# Patient Record
Sex: Female | Born: 1970
Health system: Southern US, Community
[De-identification: ages and names within clinical notes are randomized; demographics above are authoritative.]

## PROBLEM LIST (undated history)

## (undated) DIAGNOSIS — G43909 Migraine, unspecified, not intractable, without status migrainosus: Secondary | ICD-10-CM

## (undated) DIAGNOSIS — F32A Depression, unspecified: Secondary | ICD-10-CM

## (undated) DIAGNOSIS — F329 Major depressive disorder, single episode, unspecified: Secondary | ICD-10-CM

## (undated) DIAGNOSIS — K649 Unspecified hemorrhoids: Secondary | ICD-10-CM

## (undated) DIAGNOSIS — K219 Gastro-esophageal reflux disease without esophagitis: Secondary | ICD-10-CM

## (undated) DIAGNOSIS — F419 Anxiety disorder, unspecified: Secondary | ICD-10-CM

## (undated) DIAGNOSIS — M1990 Unspecified osteoarthritis, unspecified site: Secondary | ICD-10-CM

## (undated) HISTORY — DX: Unspecified osteoarthritis, unspecified site: M19.90

## (undated) HISTORY — DX: Migraine, unspecified, not intractable, without status migrainosus: G43.909

## (undated) HISTORY — DX: Depression, unspecified: F32.A

## (undated) HISTORY — DX: Major depressive disorder, single episode, unspecified: F32.9

## (undated) HISTORY — PX: CERVICAL FUSION: SHX112

## (undated) HISTORY — DX: Unspecified hemorrhoids: K64.9

## (undated) HISTORY — DX: Gastro-esophageal reflux disease without esophagitis: K21.9

## (undated) HISTORY — DX: Anxiety disorder, unspecified: F41.9

---

## 2000-01-29 HISTORY — PX: TUBAL LIGATION: SHX77

## 2002-01-28 HISTORY — PX: INCONTINENCE SURGERY: SHX676

## 2004-04-18 ENCOUNTER — Ambulatory Visit: Payer: Self-pay | Admitting: Unknown Physician Specialty

## 2004-09-09 ENCOUNTER — Emergency Department: Payer: Self-pay | Admitting: Emergency Medicine

## 2005-02-04 ENCOUNTER — Ambulatory Visit: Payer: Self-pay | Admitting: Pain Medicine

## 2005-03-28 ENCOUNTER — Ambulatory Visit: Payer: Self-pay | Admitting: Pain Medicine

## 2005-03-28 ENCOUNTER — Emergency Department: Payer: Self-pay | Admitting: Internal Medicine

## 2005-04-10 ENCOUNTER — Ambulatory Visit: Payer: Self-pay | Admitting: Physician Assistant

## 2005-04-18 ENCOUNTER — Ambulatory Visit: Payer: Self-pay | Admitting: Pain Medicine

## 2005-05-09 ENCOUNTER — Ambulatory Visit: Payer: Self-pay | Admitting: Physician Assistant

## 2005-05-28 ENCOUNTER — Ambulatory Visit: Payer: Self-pay | Admitting: Pain Medicine

## 2005-06-12 ENCOUNTER — Ambulatory Visit: Payer: Self-pay | Admitting: Pain Medicine

## 2005-06-25 ENCOUNTER — Ambulatory Visit: Payer: Self-pay | Admitting: Pain Medicine

## 2005-07-08 ENCOUNTER — Ambulatory Visit: Payer: Self-pay | Admitting: Pain Medicine

## 2005-07-15 ENCOUNTER — Ambulatory Visit: Payer: Self-pay | Admitting: Pain Medicine

## 2005-08-12 ENCOUNTER — Ambulatory Visit: Payer: Self-pay | Admitting: Pain Medicine

## 2007-10-06 ENCOUNTER — Ambulatory Visit: Payer: Self-pay

## 2010-01-28 LAB — HM PAP SMEAR

## 2011-10-15 ENCOUNTER — Ambulatory Visit (INDEPENDENT_AMBULATORY_CARE_PROVIDER_SITE_OTHER): Payer: BC Managed Care – PPO | Admitting: Family Medicine

## 2011-10-15 ENCOUNTER — Encounter: Payer: Self-pay | Admitting: *Deleted

## 2011-10-15 ENCOUNTER — Encounter: Payer: Self-pay | Admitting: Family Medicine

## 2011-10-15 VITALS — BP 120/74 | HR 74 | Temp 98.4°F | Ht 64.25 in | Wt 198.0 lb

## 2011-10-15 DIAGNOSIS — G43109 Migraine with aura, not intractable, without status migrainosus: Secondary | ICD-10-CM

## 2011-10-15 DIAGNOSIS — Z23 Encounter for immunization: Secondary | ICD-10-CM

## 2011-10-15 DIAGNOSIS — R413 Other amnesia: Secondary | ICD-10-CM

## 2011-10-15 MED ORDER — PROMETHAZINE HCL 25 MG PO TABS: 12.5000 mg | ORAL_TABLET | Freq: Three times a day (TID) | ORAL | Status: DC | PRN

## 2011-10-15 MED ORDER — CYCLOBENZAPRINE HCL 10 MG PO TABS: 10.0000 mg | ORAL_TABLET | Freq: Three times a day (TID) | ORAL | Status: DC | PRN

## 2011-10-15 NOTE — Patient Instructions (Signed)
Take the phenergan and flexeril at the onset of a migraine (both can make you drowsy).  Use the imitrex if needed.   We'll be in touch after I can review your old records.   Take care.

## 2011-10-17 ENCOUNTER — Encounter: Payer: Self-pay | Admitting: Family Medicine

## 2011-10-17 ENCOUNTER — Telehealth: Payer: Self-pay | Admitting: Family Medicine

## 2011-10-17 DIAGNOSIS — R413 Other amnesia: Secondary | ICD-10-CM | POA: Insufficient documentation

## 2011-10-17 DIAGNOSIS — G43109 Migraine with aura, not intractable, without status migrainosus: Secondary | ICD-10-CM | POA: Insufficient documentation

## 2011-10-17 NOTE — Assessment & Plan Note (Signed)
D/w pt about smoking and caffeine, needs to quit both.  Will add on phenergan for nausea and flexeril if she starts to have a migraine.  Use imitrex if not relieved.  She understood.  Continue current meds o/w. >30 min spent with face to face with patient, >50% counseling and/or coordinating care. Will review old records.

## 2011-10-17 NOTE — Telephone Encounter (Signed)
Call pt.  She had a neg pap in 2012.  She needs further labs for the memory changes.  I'll await those.  If wnl, then we'll need to consider if the memory changes are related to migraines or other cause.  I would set up a visit after the labs are done so we can discuss the memory changes in detail.  I may need to get extra help for her, ie refer locally to neuro.  We can talk about this at the OV.  Prev MRI brain wnl in old records.

## 2011-10-17 NOTE — Progress Notes (Signed)
New pt to est care. Migraines, started years ago.  +Aura, photo/phonophobia, NAV, throbbing.  On proph meds with prn imitrex.  ~6 migraines a month.  Smokes, drinks caffeine daily.   Mild memory changes per patient.  Still driving, working, but she didn't know the cause for this.  See plan.    Sig social upheaval.  Father is ill, 1st husband died.  Currently separated from second husband.  3 kids.    PMH and SH reviewed  ROS: See HPI, otherwise noncontributory.  Meds, vitals, and allergies reviewed.   GEN: nad, alert and oriented HEENT: mucous membranes moist NECK: supple w/o LA CV: rrr.  no murmur PULM: ctab, no inc wob ABD: soft, +bs EXT: no edema SKIN: no acute rash CN 2-12 wnl B, S/S/DTR wnl x4

## 2011-10-17 NOTE — Addendum Note (Signed)
Addended by: Joaquim Nam on: 10/17/2011 08:02 AM   Modules accepted: Orders

## 2011-10-17 NOTE — Telephone Encounter (Signed)
Patient advised and scheduled for labs on Friday at 10:00.  Please order labs if not already done.

## 2011-10-17 NOTE — Assessment & Plan Note (Addendum)
A&O, will review old records and then contact patient.  She agrees.   Addendum- With prev neg MRI brain and recent TSH/CMET/CBC wnl.  Would check B12/folate/RPR.  If wnl, consider other cause- migraine related, pseudodementia, etc.  We may need to refer to neuro.

## 2011-10-18 ENCOUNTER — Other Ambulatory Visit (INDEPENDENT_AMBULATORY_CARE_PROVIDER_SITE_OTHER): Payer: BC Managed Care – PPO

## 2011-10-18 DIAGNOSIS — R413 Other amnesia: Secondary | ICD-10-CM

## 2011-10-18 NOTE — Telephone Encounter (Signed)
Already ordered

## 2011-10-19 LAB — RPR

## 2011-10-21 ENCOUNTER — Encounter: Payer: Self-pay | Admitting: *Deleted

## 2011-10-23 ENCOUNTER — Encounter: Payer: Self-pay | Admitting: Family Medicine

## 2011-10-23 ENCOUNTER — Ambulatory Visit (INDEPENDENT_AMBULATORY_CARE_PROVIDER_SITE_OTHER): Payer: BC Managed Care – PPO | Admitting: Family Medicine

## 2011-10-23 VITALS — BP 128/66 | HR 71 | Temp 97.4°F | Wt 201.2 lb

## 2011-10-23 DIAGNOSIS — G43109 Migraine with aura, not intractable, without status migrainosus: Secondary | ICD-10-CM

## 2011-10-23 DIAGNOSIS — R413 Other amnesia: Secondary | ICD-10-CM

## 2011-10-23 NOTE — Assessment & Plan Note (Signed)
ddx includes ADD, depression, fatigue.  Primary memory disorder is less likely.  I'd like neuro input esp given the migraines that aren't improving.  She never had formal ADD testing and this may be useful (ie if neuro agrees with need for neuropsychological testing). ddx d/w pt.  She agrees with plan.  No change in meds now.  >25 min spent with face to face with patient, >50% counseling and/or coordinating care

## 2011-10-23 NOTE — Assessment & Plan Note (Signed)
Not improved, would refer to neuro.

## 2011-10-23 NOTE — Progress Notes (Signed)
Possibly some dec in pain with flexeril and promethazine with migraines, but this isn't clear to patient.  She has continued on her other prev meds.    Memory changes first noted in the last 12 months.  She'll have to write notes to herself to compensate.  She has locked her keys in her car accidentally.  She has trouble remembering where she put her bank card.  She got lost going to [facility] yesterday (history per patient- pt was on the way to the hospital to see her dying sister, who possibly had a CVA after a fracture).  Prev MRI/MRA unremarkable per old records.  She isn't making mistakes at work.  She is worried about her memory.   She's had mult speeding tickets over the years.  Has insomnia related to worry about completing tasks.  Has to use lists to compensate.  Concentration and memory are worse per patient when she is fatigued.  She can't drive with the radio on as it is distracting.    Meds, vitals, and allergies reviewed.   ROS: See HPI.  Otherwise, noncontributory.  MMSE 26/30.  -3 for concentration, -1 for recall.   nad Affect- slightly worried, fatigued appearing Speech and judgement wnl Remainder of exam deferred.

## 2011-10-23 NOTE — Patient Instructions (Addendum)
Don't change your meds for now.  Shirlee Limerick will call about your referral.

## 2011-11-15 ENCOUNTER — Ambulatory Visit: Payer: Self-pay | Admitting: Neurology

## 2011-11-15 ENCOUNTER — Encounter: Payer: Self-pay | Admitting: Family Medicine

## 2011-11-15 ENCOUNTER — Ambulatory Visit (INDEPENDENT_AMBULATORY_CARE_PROVIDER_SITE_OTHER): Payer: BC Managed Care – PPO | Admitting: Family Medicine

## 2011-11-15 VITALS — BP 112/74 | HR 82 | Temp 98.3°F | Wt 203.0 lb

## 2011-11-15 DIAGNOSIS — J069 Acute upper respiratory infection, unspecified: Secondary | ICD-10-CM

## 2011-11-15 DIAGNOSIS — G43109 Migraine with aura, not intractable, without status migrainosus: Secondary | ICD-10-CM

## 2011-11-15 DIAGNOSIS — R413 Other amnesia: Secondary | ICD-10-CM

## 2011-11-15 NOTE — Patient Instructions (Addendum)
Gargle with salt water and take plain guaifenesin for your cold if needed.  Don't take anything with -D or -DM in the name.   I'll await the neuro notes.   Take care.

## 2011-11-15 NOTE — Progress Notes (Signed)
She had an EEG today via neuro- Dr. Malvin Johns. I don't have the results yet.  Dr. Malvin Johns wanted pt on trazodone (now with some improvement of sleep using trazodone) with zonegran with OTC butterbur 75mg  twice a day for migraines.  She has f/u with neuro in another 2 weeks.   Has had 4 "bad" migraines this month already.  Using imitrex prn, along with flexeril and promethazine.    She has talked to her manager about her situation at work.  She is having trouble with concentration and it isn't clear if the above symptoms are related.   Recently with mild ST.  Daughter has a cold. No fevers. No cough.   Meds, vitals, and allergies reviewed.   ROS: See HPI.  Otherwise, noncontributory.  GEN: nad, alert and oriented HEENT: mucous membranes moist, tm w/o erythema, nasal exam w/o erythema, scant clear discharge noted,  OP with cobblestoning NECK: supple w/o LA CV: rrr.   PULM: ctab, no inc wob EXT: no edema SKIN: no acute rash

## 2011-11-16 ENCOUNTER — Telehealth: Payer: Self-pay | Admitting: Family Medicine

## 2011-11-16 DIAGNOSIS — J069 Acute upper respiratory infection, unspecified: Secondary | ICD-10-CM | POA: Insufficient documentation

## 2011-11-16 NOTE — Assessment & Plan Note (Signed)
I'll await the neuro recs.  It appears that butterbur would be reasonable to try.  I'll defer to neuro.

## 2011-11-16 NOTE — Assessment & Plan Note (Signed)
Likely viral, nontoxic, supportive tx in meantime.

## 2011-11-16 NOTE — Telephone Encounter (Signed)
Call pt.  It would be reasonable to try the butterbur per neuro recs for the headaches.  Thanks.

## 2011-11-16 NOTE — Assessment & Plan Note (Signed)
I'll await neuro results.  It is unclear if she truly has concentration/memory changes as a primary problem of if this is a secondary symptom to another condition.

## 2011-11-18 NOTE — Telephone Encounter (Signed)
Patient advised.

## 2011-12-30 ENCOUNTER — Encounter: Payer: Self-pay | Admitting: Family Medicine

## 2011-12-30 ENCOUNTER — Ambulatory Visit (INDEPENDENT_AMBULATORY_CARE_PROVIDER_SITE_OTHER): Payer: BC Managed Care – PPO | Admitting: Family Medicine

## 2011-12-30 VITALS — BP 102/64 | HR 78 | Temp 98.2°F | Wt 199.0 lb

## 2011-12-30 DIAGNOSIS — R413 Other amnesia: Secondary | ICD-10-CM

## 2011-12-30 DIAGNOSIS — G43109 Migraine with aura, not intractable, without status migrainosus: Secondary | ICD-10-CM

## 2011-12-30 NOTE — Progress Notes (Signed)
She has had some improvement in headaches since she is on butterbur per neuro recs with less HA overall.   She still has some concern about confusion/memory.  She attributes it to the zonegran.  She is asking about options for alternatives.    She had prev been topamax, started at high dose.  She reports prev being on BB, but that was years ago.  Lyrica didn't help and neither did neurontin.  Depakote didn't help and she had sig weight gain.    HA were worse on lower dose of zonegran but she felt better overall and "like the fog had lifted."    Meds, vitals, and allergies reviewed.   ROS: See HPI.  Otherwise, noncontributory.  nad ncat Mmm rrr ctab Speech wnl Motor function grossly wnl x4 Gait wnl

## 2011-12-30 NOTE — Patient Instructions (Addendum)
Let me think about the options for your meds and I'll be in touch.

## 2011-12-31 ENCOUNTER — Encounter: Payer: Self-pay | Admitting: *Deleted

## 2011-12-31 ENCOUNTER — Telehealth: Payer: Self-pay | Admitting: Family Medicine

## 2011-12-31 MED ORDER — TOPIRAMATE 25 MG PO TABS: ORAL_TABLET | ORAL | Status: DC

## 2011-12-31 NOTE — Telephone Encounter (Signed)
Patient advised.  She also asked me to send these instructions to her in letter form.  Letter mailed.  She asks that the Topamax be sent to [facility] [address] in [place].

## 2011-12-31 NOTE — Assessment & Plan Note (Signed)
See migraine discussion.  

## 2011-12-31 NOTE — Telephone Encounter (Signed)
LM at work for her to return my call.

## 2011-12-31 NOTE — Assessment & Plan Note (Signed)
At this point would start and then increase topamax by 25mg  every 2 weeks until migraines controlled, max 200mg  a day.  Would dec zonisamide by 100mg  every 2 weeks.  Will take 12 weeks to get off zonisamide.

## 2011-12-31 NOTE — Telephone Encounter (Signed)
Was sent to [facility]. Thanks.

## 2011-12-31 NOTE — Telephone Encounter (Signed)
Call pt.  At this point would start and then increase topamax by 25mg  every 2 weeks until migraines controlled, max 200mg  a day.  Don't go up quicker than that.  Would concurrently decrease zonisamide by 100mg  every 2 weeks.  Don't decrease quicker than that.  Will take 12 weeks to get off zonisamide.    Note that topamax is pregnancy category D, ie notify MD before considering pregnancy to discuss options.  Have her call back with update in about 1 month, sooner if need.  Thanks.

## 2012-02-07 ENCOUNTER — Encounter: Payer: Self-pay | Admitting: Family Medicine

## 2012-02-07 ENCOUNTER — Ambulatory Visit (INDEPENDENT_AMBULATORY_CARE_PROVIDER_SITE_OTHER): Payer: BC Managed Care – PPO | Admitting: Family Medicine

## 2012-02-07 VITALS — BP 104/70 | HR 68 | Temp 98.5°F | Wt 195.8 lb

## 2012-02-07 DIAGNOSIS — M549 Dorsalgia, unspecified: Secondary | ICD-10-CM

## 2012-02-07 LAB — POCT URINALYSIS DIPSTICK
Bilirubin, UA: NEGATIVE
Blood, UA: NEGATIVE
Glucose, UA: NEGATIVE
Ketones, UA: NEGATIVE
Spec Grav, UA: 1.02
pH, UA: 6.5

## 2012-02-07 NOTE — Patient Instructions (Addendum)
This looks like a pulled muscle.  Take tylenol and flexeril as needed.  Use a heating pad.  Take care.

## 2012-02-07 NOTE — Progress Notes (Signed)
She is in the midst of topamax up titration and zonegran dec titration.  She doesn't have frequent migraines and her memory seems to be improving.  She has more energy and is sleeping better.   Now with back pain on R side, radiating around the R side into the lateral abdomen near the RUQ but not in the RUQ itself.  U/a is unremarkable today.  No FCVD.  Started ~5 days ago.  She initially thought she slept wrong, then attributed it to constipation.  Some nausea.  No dysuria.  No blood in stool.  No L sided sx. Still has GB, never had renal stones.    Meds, vitals, and allergies reviewed.   ROS: See HPI.  Otherwise, noncontributory.  nad ncat Mmm rrr ctab Back w/o midline pain No rash abd soft, not ttp, no RUQ pain, murphy's neg Back with R sided paraspinal muscle tenderness w/o midline pain.  No cva pain Ext w/o edema

## 2012-02-09 DIAGNOSIS — M5416 Radiculopathy, lumbar region: Secondary | ICD-10-CM | POA: Insufficient documentation

## 2012-02-09 NOTE — Assessment & Plan Note (Signed)
Looks to be MSK and not abdominal.  Likely muscle strain.  Take flexeril, local heat, tylenol and f/u prn.  Anatomy d/w pt. She agrees.

## 2012-02-28 ENCOUNTER — Telehealth: Payer: Self-pay | Admitting: Family Medicine

## 2012-02-28 MED ORDER — VALACYCLOVIR HCL 1 G PO TABS: 2000.0000 mg | ORAL_TABLET | Freq: Two times a day (BID) | ORAL | Status: DC

## 2012-02-28 NOTE — Telephone Encounter (Signed)
Sent. Thanks.   

## 2012-02-28 NOTE — Telephone Encounter (Signed)
Pt requesting rx for Valtrex for herpes flare up to be sent to  [facility], [place].

## 2012-04-08 ENCOUNTER — Other Ambulatory Visit: Payer: Self-pay

## 2012-04-08 MED ORDER — TOPIRAMATE 100 MG PO TABS: 100.0000 mg | ORAL_TABLET | Freq: Two times a day (BID) | ORAL | Status: DC

## 2012-04-08 MED ORDER — ELETRIPTAN HYDROBROMIDE 40 MG PO TABS: 40.0000 mg | ORAL_TABLET | ORAL | Status: DC | PRN

## 2012-04-08 NOTE — Telephone Encounter (Signed)
Pt left v/m requesting refill topamax to [facility]. Pt said presently she is taking topamax 100 mg twice a day. Pt said has taken Imitrex when does get migraine but usually has to take more than one Imitrex and pt would like to try Relpax to [facility].Please advise.

## 2012-04-08 NOTE — Telephone Encounter (Signed)
I changed the rx to 100mg  pills of topamax.  That should be easier to take.  rx sent for that and relpax.  Call back with update if not effective.  Thanks.

## 2012-04-24 ENCOUNTER — Other Ambulatory Visit: Payer: Self-pay | Admitting: *Deleted

## 2012-04-24 ENCOUNTER — Encounter: Payer: Self-pay | Admitting: Family Medicine

## 2012-04-24 ENCOUNTER — Ambulatory Visit (INDEPENDENT_AMBULATORY_CARE_PROVIDER_SITE_OTHER): Payer: BC Managed Care – PPO | Admitting: Family Medicine

## 2012-04-24 VITALS — BP 110/70 | HR 79 | Temp 98.3°F | Wt 196.5 lb

## 2012-04-24 DIAGNOSIS — F329 Major depressive disorder, single episode, unspecified: Secondary | ICD-10-CM

## 2012-04-24 DIAGNOSIS — F3289 Other specified depressive episodes: Secondary | ICD-10-CM

## 2012-04-24 DIAGNOSIS — G43109 Migraine with aura, not intractable, without status migrainosus: Secondary | ICD-10-CM

## 2012-04-24 DIAGNOSIS — F32A Depression, unspecified: Secondary | ICD-10-CM

## 2012-04-24 MED ORDER — CITALOPRAM HYDROBROMIDE 20 MG PO TABS: 20.0000 mg | ORAL_TABLET | Freq: Every day | ORAL | Status: DC

## 2012-04-24 NOTE — Telephone Encounter (Signed)
[facility] called problems with computer earlier; Medication given verbally to pharmacy as instructed.

## 2012-04-24 NOTE — Progress Notes (Signed)
HA- tapered off zonegran and now up on topamax.  She is less foggy but still leaving herself notes as reminders.  She has the tingling as expected in her hands. Migraines are under fair control, having about 2-3 per month.  If she catches the HA early on, "then they aren't as bad."  She realized that she may be getting rebound HA from ibuprofen. This doesn't seem to happen as much with aleve.  She's having to take it episodically during the week.    Husband has been driving long distances and has been out of the house for extended periods; this has been a significant change.  She's in the middle of a house foreclosure, mult family members have been ill.  She is still functional at work.    More tearful.  Sleep isn't restorative.  Restless leg sx have increased; prev dx'd during sleep study. No SI/HI.  She doesn't want to go back through counseling.    Meds, vitals, and allergies reviewed.   ROS: See HPI.  Otherwise, noncontributory.  nad ncat Mmm rrr ctab CN 2-12 wnl B, S/S/DTR wnl x4 Speech and judgement intact

## 2012-04-24 NOTE — Telephone Encounter (Signed)
Patient given info at appt.

## 2012-04-24 NOTE — Patient Instructions (Signed)
Add on the citalopram and see if that helps your mood and the headaches.  If not tolerated or if not improving, then let us know.

## 2012-04-24 NOTE — Telephone Encounter (Signed)
Resent

## 2012-04-26 DIAGNOSIS — F32A Depression, unspecified: Secondary | ICD-10-CM | POA: Insufficient documentation

## 2012-04-26 DIAGNOSIS — F329 Major depressive disorder, single episode, unspecified: Secondary | ICD-10-CM | POA: Insufficient documentation

## 2012-04-26 NOTE — Assessment & Plan Note (Signed)
Improved with topamax.  Tolerating the med except for expected tingling in her hands.  Continue as is.

## 2012-04-26 NOTE — Assessment & Plan Note (Signed)
With depressed mood, likely exacerbated by concurrent life events. Prev intolerant of prozac.  Would be reasonable to try another SSRI for depressive sx and for migraine proph potential. She agrees. Okay for outpatient f/u. >25 min spent with face to face with patient, >50% counseling and/or coordinating care. Routine instructions given and understood by patient.

## 2012-05-22 ENCOUNTER — Other Ambulatory Visit: Payer: Self-pay

## 2012-05-22 NOTE — Telephone Encounter (Signed)
Pt left note requesting refill valtrex to [facility].Please advise.

## 2012-05-23 ENCOUNTER — Other Ambulatory Visit: Payer: Self-pay | Admitting: Family Medicine

## 2012-05-24 MED ORDER — VALACYCLOVIR HCL 1 G PO TABS: 2000.0000 mg | ORAL_TABLET | Freq: Two times a day (BID) | ORAL | Status: DC

## 2012-05-24 NOTE — Telephone Encounter (Signed)
Sent!

## 2012-05-25 ENCOUNTER — Other Ambulatory Visit: Payer: Self-pay | Admitting: *Deleted

## 2012-05-25 MED ORDER — VALACYCLOVIR HCL 1 G PO TABS: 2000.0000 mg | ORAL_TABLET | Freq: Two times a day (BID) | ORAL | Status: AC

## 2012-05-28 ENCOUNTER — Encounter: Payer: Self-pay | Admitting: Family Medicine

## 2012-05-28 ENCOUNTER — Ambulatory Visit (INDEPENDENT_AMBULATORY_CARE_PROVIDER_SITE_OTHER): Payer: BC Managed Care – PPO | Admitting: Family Medicine

## 2012-05-28 VITALS — BP 108/66 | HR 74 | Temp 98.9°F | Wt 196.5 lb

## 2012-05-28 DIAGNOSIS — R51 Headache: Secondary | ICD-10-CM

## 2012-05-28 DIAGNOSIS — G43109 Migraine with aura, not intractable, without status migrainosus: Secondary | ICD-10-CM

## 2012-05-28 MED ORDER — KETOROLAC TROMETHAMINE 30 MG/ML IJ SOLN
30.0000 mg | Freq: Once | INTRAMUSCULAR | Status: AC
  Administered 2012-05-28: 30 mg via INTRAMUSCULAR

## 2012-05-28 MED ORDER — PROMETHAZINE HCL 25 MG/ML IJ SOLN
25.0000 mg | Freq: Once | INTRAMUSCULAR | Status: AC
  Administered 2012-05-28: 25 mg via INTRAMUSCULAR

## 2012-05-28 MED ORDER — KETOROLAC TROMETHAMINE 30 MG/ML IJ SOLN: 30.0000 mg | Freq: Once | INTRAMUSCULAR | Status: DC

## 2012-05-28 NOTE — Assessment & Plan Note (Signed)
Going on for days.  Has a driver here.  No acute/emergent neuro changes.  Given IM phenergan and toradol.  Continue phenergan later today orally at home.  She'll update [place] tomorrow if needed.  We can consider pred trial if not improved.  Will await update from patient.  She agrees.

## 2012-05-28 NOTE — Patient Instructions (Addendum)
Get some rest at home in a dark room and let me know tomorrow if the injections today didn't help. I would continue to take the phenergan every 8 hours at least for today.  Take care.

## 2012-05-28 NOTE — Progress Notes (Signed)
Overall she was improved off zonisamide and on topamax.  Her HA- this episode- has been waxing and waning for a few days now. She hasn't been able to sleep.  Throbbing HA, "like drums pounding".  Vomiting. Aura noted. Used home meds, flexeril and relpax, on topamax.  Used phenergan. Last vomited about 1AM today.  Has a driver here today.  Last ate and held the food down last night. Last relpax dose was last night about 10 PM.  Photo/phonophobia noted. Overly sensitive to smells. She hasn't smoked in a few days.  The sx are typical for a migraine but worse than normal. Hasn't taken any nsaids today.    Meds, vitals, and allergies reviewed.   ROS: See HPI.  Otherwise, noncontributory.  GEN: nad, alert and oriented but look uncomfortable.  HEENT: mucous membranes moist, TM wnl, nasal and op exam wnl, PERRL, EOMI NECK: supple w/o LA CV: rrr.  PULM: ctab, no inc wob ABD: soft, +bs EXT: no edema SKIN: no acute rash CN 2-12 wnl B, S/S/DTR wnl x4

## 2012-06-05 ENCOUNTER — Encounter: Payer: Self-pay | Admitting: Family Medicine

## 2012-06-05 ENCOUNTER — Ambulatory Visit (INDEPENDENT_AMBULATORY_CARE_PROVIDER_SITE_OTHER): Payer: BC Managed Care – PPO | Admitting: Family Medicine

## 2012-06-05 VITALS — BP 102/64 | HR 63 | Temp 98.7°F | Wt 194.0 lb

## 2012-06-05 DIAGNOSIS — G43109 Migraine with aura, not intractable, without status migrainosus: Secondary | ICD-10-CM

## 2012-06-05 DIAGNOSIS — G43909 Migraine, unspecified, not intractable, without status migrainosus: Secondary | ICD-10-CM

## 2012-06-05 NOTE — Progress Notes (Signed)
Migraine f/u.  Seen last Thursday.  She improved but it took ~3 days from the OV to notice a big change "when the fog lifted."  The pain started to improve by the day after the OV.  She doesn't see much improvement from the flexeril early in HA course.  She started to have another HA last night. She took a flexeril but she didn't improve then.  It was unclear if the flexeril made the HA worse or if she was going to get worse anyway.  She is having some breakthrough HA in spite of her current meds.  She's had to take aleve at least 3 times this week. 3 per week appears to be her new baseline.  The foggy sensation is some better on the topamax compared to the zonisamide.  She notes that she'll occ get "tongue tied" and this is newer since she was on the topamax.    She has stopped working.  She was laid off 05/13/12.  She is considering her options and taking some odd jobs.   Meds, vitals, and allergies reviewed.   ROS: See HPI.  Otherwise, noncontributory.  GEN: nad, alert and oriented HEENT: mucous membranes moist NECK: supple w/o LA CV: rrr PULM: ctab, no inc wob ABD: soft, +bs EXT: no edema SKIN: no acute rash CN 2-12 wnl B, S/S/DTR wnl x4

## 2012-06-05 NOTE — Patient Instructions (Addendum)
Go to the lab on the way out.  We'll contact you with your lab report.  Stop the flexeril for now.  We'll be in touch about the topamax dose after I see your labs.   Take care.

## 2012-06-06 LAB — COMPREHENSIVE METABOLIC PANEL
AST: 12 U/L (ref 0–37)
Albumin: 4.1 g/dL (ref 3.5–5.2)
BUN: 19 mg/dL (ref 6–23)
CO2: 24 mEq/L (ref 19–32)
Calcium: 9 mg/dL (ref 8.4–10.5)
Chloride: 108 mEq/L (ref 96–112)
Glucose, Bld: 80 mg/dL (ref 70–99)
Potassium: 4.4 mEq/L (ref 3.5–5.3)

## 2012-06-07 ENCOUNTER — Other Ambulatory Visit: Payer: Self-pay | Admitting: Family Medicine

## 2012-06-07 MED ORDER — TOPIRAMATE 25 MG PO TABS: ORAL_TABLET | ORAL | Status: DC

## 2012-06-07 NOTE — Assessment & Plan Note (Signed)
Will inc topamax to 225 then 250mg  a day to see if that will help. She'll avoid use of flexeril for now and use triptan prn for HA. She agrees.  See notes on labs.  >25 min spent with face to face with patient, >50% counseling and/or coordinating care.

## 2012-08-13 ENCOUNTER — Other Ambulatory Visit: Payer: Self-pay | Admitting: *Deleted

## 2012-08-13 MED ORDER — CITALOPRAM HYDROBROMIDE 20 MG PO TABS: 20.0000 mg | ORAL_TABLET | Freq: Every day | ORAL | Status: DC

## 2012-09-12 ENCOUNTER — Other Ambulatory Visit: Payer: Self-pay | Admitting: Family Medicine

## 2012-09-14 NOTE — Telephone Encounter (Signed)
Electronic refill request.  Please advise. 

## 2012-09-14 NOTE — Telephone Encounter (Signed)
Sent!

## 2012-10-22 ENCOUNTER — Encounter: Payer: Self-pay | Admitting: Family Medicine

## 2012-10-22 ENCOUNTER — Ambulatory Visit (INDEPENDENT_AMBULATORY_CARE_PROVIDER_SITE_OTHER): Payer: BC Managed Care – PPO | Admitting: Family Medicine

## 2012-10-22 VITALS — BP 110/70 | HR 77 | Temp 98.4°F | Ht 64.25 in | Wt 204.5 lb

## 2012-10-22 DIAGNOSIS — F32A Depression, unspecified: Secondary | ICD-10-CM

## 2012-10-22 DIAGNOSIS — G43901 Migraine, unspecified, not intractable, with status migrainosus: Secondary | ICD-10-CM

## 2012-10-22 DIAGNOSIS — F329 Major depressive disorder, single episode, unspecified: Secondary | ICD-10-CM

## 2012-10-22 DIAGNOSIS — G43109 Migraine with aura, not intractable, without status migrainosus: Secondary | ICD-10-CM

## 2012-10-22 DIAGNOSIS — F3289 Other specified depressive episodes: Secondary | ICD-10-CM

## 2012-10-22 MED ORDER — PROMETHAZINE HCL 25 MG PO TABS: 25.0000 mg | ORAL_TABLET | Freq: Four times a day (QID) | ORAL | Status: DC | PRN

## 2012-10-22 MED ORDER — AMITRIPTYLINE HCL 25 MG PO TABS: 25.0000 mg | ORAL_TABLET | Freq: Every day | ORAL | Status: DC

## 2012-10-22 NOTE — Progress Notes (Signed)
Nature conservation officer at [facility] [address] [place] [address] Phone: [phone] Fax: [phone]  Date:  10/22/2012   Name:  Lesly Pontarelli   DOB:  05-26-1970   MRN:  [id number] Gender: female Age: 42 y.o.  Primary Physician:  Crawford Givens, MD  Evaluating MD: Hannah Beat, MD   Chief Complaint: Migraine   History of Present Illness:  Emberleigh Reily is a 42 y.o. pleasant patient who presents with the following:  This is the first time I have evaluated the patient who is a patient of Dr. Para March and this marks her 9th office visit for evaluation and treatment of migraine in the last 12 months.  She has an extensive migraine history since childhood and is currently taking Topamax 125 mg bid. She is having financial difficulties right now, has lost her insurance, and has been unable to get Relpax. She does have 2 more injectable doses of triptan left.  Migraine: migraines have gotten out of hand some. Has had 3-4 migraines a month with severe headache, nausea, photophobia, and phonophobia, and they will last anywhere from 4-7 days each. During this time, she is hardly able to leave a dark room. In total from about 12-21 days of the month she is terribly debilitated and stays in a dark room.  In the last month, her face has been hurting. Now feels like face is moving. Eyes were moving. Forehead moving, feels like face is moving. Then having. Light and sound are bothering her a lot. Touching her head is sensitive. Hurts to brush her hair.   Her mother has trigeminal neuralgia, and she worries that she has been developing this.   She has had extensive evaluations in the past with MRI's, MRA's, and she has been seen by [facility] neurology, Dr. Malvin Johns at the [facility], and the [facility] in [place]. She was unable to tolerate Depakote, she failed titrations of Inderal, reports no help from TCA's and flexeril.   Patient Active Problem List   Diagnosis Date Noted  . Depression 04/26/2012  . Back pain 02/09/2012  . Migraine with aura 10/17/2011  . Memory changes 10/17/2011    Past Medical History  Diagnosis Date  . Depression   . Migraines     with aura    Past Surgical History  Procedure Laterality Date  . Cervical fusion      History   Social History  . Marital Status: Married    Spouse Name: N/A    Number of Children: 3  . Years of Education: N/A   Occupational History  . CMA    Social History Main Topics  . Smoking status: Current Every Day Smoker -- 0.10 packs/day for 3 years    Types: Cigarettes  . Smokeless tobacco: Never Used  . Alcohol Use: No  . Drug Use: No  . Sexual Activity: Not on file   Other Topics Concern  . Not on file   Social History Narrative   2 years of college.   Former CMA at [employer]   1st husband died from [employer] in 2010/06/13   Remarried    3 kids    Family History  Problem Relation Age of Onset  . Peripheral vascular disease Father   . Stroke Sister     possible CVA    Allergies  Allergen Reactions  . Lyrica [Pregabalin]     Migraine  . Prozac [Fluoxetine Hcl]     Fatigue   . Remeron [Mirtazapine]     "  felt like a zombie"    Medication list has been reviewed and updated.  Outpatient Prescriptions Prior to Visit  Medication Sig Dispense Refill  . citalopram (CELEXA) 20 MG tablet Take 1 tablet (20 mg total) by mouth daily.  30 tablet  3  . Cranberry 125 MG TABS Take 1 tablet by mouth as needed.       . promethazine (PHENERGAN) 25 MG tablet Take 0.5-1 tablets (12.5-25 mg total) by mouth every 8 (eight) hours as needed for nausea.  30 tablet  2  . topiramate (TOPAMAX) 100 MG tablet Take 1 tablet (100 mg total) by mouth 2 (two) times daily.  60 tablet  12  . topiramate (TOPAMAX) 25 MG tablet Take 2 a day (along with 2x100mg  tabs) for 250mg  per day total.  60 tablet  5  . valACYclovir (VALTREX) 1000 MG tablet Take 2 tablets (2,000 mg total) by mouth 2 (two)  times daily. For 1 day.  Repeat as needed with future episodes.  8 tablet  1  . topiramate (TOPAMAX) 25 MG tablet Take 1 tab a day for 2 weeks, then increase to 2 a day.  For total of 225 and then 250mg  a day.      . cyclobenzaprine (FLEXERIL) 10 MG tablet Take 1 tablet (10 mg total) by mouth 3 (three) times daily as needed (migraine- sedation caution).  30 tablet  2  . eletriptan (RELPAX) 40 MG tablet One tablet by mouth at onset of headache. May repeat in 2 hours if headache persists or recurs.  10 tablet  2   No facility-administered medications prior to visit.    Review of Systems:  Anxious, depressed somwhat, neuro symptoms as above. No fever, chills, or chest pain. O/w as above  Physical Examination: BP 110/70  Pulse 77  Temp(Src) 98.4 F (36.9 C) (Oral)  Ht 5' 4.25" (1.632 m)  Wt 204 lb 8 oz (92.761 kg)  BMI 34.83 kg/m2  LMP 10/12/2012  Ideal Body Weight: Weight in (lb) to have BMI = 25: 146.5   GEN: WDWN, NAD, Non-toxic, A & O x 3 HEENT: Atraumatic, Normocephalic. Neck supple. No masses, No LAD. Ears and Nose: No external deformity. CV: RRR, No M/G/R. No JVD. No thrill. No extra heart sounds. PULM: CTA B, no wheezes, crackles, rhonchi. No retractions. No resp. distress. No accessory muscle use. ABD: S, NT, ND, +BS. No rebound tenderness. No HSM.  EXTR: No c/c/e  Neuro: CN 2-12 grossly intact. PERRLA. EOMI. Sensation intact throughout. Str 5/5 all extremities. DTR 2+. No clonus. A and o x 4. Romberg neg. Finger nose neg. Heel -shin neg.   PSYCH: anxious. Mildly labile affect.  Assessment and Plan:  Migraine with status migrainosus - Plan: Ambulatory referral to Neurology  Migraine with aura  Depression  >75 minutes spent in face to face time with patient, >50% spent in counselling or coordination of care:  This patient is more unstable from a migraine standpoint than almost anyone I have ever examined. She has already tried far more than I ever do in my clinical  practice and has seen several neurologists. She is spending 20/30 days in a dark room in pain. I discussed this case with one of partners, as well, and we came up with few options. I thought a trial of a TCA may help with a different mechanism for prophylaxis, but she has already called this morning and was unable to tolerate this. I have no other suggestions and think this is well  beyond the scope of my care. I am asking my colleagues in Neurology to see if they will graciously help.  I will discuss the patient with her PCP when he returns. SSRI increase and potentially addition of benzo may be helpful if patient is willing.  Orders Today:  Orders Placed This Encounter  Procedures  . Ambulatory referral to Neurology    Referral Priority:  Routine    Referral Type:  Consultation    Referral Reason:  Specialty Services Required    Requested Specialty:  Neurology    Number of Visits Requested:  1    Updated Medication List: (Includes new medications, updates to list, dose adjustments) Meds ordered this encounter  Medications  . amitriptyline (ELAVIL) 25 MG tablet    Sig: Take 1 tablet (25 mg total) by mouth at bedtime.    Dispense:  30 tablet    Refill:  4  . promethazine (PHENERGAN) 25 MG tablet    Sig: Take 1 tablet (25 mg total) by mouth every 6 (six) hours as needed for nausea.    Dispense:  40 tablet    Refill:  3    Medications Discontinued: Medications Discontinued During This Encounter  Medication Reason  . topiramate (TOPAMAX) 25 MG tablet Duplicate      Signed, Indea Dearman T. Jazari Ober, MD 10/22/2012 11:11 AM

## 2012-10-22 NOTE — Patient Instructions (Addendum)
REFERRAL: GO THE THE FRONT ROOM AT THE ENTRANCE OF OUR CLINIC, NEAR CHECK IN. ASK FOR Bridget Beck. SHE WILL HELP YOU SET UP YOUR REFERRAL. DATE: TIME:  

## 2012-10-23 ENCOUNTER — Other Ambulatory Visit: Payer: Self-pay | Admitting: Family Medicine

## 2012-10-23 ENCOUNTER — Telehealth: Payer: Self-pay | Admitting: Family Medicine

## 2012-10-23 ENCOUNTER — Telehealth: Payer: Self-pay

## 2012-10-23 NOTE — Telephone Encounter (Signed)
I will d/w Dr. Patsy Lager upon my return.  I had discussed mult options with patient prev, including referral (she declined specialty referral prev as she was transitioning out of [facility] to [facility]).  Pt had not updated me about her condition after the 06/07/12 lab result.

## 2012-10-23 NOTE — Telephone Encounter (Signed)
Patient notified as instructed by telephone. 

## 2012-10-23 NOTE — Telephone Encounter (Signed)
Stop the amitryptiline.   As we discussed yesterday, she is already above the FDA approved dose of topamax for migraine, and I will not increase it.   We are trying to get her into neurology as soon as possible.

## 2012-10-23 NOTE — Telephone Encounter (Signed)
Pt seen 10/22/12; pt stated had reaction to amitriptyline; "pt was disfunctional and pt went blind with migraine". Pt said she could not see anything for 2 hours.Pt said head is very sore, it hurts to touch her head, light sensitive and nauseated. Pt is not taking any more amitriptyline. Pt request increase of Topamax.[employer].Pt request cb. In the past pt has taken Zrongram 600 mg.Zrongram became ineffective after 6 years. Stopped Zrongram and Topamax was started.

## 2012-10-23 NOTE — Telephone Encounter (Signed)
I called and got the advice of Dr. Arbutus Leas about this patient. She confirmed that doses > 200 mg of Topamax are not helpful with migraine prophylaxis. I asked her advice about options for this patient and she suggested trying Zanaflex or potentially cymbalta.   I thought this was a very good suggestion, and I called the patient to check on her. I suggested adding zanaflex, and the patient was not open to this suggestion.  She would like to discuss this with Dr. Para March. I will discuss this case with my partner on his return.  Hannah Beat, MD 10/23/2012, 10:59 AM

## 2012-10-25 ENCOUNTER — Telehealth: Payer: Self-pay | Admitting: Family Medicine

## 2012-10-25 NOTE — Telephone Encounter (Signed)
Please call pt. I d/w Dr. Patsy Lager.  I agree with Dr. Cyndie Chime plan re: neuro eval.  Would taper back down to topamax 200mg  a day.  If I had an update from the patient in the meantime about her worsening headaches, I already would have referred out and decreased the topamax back to 200mg  a day.

## 2012-10-26 NOTE — Telephone Encounter (Signed)
I would follow the prev instructions.  See below.

## 2012-10-26 NOTE — Telephone Encounter (Signed)
Pt left v/m requesting cb; pt said she needs her topamax adjusted; pt was seen in ED over weekend for h/a, was given cocktail and that helped h/a somewhat, pt's eye and cheek is twitching also.

## 2012-10-27 NOTE — Telephone Encounter (Signed)
I didn't mean to confuse the instructions.   I assumed she had stopped the amitriptyline.  I would stay off that.  I still wouldn't increase the topamax.  I would only take 200mg  a day and f/u with neuro when possible.

## 2012-10-27 NOTE — Telephone Encounter (Signed)
Patient advised.   She says she cannot take the Amitriptyline.  Patient says that after taking just one tablet, it sent her into a horrendous migraine and she ended up in the ER.  She says she does think that her Topamax needs to be adjusted one more time because she is having more headaches.   Please advise.

## 2012-10-27 NOTE — Telephone Encounter (Signed)
Patient was informed that the neurology office had tried to phone her with no answer.  The neurology # (Dr. Arbutus Leas) was given to patient [phone]

## 2012-12-03 ENCOUNTER — Other Ambulatory Visit: Payer: Self-pay

## 2012-12-16 ENCOUNTER — Other Ambulatory Visit: Payer: Self-pay | Admitting: Family Medicine

## 2012-12-16 NOTE — Telephone Encounter (Signed)
Received refill request electronically. Last office visit 10/22/12/acute. Is it okay to refill medication?

## 2012-12-16 NOTE — Telephone Encounter (Signed)
Sent!

## 2013-03-09 ENCOUNTER — Telehealth: Payer: Self-pay

## 2013-03-09 MED ORDER — SUMATRIPTAN SUCCINATE 100 MG PO TABS: 100.0000 mg | ORAL_TABLET | Freq: Once | ORAL | Status: DC | PRN

## 2013-03-09 NOTE — Telephone Encounter (Signed)
Patient notified that script has been sent to the pharmacy. ?

## 2013-03-09 NOTE — Telephone Encounter (Signed)
Sent!

## 2013-03-09 NOTE — Telephone Encounter (Signed)
Pt left v/m requesting imitrex changed from injectable to oral med and send rx to [facility]. Please advise. Pt said she can function better when taking the oral med rather than the injection. Please advise.

## 2013-03-17 ENCOUNTER — Other Ambulatory Visit: Payer: Self-pay

## 2013-03-17 DIAGNOSIS — G43909 Migraine, unspecified, not intractable, without status migrainosus: Secondary | ICD-10-CM

## 2013-03-17 NOTE — Telephone Encounter (Signed)
Pt request refill topiramate 25 mg to [facility].Please advise.

## 2013-03-17 NOTE — Telephone Encounter (Signed)
This dose had been stopped prev.

## 2013-03-24 NOTE — Telephone Encounter (Signed)
I'm not refilling the 25mg  pills.  She shouldn't be on more than 200mg  a day per prev instructions, especially since she is still having migraines at this dose anyway.  I will refill the 100mg  pills for a total of 200mg  if needed.   Neuro referral is in. Thanks.

## 2013-03-24 NOTE — Telephone Encounter (Signed)
Patient notified as instructed by telephone. Patient stated that she was never told to taper back on the medication and does not feel that she can do that because she already has break thru migraines on this dose. Patient states that she will be completely out of medication tomorrow. Patient stated that she now has insurance and can go to see a neurologist if that is what she needs to do. Patient is concerned about running out of her medication tomorrow.

## 2013-03-24 NOTE — Telephone Encounter (Addendum)
Patient notified as instructed by telephone.  Was advised by patient that she already has Topamax 100 mg pills and just needed the 25 mg. Advised patient that Rosaria Ferries will be in touch with her regarding the referral. Patient stated that she would like an appointment as soon as possible.

## 2013-03-24 NOTE — Telephone Encounter (Signed)
Per EMR  She was given instructions on 10/25/12 to taper back down to 200mg  total of topamax.   I would take 2x100mg  at night from now on.  She can take one of the 25mg  pills at night (to equal 225mg ) for four nights to taper back down to 200mg .   Thanks.

## 2013-03-24 NOTE — Telephone Encounter (Signed)
Spoke with pt and asked her if her dosage had ever been changed or if the medication had been stopped and she said no. She only has 4 pills left and after tonight it will be 2 left. Please advise.

## 2013-04-17 ENCOUNTER — Other Ambulatory Visit: Payer: Self-pay | Admitting: Family Medicine

## 2013-04-19 NOTE — Telephone Encounter (Signed)
Pt request status of refill on topiramate to [facility].Please advise.

## 2013-04-19 NOTE — Telephone Encounter (Signed)
Electronic refill request. Last filled:  60 tablets on  12 04/08/2012.  Please advise.

## 2013-04-19 NOTE — Telephone Encounter (Signed)
Sent!

## 2013-04-19 NOTE — Telephone Encounter (Signed)
Left detailed message on voicemail of cell phone.  N/A and no VM at home number.

## 2013-04-19 NOTE — Telephone Encounter (Signed)
Pt called back checking on status of refill for topiramate 100 mg; pt is out of med and request cb when sent to pharmacy. Pt has a headache due to pt not having topiramate for 24 hours.Please advise.

## 2013-04-20 ENCOUNTER — Encounter: Payer: Self-pay | Admitting: Neurology

## 2013-04-20 ENCOUNTER — Ambulatory Visit (INDEPENDENT_AMBULATORY_CARE_PROVIDER_SITE_OTHER): Payer: BC Managed Care – PPO | Admitting: Neurology

## 2013-04-20 VITALS — BP 124/74 | HR 78 | Temp 97.0°F | Resp 18 | Ht 64.0 in | Wt 208.6 lb

## 2013-04-20 DIAGNOSIS — R51 Headache: Secondary | ICD-10-CM

## 2013-04-20 DIAGNOSIS — G43009 Migraine without aura, not intractable, without status migrainosus: Secondary | ICD-10-CM | POA: Insufficient documentation

## 2013-04-20 LAB — COMPLETE METABOLIC PANEL WITH GFR
ALT: 61 U/L — AB (ref 0–35)
AST: 33 U/L (ref 0–37)
Albumin: 4.1 g/dL (ref 3.5–5.2)
Alkaline Phosphatase: 53 U/L (ref 39–117)
BILIRUBIN TOTAL: 0.4 mg/dL (ref 0.2–1.2)
BUN: 21 mg/dL (ref 6–23)
CO2: 23 mEq/L (ref 19–32)
CREATININE: 0.9 mg/dL (ref 0.50–1.10)
Calcium: 8.9 mg/dL (ref 8.4–10.5)
Chloride: 111 mEq/L (ref 96–112)
GFR, Est African American: >89 mL/min
GFR, Est Non African American: 79 mL/min
Glucose, Bld: 85 mg/dL (ref 70–99)
Potassium: 4.3 mEq/L (ref 3.5–5.3)
Sodium: 141 mEq/L (ref 135–145)
Total Protein: 6.8 g/dL (ref 6.0–8.3)

## 2013-04-20 MED ORDER — SUMATRIPTAN SUCCINATE REFILL 4 MG/0.5ML ~~LOC~~ SOCT: 1.0000 | SUBCUTANEOUS | Status: DC | PRN

## 2013-04-20 NOTE — Patient Instructions (Signed)
Continue topamax 100mg  twice daily For acute headache, take the imitrex injection only (don't take the pill) Check blood work Call for refills.  Follow up in 3 months.

## 2013-04-20 NOTE — Progress Notes (Signed)
NEUROLOGY CONSULTATION NOTE  Bridget Beck MRN: [id number] DOB: 1970-09-02  Referring provider: Dr. Damita Dunnings Primary care provider: Dr. Damita Dunnings  Reason for consult:  Migraines  HISTORY OF PRESENT ILLNESS: Bridget Beck is a 43 year old right-handed woman with history of lumbar surgery who presents for headache.  Records and images were personally reviewed where available.    Onset:  Since childhood. Location:  Usually left sided (rarely right-sided or holocephalic) Quality:  pounding Intensity:  10/10 Aura:  no Associated symptoms:  Nausea, photophobia, phonophobia, osmophobia, difficulty seeing (cannot elaborate), talks jibberish, sometimes vomiting Duration:  Several hours but dull residual headache for a day or two afterwards Frequency:  2-3 times per month (used to be most days of the month up until around a year ago). Triggers/exacerbating factors:  Peanuts, alcohol Relieving factors:  none Activity:  Needs to lay down in dark quite room  Past abortive therapy:  [name] Past preventative therapy:  Depakote (side effects), propranolol (unsure of dose.  ineffective), amitriptyline 25mg  (triggered headache), zonisamide 600mg  (stopped being effective), Botox (tried once but then insurance wouldn't cover it)  Current abortive therapy:  Imitrex shot (takes twice a month.  Effective), Imitrex 100mg  (takes 2-3 times a month.  Effective sometimes), ibuprofen (takes edge off), Promethazine 25mg  (helps) Current preventative therapy:  Topamax 125mg  BID  Alcohol:  no Smoking:  former Caffeine:  no Sleep hygiene:  okay Stress/depression:  Family stress (father is sick, cares for ailing grandmother daily who lives in [place]).  Mild depression due to family issues but not too bad. Family history:  Trigeminal neuralgia (mom)  10/21/11 MRI BRAIN W/WO CONTRAST & MRA HEAD (report only):  unremarkable.Marland Kitchen  PAST MEDICAL HISTORY: Past Medical History    Diagnosis Date  . Depression   . Migraines     with aura    PAST SURGICAL HISTORY: Past Surgical History  Procedure Laterality Date  . Cervical fusion      MEDICATIONS: Current Outpatient Prescriptions on File Prior to Visit  Medication Sig Dispense Refill  . citalopram (CELEXA) 20 MG tablet TAKE ONE TABLET BY MOUTH EVERY DAY  30 tablet  5  . Cranberry 125 MG TABS Take 1 tablet by mouth as needed.       . cyclobenzaprine (FLEXERIL) 10 MG tablet Take 1 tablet (10 mg total) by mouth 3 (three) times daily as needed (migraine- sedation caution).  30 tablet  2  . promethazine (PHENERGAN) 25 MG tablet Take 0.5-1 tablets (12.5-25 mg total) by mouth every 8 (eight) hours as needed for nausea.  30 tablet  2  . promethazine (PHENERGAN) 25 MG tablet Take 1 tablet (25 mg total) by mouth every 6 (six) hours as needed for nausea.  40 tablet  3  . topiramate (TOPAMAX) 100 MG tablet TAKE 1 TABLET BY MOUTH TWICE A DAY  60 tablet  1  . topiramate (TOPAMAX) 25 MG tablet Take 2 a day (along with 2x100mg  tabs) for 250mg  per day total.  60 tablet  5  . valACYclovir (VALTREX) 1000 MG tablet Take 2 tablets (2,000 mg total) by mouth 2 (two) times daily. For 1 day.  Repeat as needed with future episodes.  8 tablet  1   No current facility-administered medications on file prior to visit.    ALLERGIES: Allergies  Allergen Reactions  . Amitriptyline Other (See Comments)    Had severe migraine and pt could not see for 2 hours.  [name] [Pregabalin]  Migraine  . Prozac [Fluoxetine Hcl]     Fatigue   . Remeron [Mirtazapine]     "felt like a zombie"    FAMILY HISTORY: Family History  Problem Relation Age of Onset  . Peripheral vascular disease Father   . Stroke Sister     possible CVA    SOCIAL HISTORY: History   Social History  . Marital Status: Married    Spouse Name: N/A    Number of Children: 3  . Years of Education: N/A   Occupational History  . CMA    Social History Main  Topics  . Smoking status: Current Some Day Smoker -- 0.10 packs/day for 3 years    Types: Cigarettes  . Smokeless tobacco: Former Systems developer    Quit date: 11/10/2012  . Alcohol Use: No  . Drug Use: No  . Sexual Activity: Yes    Partners: Male   Other Topics Concern  . Not on file   Social History Narrative   2 years of college.   Former CMA at [employer]   1st husband died from [employer] in 2010/05/31   Remarried    3 kids    REVIEW OF SYSTEMS: Constitutional: No fevers, chills, or sweats, no generalized fatigue, change in appetite Eyes: No visual changes, double vision, eye pain Ear, nose and throat: No hearing loss, ear pain, nasal congestion, sore throat Cardiovascular: No chest pain, palpitations Respiratory:  No shortness of breath at rest or with exertion, wheezes GastrointestinaI: No nausea, vomiting, diarrhea, abdominal pain, fecal incontinence Genitourinary:  No dysuria, urinary retention or frequency Musculoskeletal:  Neck and back pain Integumentary: No rash, pruritus, skin lesions Neurological: as above Psychiatric: No depression, insomnia, anxiety Endocrine: No palpitations, fatigue, diaphoresis, mood swings, change in appetite, change in weight, increased thirst Hematologic/Lymphatic:  No anemia, purpura, petechiae. Allergic/Immunologic: no itchy/runny eyes, nasal congestion, recent allergic reactions, rashes  PHYSICAL EXAM: Filed Vitals:   04/20/13 0804  BP: 124/74  Pulse: 78  Temp: 97 F (36.1 C)  Resp: 18   General: No acute distress Head:  Normocephalic/atraumatic Neck: supple, no paraspinal tenderness, full range of motion Back: No paraspinal tenderness Heart: regular rate and rhythm Lungs: Clear to auscultation bilaterally. Vascular: No carotid bruits. Neurological Exam: Mental status: alert and oriented to person, place, and time, recent and remote memory intact, fund of knowledge intact, attention and concentration intact, speech fluent and not  dysarthric, language intact. Cranial nerves: CN I: not tested CN II: pupils equal, round and reactive to light, visual fields intact, fundi unremarkable, without vessel changes, exudates, hemorrhages or papilledema. CN III, IV, VI:  full range of motion, no nystagmus, no ptosis CN V: facial sensation intact CN VII: upper and lower face symmetric CN VIII: hearing intact CN IX, X: gag intact, uvula midline CN XI: sternocleidomastoid and trapezius muscles intact CN XII: tongue midline Bulk & Tone: normal, no fasciculations. Motor: 5/5 throughout Sensation: temperature and vibration intact. Deep Tendon Reflexes: 2+ throughout, toes down Finger to nose testing: no dysmetria Heel to shin: no dysmetria Gait: normal station and stride.  Able to turn, walk on toes, heels and in tandem. Romberg negative.  IMPRESSION: Episodic migraine without aura.  It sounds like it used to be chronic, which is not the case now, so the topiramate is effective.  This is a dramatic reduction in frequency and this may be the best we can get.  PLAN: 1.  Continue the topiramate but at 100mg  twice daily (no improvement when dose increased to  125mg  twice daily).  Instructed to stay hydrated due to risk of metabolic acidosis or 1% risk of kidneys stones.  Discussed small and rare risk for glaucoma so to get eyes checked if notices problems with vision. 2.  For abortive therapy, we will only have her take the Imitrex injection (start with 4mg ), since this typically always aborts it quickly (pill not always effective).  Will discontinue the 100mg  po. 3.  Check CMP. 4.  Follow up in 3 months.   Thank you for allowing me to take part in the care of this patient.  Metta Clines, DO  CC:  Elsie Stain, MD

## 2013-05-06 ENCOUNTER — Telehealth: Payer: Self-pay | Admitting: Family Medicine

## 2013-05-06 DIAGNOSIS — M2550 Pain in unspecified joint: Secondary | ICD-10-CM

## 2013-05-06 NOTE — Telephone Encounter (Signed)
Ordered. Thanks

## 2013-05-06 NOTE — Telephone Encounter (Signed)
Pt request ortho referral for left hip and left knee.

## 2013-06-16 ENCOUNTER — Other Ambulatory Visit: Payer: Self-pay | Admitting: Family Medicine

## 2013-06-16 NOTE — Telephone Encounter (Signed)
Received refill request electronically. Last refill 04/19/13 #60/1 refill. Last office visit 10/22/12. Is it okay to refill medication?

## 2013-06-16 NOTE — Telephone Encounter (Signed)
Sent!

## 2013-06-23 ENCOUNTER — Telehealth: Payer: Self-pay | Admitting: Neurology

## 2013-06-23 ENCOUNTER — Other Ambulatory Visit: Payer: Self-pay | Admitting: Family Medicine

## 2013-06-23 NOTE — Telephone Encounter (Signed)
Pt states that she is out of medication and the drug store faxed over the rx please  Patient phone number is [phone]

## 2013-06-23 NOTE — Telephone Encounter (Signed)
Received refill request electronically. Last office visit 10/22/12/acute. Patient sees Dr. Cyndi Bender. Is it okay to refill medication?

## 2013-06-23 NOTE — Telephone Encounter (Signed)
Prev sent.   

## 2013-07-17 ENCOUNTER — Other Ambulatory Visit: Payer: Self-pay | Admitting: Family Medicine

## 2013-07-19 NOTE — Telephone Encounter (Signed)
Received refill request electronically from pharmacy. Last office visit 06/05/12. See allergy/contraindication. Is it okay to refill medication?

## 2013-07-20 ENCOUNTER — Ambulatory Visit: Payer: BLUE CROSS/BLUE SHIELD | Admitting: Neurology

## 2013-07-20 NOTE — Telephone Encounter (Signed)
Okay to continue.  Sent.  Thanks. 

## 2013-07-27 ENCOUNTER — Encounter: Payer: Self-pay | Admitting: Neurology

## 2013-07-27 ENCOUNTER — Ambulatory Visit (INDEPENDENT_AMBULATORY_CARE_PROVIDER_SITE_OTHER): Payer: BC Managed Care – PPO | Admitting: Neurology

## 2013-07-27 VITALS — BP 100/68 | HR 70 | Temp 98.0°F | Resp 18 | Ht 64.0 in | Wt 214.7 lb

## 2013-07-27 DIAGNOSIS — G43709 Chronic migraine without aura, not intractable, without status migrainosus: Secondary | ICD-10-CM

## 2013-07-27 DIAGNOSIS — G2581 Restless legs syndrome: Secondary | ICD-10-CM

## 2013-07-27 DIAGNOSIS — G4440 Drug-induced headache, not elsewhere classified, not intractable: Secondary | ICD-10-CM

## 2013-07-27 DIAGNOSIS — G4701 Insomnia due to medical condition: Secondary | ICD-10-CM

## 2013-07-27 MED ORDER — TOPIRAMATE 100 MG PO TABS: 250.0000 mg | ORAL_TABLET | Freq: Every day | ORAL | Status: DC

## 2013-07-27 MED ORDER — PRAMIPEXOLE DIHYDROCHLORIDE 0.125 MG PO TABS: 0.1250 mg | ORAL_TABLET | Freq: Every day | ORAL | Status: DC

## 2013-07-27 MED ORDER — TOPIRAMATE 50 MG PO TABS: 50.0000 mg | ORAL_TABLET | Freq: Every day | ORAL | Status: DC

## 2013-07-27 NOTE — Progress Notes (Signed)
NEUROLOGY FOLLOW UP OFFICE NOTE  Bridget Beck [id number]  HISTORY OF PRESENT ILLNESS: Bridget Beck is a 43 year old right-handed woman with depression who follows up for migraine without aura and also restless leg.  UPDATE: Since reducing the Topamax to 100 mg twice daily, she has noted increase frequency of headaches. She associates some of this to change in weather as well. Intensity:  10 out of 10 Duration:  2-3 days Frequency:  6 times a month. (Total of 15 headache days per month, 12 days of migraine per month)  Current abortive therapy:  Imitrex 4mg  [place] (helps but the headaches linger. She doesn't repeat the dose), promethazine 25mg . She takes Imitrex approximately once or twice a week. She also takes Tylenol and/or ibuprofen 4 days per week for the nagging headaches. Current preventative therapy:  Topamax 100mg  twice daily  Caffeine:  decaffeinated Sleep hygiene:  Poor to 2 restless leg Stress/depression:  Under control. She recently discontinued Celexa do to the restless leg. Diet:  Okay Exercise:  Walks daily  She also reports history of restless leg syndrome going back 2 years. It appears to be worse recently. She has an uncomfortable sensation in her legs and hips that causes an urge to move her lower extremities. When she moves her legs, the discomfort resolves. When she stops moving, the discomfort returns. She does note numbness and tingling in the feet, as well as the hands, but this may be related to Topamax.  She recently discontinued Celexa due to the restless leg.  HISTORY: Onset:  Since childhood. Location:  Usually left sided (rarely right-sided or holocephalic) Quality:  pounding Initial intensity:  10/10 Aura:  no Associated symptoms:  Nausea, photophobia, phonophobia, osmophobia, difficulty seeing (cannot elaborate), talks jibberish, sometimes vomiting Initial duration:  Several hours but dull residual headache for a day or two afterwards Initial  frequency:  2-3 times per month (used to be most days of the month up until around a year ago). Triggers/exacerbating factors:  Peanuts, alcohol Relieving factors:  none Activity:  Needs to lay down in dark quite room  Past abortive therapy:  [name], sumatriptan 100mg  po, ibuprofen (takes edge off) Past preventative therapy:  Depakote (side effects), propranolol (unsure of dose.  ineffective), amitriptyline 25mg  (triggered headache), zonisamide 600mg  (stopped being effective), Botox (tried once but then insurance wouldn't cover it)  Family history:  Trigeminal neuralgia (mom)  10/21/11 MRI BRAIN W/WO CONTRAST & MRA HEAD (report only):  unremarkable.  PAST MEDICAL HISTORY: Past Medical History  Diagnosis Date  . Depression   . Migraines     with aura    MEDICATIONS: Current Outpatient Prescriptions on File Prior to Visit  Medication Sig Dispense Refill  . citalopram (CELEXA) 20 MG tablet TAKE 1 TABLET BY MOUTH EVERY DAY  30 tablet  12  . Cranberry 125 MG TABS Take 1 tablet by mouth as needed.       . cyclobenzaprine (FLEXERIL) 10 MG tablet Take 1 tablet (10 mg total) by mouth 3 (three) times daily as needed (migraine- sedation caution).  30 tablet  2  . promethazine (PHENERGAN) 25 MG tablet Take 0.5-1 tablets (12.5-25 mg total) by mouth every 8 (eight) hours as needed for nausea.  30 tablet  2  . promethazine (PHENERGAN) 25 MG tablet Take 1 tablet (25 mg total) by mouth every 6 (six) hours as needed for nausea.  40 tablet  3  . SUMAtriptan Succinate Refill (IMITREX STATDOSE REFILL) 4  MG/0.5ML SOCT Inject 1 Syringe into the skin as needed.  2 Syringe  3  . valACYclovir (VALTREX) 1000 MG tablet Take 2 tablets (2,000 mg total) by mouth 2 (two) times daily. For 1 day.  Repeat as needed with future episodes.  8 tablet  1   No current facility-administered medications on file prior to visit.    ALLERGIES: Allergies  Allergen Reactions  . Amitriptyline  Other (See Comments)    Had severe migraine and pt could not see for 2 hours.  [name] [Pregabalin]     Migraine  . Prozac [Fluoxetine Hcl]     Fatigue   . Remeron [Mirtazapine]     "felt like a zombie"    FAMILY HISTORY: Family History  Problem Relation Age of Onset  . Peripheral vascular disease Father   . Stroke Sister     possible CVA    SOCIAL HISTORY: History   Social History  . Marital Status: Married    Spouse Name: N/A    Number of Children: 3  . Years of Education: N/A   Occupational History  . CMA    Social History Main Topics  . Smoking status: Current Some Day Smoker -- 0.10 packs/day for 3 years    Types: Cigarettes  . Smokeless tobacco: Former Systems developer    Quit date: 11/10/2012  . Alcohol Use: No  . Drug Use: No  . Sexual Activity: Yes    Partners: Male   Other Topics Concern  . Not on file   Social History Narrative   2 years of college.   Former CMA at [employer]   1st husband died from [employer] in 05/20/2010   Remarried    3 kids    REVIEW OF SYSTEMS: Constitutional: No fevers, chills, or sweats, no generalized fatigue, change in appetite Eyes: No visual changes, double vision, eye pain Ear, nose and throat: No hearing loss, ear pain, nasal congestion, sore throat Cardiovascular: No chest pain, palpitations Respiratory:  No shortness of breath at rest or with exertion, wheezes GastrointestinaI: No nausea, vomiting, diarrhea, abdominal pain, fecal incontinence Genitourinary:  No dysuria, urinary retention or frequency Musculoskeletal:  No neck pain, back pain Integumentary: No rash, pruritus, skin lesions Neurological: as above Psychiatric: No depression, insomnia, anxiety Endocrine: No palpitations, fatigue, diaphoresis, mood swings, change in appetite, change in weight, increased thirst Hematologic/Lymphatic:  No anemia, purpura, petechiae. Allergic/Immunologic: no itchy/runny eyes, nasal congestion, recent allergic reactions,  rashes  PHYSICAL EXAM: Filed Vitals:   07/27/13 1442  BP: 100/68  Pulse: 70  Temp: 98 F (36.7 C)  Resp: 18   General: No acute distress Head:  Normocephalic/atraumatic Neck: supple, no paraspinal tenderness, full range of motion Heart:  Regular rate and rhythm Lungs:  Clear to auscultation bilaterally Back: No paraspinal tenderness Neurological Exam: alert and oriented to person, place, and time. Attention span and concentration intact, recent and remote memory intact, fund of knowledge intact.  Speech fluent and not dysarthric, language intact.  CN II-XII intact. Fundoscopic exam unremarkable without vessel changes, exudates, hemorrhages or papilledema.  Bulk and tone normal, muscle strength 5/5 throughout.  Sensation to light touch, temperature and vibration intact.  Deep tendon reflexes 2+ throughout, toes downgoing.  Finger to nose and heel to shin testing intact.  Gait normal, Romberg negative.  IMPRESSION: Chronic migraine without aura Medication-overuse headache Restless leg syndrome  PLAN: 1.  Change topamax to 250mg  at bedtime.  Since she is off Celexa, consider nortriptyline in the future. 2.  Take  Imitrex 4mg  [place] at earliest onset of migraine but may repeat once in one hour if needed. 3.  Stop Tylenol and ibuprofen (limit use of pain relievers to 1-2 days per week) 4.  Initiate Mirapex 0.125mg  two hours prior to bedtime.  Side effects such as hallucinations and compulsive behavior discussed. 5.  Check B12, TSH and ferritin levels (secondary causes of restless leg) 6.  Call in 4 weeks with update.  Follow up in 3 months.  Metta Clines, DO  CC:  Elsie Stain, MD

## 2013-07-27 NOTE — Patient Instructions (Addendum)
1.  We will increase topamax to 250mg  (2.5 tablets) at bedtime.   2.  At earliest onset of migraine, take Imitrex injection.  May repeat once in one hour if needed. 3.  For restless leg, we will start Mirapex 0.125mg  at bedtime.  Take 2 hours prior to going to bed. 4.  We will check B12, TSH, and ferritin level. Your provider has requested that you have labwork completed today. Please go to [facility] on the first floor of this building before leaving the office today. 5.  Call in 4 weeks with update.  Follow up in 3 months.

## 2013-07-28 ENCOUNTER — Telehealth: Payer: Self-pay | Admitting: *Deleted

## 2013-07-28 LAB — FERRITIN: Ferritin: 19 ng/mL (ref 10–291)

## 2013-07-28 LAB — TSH: TSH: 1.181 u[IU]/mL (ref 0.350–4.500)

## 2013-07-28 LAB — VITAMIN B12: VITAMIN B 12: 302 pg/mL (ref 211–911)

## 2013-07-28 NOTE — Telephone Encounter (Signed)
Patient is aware of labs  And advised to take  B12 1037mcg  . She ask if she could take the Topamax in am and not at night as she is not sleeping  ? Please advise

## 2013-07-28 NOTE — Telephone Encounter (Signed)
She can take the Topamax in the morning.  We usually prescribe it at night because it typically causes sleepiness.

## 2013-07-28 NOTE — Telephone Encounter (Signed)
Message copied by Claudie Revering on Wed Jul 28, 2013  8:40 AM ------      Message from: JAFFE, ADAM R      Created: Wed Jul 28, 2013  6:20 AM       Labs are normal.  Although the B12 level is normal, it is in the lower end of normal.  She may want to consider taking B12 supplement 1059mcg daily.  Maybe this may help with the leg discomfort.       ----- Message -----         From: [facility] Interface         Sent: 07/28/2013   1:50 AM           To: Dudley Major, DO                   ------

## 2013-07-28 NOTE — Telephone Encounter (Signed)
Patient is aware she can take medication in am

## 2013-08-04 ENCOUNTER — Telehealth: Payer: Self-pay | Admitting: Neurology

## 2013-08-04 NOTE — Telephone Encounter (Signed)
I suppose it is possible that it is the Mirapex, except she is on such a low dose.  Is it a different headache than her typical migraines?  If it is a different headache, I would stop the Mirapex and see how she does over the next few days.  If no improvement, then we would have to adjust headache medications.

## 2013-08-04 NOTE — Telephone Encounter (Signed)
Patient is waking up with intense headaches no visual issue she states . She is wondering if it is the Mirapex she has started taking ? Please advise

## 2013-08-04 NOTE — Telephone Encounter (Signed)
Pt is requesting to speak to a nurse. Please call her back at [phone]

## 2013-08-04 NOTE — Telephone Encounter (Signed)
Patient states the headache is different and the Mirapex was really helping . She will stop the Mirapex for a few days and call back to let us know it there is a difference

## 2013-08-06 NOTE — Telephone Encounter (Signed)
She is prescribed a total of 250mg  of topamax daily.  If she wishes to split it, then I would recommend 125mg  twice daily for now.  I don't want to increase the dose too fast.  We can increase the dose in 4 weeks if there is no improvement.

## 2013-08-06 NOTE — Telephone Encounter (Signed)
Pt has called this morning regarding her headaches and RLS. Please call [phone] / Sherri S.

## 2013-08-06 NOTE — Telephone Encounter (Signed)
Patient was advised to not increase  topamax to 250mg  bid Per Dr Tomi Likens he advised her to take 125mg  bid and if this does not work we can start to increase in 4 weeks

## 2013-08-06 NOTE — Telephone Encounter (Signed)
Patient wants to know if she can take 150 mg topamax  Am and 150 topamax in PM  That is what she did last night as well as the Mirapex and she did not have any problems .Please advise

## 2013-08-12 ENCOUNTER — Telehealth: Payer: Self-pay | Admitting: Neurology

## 2013-08-12 NOTE — Telephone Encounter (Signed)
Patient states she cannot take the Mirapex it is just not working please advise if there is something else she can take

## 2013-08-12 NOTE — Telephone Encounter (Signed)
There is room to go up on the Mirapex.  I would favor increasing it to 0.25mg .  She should call in a week with update and we can continue increasing dose from there as tolerated.

## 2013-08-12 NOTE — Telephone Encounter (Signed)
Patient does not want to increase she states this is causing her to have headaches.Please advise

## 2013-08-12 NOTE — Telephone Encounter (Signed)
Please give patient a call [phone]

## 2013-08-13 NOTE — Telephone Encounter (Signed)
She can try ropinirole 0.25mg  1-2 hours prior to bedtime.  She can call in 7 days with update.

## 2013-08-23 ENCOUNTER — Telehealth: Payer: Self-pay | Admitting: Neurology

## 2013-08-23 ENCOUNTER — Other Ambulatory Visit: Payer: Self-pay | Admitting: *Deleted

## 2013-08-23 NOTE — Telephone Encounter (Signed)
Pt called requesting to speak to a nurse regarding her restless leg medicine Rx her works great For her. She would like a refill on Topamax 50mg . [facility] [place] [facility] [place]  C/B [phone]

## 2013-08-23 NOTE — Telephone Encounter (Signed)
RX for topamax 50 mg  #30 [facility]  1 po qd with 3 refills

## 2013-08-28 LAB — HM MAMMOGRAPHY: HM MAMMO: NORMAL

## 2013-08-28 LAB — HM PAP SMEAR: HM Pap smear: NORMAL

## 2013-09-23 ENCOUNTER — Telehealth: Payer: Self-pay | Admitting: Neurology

## 2013-09-23 NOTE — Telephone Encounter (Signed)
Pt called requesting a refill for TOPAMAX 100MG  and her restless leg medicine (does not remember the name) PHARMACY: [facility] [place] C/B [phone]

## 2013-09-24 ENCOUNTER — Other Ambulatory Visit: Payer: Self-pay | Admitting: *Deleted

## 2013-09-24 MED ORDER — TOPIRAMATE 100 MG PO TABS: 250.0000 mg | ORAL_TABLET | Freq: Every day | ORAL | Status: DC

## 2013-09-27 ENCOUNTER — Other Ambulatory Visit: Payer: Self-pay | Admitting: *Deleted

## 2013-09-27 MED ORDER — ROPINIROLE HCL 0.25 MG PO TABS
0.2500 mg | ORAL_TABLET | Freq: Three times a day (TID) | ORAL | Status: DC
Start: 2013-09-27 — End: 2013-12-06

## 2013-10-25 ENCOUNTER — Other Ambulatory Visit: Payer: Self-pay | Admitting: *Deleted

## 2013-10-25 MED ORDER — TOPIRAMATE 100 MG PO TABS: 250.0000 mg | ORAL_TABLET | Freq: Every day | ORAL | Status: DC

## 2013-10-26 ENCOUNTER — Ambulatory Visit (INDEPENDENT_AMBULATORY_CARE_PROVIDER_SITE_OTHER): Payer: BC Managed Care – PPO | Admitting: Neurology

## 2013-10-26 ENCOUNTER — Encounter: Payer: Self-pay | Admitting: Neurology

## 2013-10-26 VITALS — BP 108/66 | HR 56 | Temp 98.0°F | Resp 16

## 2013-10-26 DIAGNOSIS — G479 Sleep disorder, unspecified: Secondary | ICD-10-CM

## 2013-10-26 DIAGNOSIS — R413 Other amnesia: Secondary | ICD-10-CM

## 2013-10-26 DIAGNOSIS — G2581 Restless legs syndrome: Secondary | ICD-10-CM

## 2013-10-26 DIAGNOSIS — G43839 Menstrual migraine, intractable, without status migrainosus: Secondary | ICD-10-CM

## 2013-10-26 NOTE — Progress Notes (Signed)
NEUROLOGY FOLLOW UP OFFICE NOTE  Ilina Xu Jackson [id number]  HISTORY OF PRESENT ILLNESS: Bridget Beck is a 43 year old right-handed woman with depression who follows up for migraine without aura and also restless leg.   UPDATE: Headaches have pretty much been relegated to menstrual migraines.  They start a week prior to menses and throughout her menses (approximately two weeks).  It starts as a dull nagging headache and gradually increases.  On day 3, she develops her full-blown migraine.  She will take Imitrex shot and it eases the intensity, but will gradually resolve over the next 1.5 weeks.  So, it is still about 14 days of headache with 1 day of migraine.  She also reports difficulty focusing, as well as problems with memory.  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  However, she is concerned that she would not be able to do the job.  Current abortive therapy:  Imitrex 4mg  [place] (helps but the headaches linger. She doesn't repeat the dose), promethazine 25mg . She takes Imitrex approximately once or twice a week. She also takes Tylenol and/or ibuprofen 4 days per week for the nagging headaches. Current preventative therapy:  Topamax 250mg   Caffeine:  decaffeinated Sleep hygiene:  Restless leg is well controlled.  She falls asleep quickly and typically stays asleep (although on further questioning, she does wake up during the night and has trouble falling back asleep).  She reports that she is fatigued in the morning. Stress/depression:  Under control. She recently discontinued Celexa do to the restless leg. Diet:  Okay Exercise:  Walks daily  Regarding restless leg:  Mirapex caused headaches.  She was started on ropinirole 0.25mg .  This has been effective.  Labs 07/27/13:  B12 302, TSH 1.181, ferritin 19  PAST MEDICAL HISTORY: Past Medical History  Diagnosis Date  . Depression   . Migraines     with aura    MEDICATIONS: Current  Outpatient Prescriptions on File Prior to Visit  Medication Sig Dispense Refill  . citalopram (CELEXA) 20 MG tablet TAKE 1 TABLET BY MOUTH EVERY DAY  30 tablet  12  . Cranberry 125 MG TABS Take 1 tablet by mouth as needed.       . cyclobenzaprine (FLEXERIL) 10 MG tablet Take 1 tablet (10 mg total) by mouth 3 (three) times daily as needed (migraine- sedation caution).  30 tablet  2  . pramipexole (MIRAPEX) 0.125 MG tablet Take 1 tablet (0.125 mg total) by mouth at bedtime. Take 2 hours prior to going to bed.  30 tablet  3  . promethazine (PHENERGAN) 25 MG tablet Take 0.5-1 tablets (12.5-25 mg total) by mouth every 8 (eight) hours as needed for nausea.  30 tablet  2  . promethazine (PHENERGAN) 25 MG tablet Take 1 tablet (25 mg total) by mouth every 6 (six) hours as needed for nausea.  40 tablet  3  . rOPINIRole (REQUIP) 0.25 MG tablet Take 1 tablet (0.25 mg total) by mouth 3 (three) times daily.  30 tablet  1  . SUMAtriptan Succinate Refill (IMITREX STATDOSE REFILL) 4 MG/0.5ML SOCT Inject 1 Syringe into the skin as needed.  2 Syringe  3  . topiramate (TOPAMAX) 100 MG tablet Take 2.5 tablets (250 mg total) by mouth at bedtime.  75 tablet  2  . valACYclovir (VALTREX) 1000 MG tablet Take 2 tablets (2,000 mg total) by mouth 2 (two) times daily. For 1 day.  Repeat as needed with future episodes.  8 tablet  1   No current facility-administered medications on file prior to visit.    ALLERGIES: Allergies  Allergen Reactions  . Amitriptyline Other (See Comments)    Had severe migraine and pt could not see for 2 hours.  [name] [Pregabalin]     Migraine  . Prozac [Fluoxetine Hcl]     Fatigue   . Remeron [Mirtazapine]     "felt like a zombie"    FAMILY HISTORY: Family History  Problem Relation Age of Onset  . Peripheral vascular disease Father   . Stroke Sister     possible CVA    SOCIAL HISTORY: History   Social History  . Marital Status: Married    Spouse Name: N/A    Number of  Children: 3  . Years of Education: N/A   Occupational History  . CMA    Social History Main Topics  . Smoking status: Current Some Day Smoker -- 0.10 packs/day for 3 years    Types: Cigarettes  . Smokeless tobacco: Former Systems developer    Quit date: 11/10/2012  . Alcohol Use: No  . Drug Use: No  . Sexual Activity: Yes    Partners: Male   Other Topics Concern  . Not on file   Social History Narrative   2 years of college.   Former CMA at [employer]   1st husband died from [employer] in May 20, 2010   Remarried    3 kids    REVIEW OF SYSTEMS: Constitutional: No fevers, chills, or sweats, no generalized fatigue, change in appetite Eyes: No visual changes, double vision, eye pain Ear, nose and throat: No hearing loss, ear pain, nasal congestion, sore throat Cardiovascular: No chest pain, palpitations Respiratory:  No shortness of breath at rest or with exertion, wheezes GastrointestinaI: No nausea, vomiting, diarrhea, abdominal pain, fecal incontinence Genitourinary:  No dysuria, urinary retention or frequency Musculoskeletal:  No neck pain, back pain Integumentary: No rash, pruritus, skin lesions Neurological: as above Psychiatric: Fatigue Endocrine: No palpitations, fatigue, diaphoresis, mood swings, change in appetite, change in weight, increased thirst Hematologic/Lymphatic:  No anemia, purpura, petechiae. Allergic/Immunologic: no itchy/runny eyes, nasal congestion, recent allergic reactions, rashes  PHYSICAL EXAM: Filed Vitals:   10/26/13 1049  BP: 108/66  Pulse: 56  Temp: 98 F (36.7 C)  Resp: 16   General: No acute distress Head:  Normocephalic/atraumatic  IMPRESSION: Chronic migraine without aura (now it appears to be completely menstrual migraine) Restless leg syndrome, controlled. Sleep disorder Memory problems, likely related to Topamax.  PLAN: 1.  Will initiate perimenstrual prophylaxis:  frovatriptan 2.5mg  twice daily beginning 2 days prior to onset of headache and  daily for 6 days. 2.  Stop Imitrex. 3.  Continue Topamax 250mg  for now.  Suggested that we should taper off and switch to another medication (or at least reduce the dose while titrating another medication), however she is hesitant to do this because she has had improvement in headaches since starting the Topamax.  We will readdress this later.  Alternatives would be Zoloft (although it may affect RLS) or atenolol. 4.  Must improve sleep hygiene, as it is likely the primary contributor to her migraines.  Discussed some techniques and will provide a paper with techniques when available. 6.  Follow up in 6 weeks.  30 minutes spent with patient, 100% spent counseling and coordinating plan  Metta Clines, DO  CC:  Elsie Stain, MD

## 2013-10-28 ENCOUNTER — Other Ambulatory Visit: Payer: Self-pay | Admitting: *Deleted

## 2013-10-28 MED ORDER — TOPIRAMATE 100 MG PO TABS: 250.0000 mg | ORAL_TABLET | Freq: Every day | ORAL | Status: DC

## 2013-12-01 ENCOUNTER — Ambulatory Visit (HOSPITAL_BASED_OUTPATIENT_CLINIC_OR_DEPARTMENT_OTHER)
Admission: RE | Admit: 2013-12-01 | Discharge: 2013-12-01 | Disposition: A | Discharge status: Home / Self Care | Payer: BC Managed Care – PPO | Source: Ambulatory Visit | Attending: Family | Admitting: Family

## 2013-12-01 ENCOUNTER — Encounter: Payer: Self-pay | Admitting: Family

## 2013-12-01 ENCOUNTER — Ambulatory Visit (INDEPENDENT_AMBULATORY_CARE_PROVIDER_SITE_OTHER): Payer: BC Managed Care – PPO | Admitting: Family

## 2013-12-01 ENCOUNTER — Ambulatory Visit: Payer: BC Managed Care – PPO | Admitting: Family

## 2013-12-01 VITALS — BP 110/62 | HR 74 | Temp 98.4°F | Resp 16 | Ht 65.0 in | Wt 223.2 lb

## 2013-12-01 DIAGNOSIS — Z23 Encounter for immunization: Secondary | ICD-10-CM

## 2013-12-01 DIAGNOSIS — R635 Abnormal weight gain: Secondary | ICD-10-CM | POA: Insufficient documentation

## 2013-12-01 DIAGNOSIS — M545 Low back pain: Secondary | ICD-10-CM

## 2013-12-01 DIAGNOSIS — M25552 Pain in left hip: Secondary | ICD-10-CM | POA: Insufficient documentation

## 2013-12-01 DIAGNOSIS — Z0289 Encounter for other administrative examinations: Secondary | ICD-10-CM

## 2013-12-01 NOTE — Patient Instructions (Addendum)
You will be contacted about completing your MRI and hip x ray. Try setting up an account using the free my fitness pal app with goal weight loss 1-2 pounds/day. Please set up a complete physical at your convenience in the next 3 months.

## 2013-12-01 NOTE — Progress Notes (Signed)
Subjective:    Patient ID: Bridget Beck, female    DOB: Feb 08, 1970, 43 y.o.   MRN: [id number]  HPI  Bridget Beck is a 43 yr old female who presents today in transfer from our [place] office where she was followed by Dr. Damita Dunnings. She presents with two main concerns:  1) Back Pain- has hx of herniated disc.  Report left hip pain and left leg numbness x 6 mos.  Previous hx of cervical disc fusion.  Reports hx of herniated disc per MRI at [facility]. Pain is in the lower back, intermittent. Pain is moderate.   2) Morbid Obesity- reports that she struggles with weight. Reports healthy diet, regular exercise. Despite these measures her weight continues to fluctuate. Reports that she was 115 in 2005-05-04.  Reports that she eats well- grilled, baked, veggies, salad. Drinks coffee with a little bit of sugar. Drinks a lot of water.  She is active throughout the day. She walks 3 times a week for sometimes up to an hour.   Wt Readings from Last 3 Encounters:  12/01/13 223 lb 3.2 oz (101.243 kg)  07/27/13 214 lb 11.2 oz (97.387 kg)  04/20/13 208 lb 9.6 oz (94.62 kg)   3) hip pain- Reports hx of bursitis in the left hip but has never had any imaging of her left hip. Reports sharp pain down the leg.  She would like referral to Dr. Emmaline Kluver med.  She is unable to take ibuprofen due to rebound headaches.    Review of Systems See HPI  Past Medical History  Diagnosis Date  . Depression   . Migraines     with aura    History   Social History  . Marital Status: Married    Spouse Name: N/A    Number of Children: 3  . Years of Education: N/A   Occupational History  . CMA    Social History Main Topics  . Smoking status: Former Smoker -- 0.10 packs/day for 3 years    Types: Cigarettes    Quit date: 10/28/2012  . Smokeless tobacco: Former Systems developer    Quit date: 11/10/2012  . Alcohol Use: 0.0 oz/week    0 Not specified per week     Comment: 1 glass of wine every 2-3 months.  . Drug Use: No  .  Sexual Activity:    Partners: Male   Other Topics Concern  . Not on file   Social History Narrative   2 years of college.   Former CMA at [employer]   1st husband died from [employer] in 2010-05-05   Remarried    3 kids    Past Surgical History  Procedure Laterality Date  . Cervical fusion      x 2    Family History  Problem Relation Age of Onset  . Peripheral vascular disease Father   . Stroke Sister     possible CVA  . Diabetes Mother     type II, diet controlled  . Cancer Maternal Aunt     non hodgkins lymphoma  . Heart disease Maternal Grandmother     Allergies  Allergen Reactions  . Amitriptyline Other (See Comments)    Had severe migraine and pt could not see for 2 hours.  [name] [Pregabalin]     Migraine  . Prozac [Fluoxetine Hcl]     Fatigue   . Remeron [Mirtazapine]     "felt like a zombie"    Current Outpatient Prescriptions on File Prior  to Visit  Medication Sig Dispense Refill  . Cranberry 125 MG TABS Take 1 tablet by mouth as needed.     . frovatriptan (FROVA) 2.5 MG tablet Take 2.5 mg by mouth as needed for migraine (2 day before onset of headache  and for 6 days total  then stop   # 12). If recurs, may repeat after 2 hours. Max of 3 tabs in 24 hours.    . promethazine (PHENERGAN) 25 MG tablet Take 0.5-1 tablets (12.5-25 mg total) by mouth every 8 (eight) hours as needed for nausea. 30 tablet 2  . promethazine (PHENERGAN) 25 MG tablet Take 1 tablet (25 mg total) by mouth every 6 (six) hours as needed for nausea. 40 tablet 3  . rOPINIRole (REQUIP) 0.25 MG tablet Take 1 tablet (0.25 mg total) by mouth 3 (three) times daily. 30 tablet 1  . SUMAtriptan Succinate Refill (IMITREX STATDOSE REFILL) 4 MG/0.5ML SOCT Inject 1 Syringe into the skin as needed. 2 Syringe 3  . topiramate (TOPAMAX) 100 MG tablet Take 2.5 tablets (250 mg total) by mouth at bedtime. 75 tablet 2  . valACYclovir (VALTREX) 1000 MG tablet Take 2 tablets (2,000 mg total) by mouth 2 (two) times  daily. For 1 day.  Repeat as needed with future episodes. 8 tablet 1   No current facility-administered medications on file prior to visit.    BP 110/62 mmHg  Pulse 74  Temp(Src) 98.4 F (36.9 C) (Oral)  Resp 16  Ht 5\' 5"  (1.651 m)  Wt 223 lb 3.2 oz (101.243 kg)  BMI 37.14 kg/m2  SpO2 100%  LMP 10/28/2013        Objective:   Physical Exam  Constitutional: She is oriented to person, place, and time. She appears well-developed and well-nourished. No distress.  HENT:  Head: Normocephalic and atraumatic.  Mouth/Throat: No oropharyngeal exudate.  Eyes: Pupils are equal, round, and reactive to light. No scleral icterus.  Cardiovascular: Normal rate and regular rhythm.   No murmur heard. Pulmonary/Chest: Effort normal and breath sounds normal. No respiratory distress. She has no wheezes. She has no rales. She exhibits no tenderness.  Musculoskeletal: She exhibits no edema.       Thoracic back: She exhibits no tenderness.       Lumbar back: She exhibits no bony tenderness.  + tenderness left lateral hip. Full ROM L hip without pain  Lymphadenopathy:    She has no cervical adenopathy.  Neurological: She is alert and oriented to person, place, and time. She exhibits normal muscle tone.  Skin: Skin is warm and dry.  Psychiatric: She has a normal mood and affect. Her behavior is normal. Judgment and thought content normal.          Assessment & Plan:

## 2013-12-01 NOTE — Assessment & Plan Note (Signed)
Obtain x ray of left hip.  Refer to Dr. Barbaraann Barthel for further evaluation.

## 2013-12-01 NOTE — Assessment & Plan Note (Signed)
We discussed monitoring diet and exercise using my fitness pal with goal weight loss of 1-2 pounds/week.

## 2013-12-01 NOTE — Assessment & Plan Note (Signed)
Will obtain MRI of the lumbar spine to further evaluate.

## 2013-12-02 ENCOUNTER — Telehealth: Payer: Self-pay | Admitting: Family

## 2013-12-02 MED ORDER — METHYLPREDNISOLONE 4 MG PO KIT: PACK | ORAL | Status: DC

## 2013-12-02 NOTE — Telephone Encounter (Signed)
Caller name: Samie Relation to pt: self Call back number: [phone] Pharmacy:   Reason for call:   Patient is requesting xray results from yesterday.

## 2013-12-02 NOTE — Telephone Encounter (Signed)
Spoke with [facility] MD.  They denied MRI because she has not had 4 weeks of supervised conservative treatment.  Will rx with medrol dose pak to see if this helps. Advise pt to follow up in 1 month so we can re-evaluate her symptoms, sooner if symptoms worsen.

## 2013-12-03 NOTE — Telephone Encounter (Signed)
Notified pt. She states she has taken Prednisone in the past and last treatment caused her joints to ache. Was not able to complete prescription. Notes aching stopped after stopping medication. Per verbal from Provider, ok for pt to try medrol dosepak and if joint stiffness and pain returns to stop medication and let us know. Scheduled f/u for 01/05/14 at 9:45am.

## 2013-12-03 NOTE — Telephone Encounter (Signed)
See previous phone note of 12/02/13.

## 2013-12-03 NOTE — Telephone Encounter (Signed)
Patient calling again regarding this. Best # [phone]

## 2013-12-06 ENCOUNTER — Ambulatory Visit: Payer: BC Managed Care – PPO | Admitting: Family Medicine

## 2013-12-06 ENCOUNTER — Other Ambulatory Visit: Payer: Self-pay | Admitting: Neurology

## 2013-12-07 ENCOUNTER — Encounter: Payer: Self-pay | Admitting: Family Medicine

## 2013-12-07 ENCOUNTER — Ambulatory Visit (INDEPENDENT_AMBULATORY_CARE_PROVIDER_SITE_OTHER): Payer: BC Managed Care – PPO | Admitting: Family Medicine

## 2013-12-07 ENCOUNTER — Ambulatory Visit (HOSPITAL_BASED_OUTPATIENT_CLINIC_OR_DEPARTMENT_OTHER)
Admission: RE | Admit: 2013-12-07 | Discharge: 2013-12-07 | Disposition: A | Discharge status: Home / Self Care | Payer: BC Managed Care – PPO | Source: Ambulatory Visit | Attending: Family Medicine | Admitting: Family Medicine

## 2013-12-07 VITALS — BP 103/68 | HR 79 | Ht 64.0 in | Wt 220.0 lb

## 2013-12-07 DIAGNOSIS — M4604 Spinal enthesopathy, thoracic region: Secondary | ICD-10-CM | POA: Insufficient documentation

## 2013-12-07 DIAGNOSIS — M545 Low back pain, unspecified: Secondary | ICD-10-CM

## 2013-12-07 DIAGNOSIS — M47899 Other spondylosis, site unspecified: Secondary | ICD-10-CM | POA: Diagnosis not present

## 2013-12-07 DIAGNOSIS — M5442 Lumbago with sciatica, left side: Secondary | ICD-10-CM

## 2013-12-07 DIAGNOSIS — M79605 Pain in left leg: Secondary | ICD-10-CM

## 2013-12-07 DIAGNOSIS — M5410 Radiculopathy, site unspecified: Secondary | ICD-10-CM | POA: Diagnosis present

## 2013-12-07 MED ORDER — METHOCARBAMOL 500 MG PO TABS: 500.0000 mg | ORAL_TABLET | Freq: Three times a day (TID) | ORAL | Status: DC | PRN

## 2013-12-07 NOTE — Patient Instructions (Signed)
Get x-rays as you leave today and we will try to get the MRI approved. You have lumbar radiculopathy (a pinched nerve in your low back). A prednisone dose pack is the best option for immediate relief and may be prescribed. Consider Flexeril as needed for muscle spasms (no driving on this medicine if it makes you sleepy). Stay as active as possible. Physical therapy has been shown to be helpful as well. Strengthening of low back muscles, abdominal musculature are key for long term pain relief. If not improving, will consider further imaging (MRI).

## 2013-12-08 ENCOUNTER — Encounter: Payer: Self-pay | Admitting: Family Medicine

## 2013-12-08 NOTE — Progress Notes (Signed)
PCP: Nance Pear., NP  Subjective:   HPI: Patient is a 43 y.o. female here for left hip/back pain.  Patient reports she's had ? Back or left hip pain for about a year. Feels pain in low back radiating down left leg to foot. She was provided cortisone injection for trochanteric bursitis but this only helped one day. No numbness or tingling. Recalls having a small disc herniation in 2011. Unable to sleep on left side. Prednisone dose pack from PCP has helped some. Pain currently 3/10 level. No bowel/bladder dysfunction.  Past Medical History  Diagnosis Date  . Depression   . Migraines     with aura    Current Outpatient Prescriptions on File Prior to Visit  Medication Sig Dispense Refill  . Cranberry 125 MG TABS Take 1 tablet by mouth as needed.     . frovatriptan (FROVA) 2.5 MG tablet Take 2.5 mg by mouth as needed for migraine (2 day before onset of headache  and for 6 days total  then stop   # 12). If recurs, may repeat after 2 hours. Max of 3 tabs in 24 hours.    . methylPREDNISolone (MEDROL DOSEPAK) 4 MG tablet follow package directions 21 tablet 0  . promethazine (PHENERGAN) 25 MG tablet Take 0.5-1 tablets (12.5-25 mg total) by mouth every 8 (eight) hours as needed for nausea. 30 tablet 2  . promethazine (PHENERGAN) 25 MG tablet Take 1 tablet (25 mg total) by mouth every 6 (six) hours as needed for nausea. 40 tablet 3  . rOPINIRole (REQUIP) 0.25 MG tablet TAKE 1 TABLET BY MOUTH 1-2 HOURS PRIOR TO BEDTIME 30 tablet 1  . SUMAtriptan Succinate Refill (IMITREX STATDOSE REFILL) 4 MG/0.5ML SOCT Inject 1 Syringe into the skin as needed. 2 Syringe 3  . topiramate (TOPAMAX) 100 MG tablet Take 2.5 tablets (250 mg total) by mouth at bedtime. 75 tablet 2  . valACYclovir (VALTREX) 1000 MG tablet Take 2 tablets (2,000 mg total) by mouth 2 (two) times daily. For 1 day.  Repeat as needed with future episodes. 8 tablet 1   No current facility-administered medications on file prior to  visit.    Past Surgical History  Procedure Laterality Date  . Cervical fusion      x 2    Allergies  Allergen Reactions  . Amitriptyline Other (See Comments)    Had severe migraine and pt could not see for 2 hours.  [name] [Pregabalin]     Migraine  . Prozac [Fluoxetine Hcl]     Fatigue   . Remeron [Mirtazapine]     "felt like a zombie"    History   Social History  . Marital Status: Married    Spouse Name: N/A    Number of Children: 3  . Years of Education: N/A   Occupational History  . CMA    Social History Main Topics  . Smoking status: Former Smoker -- 0.10 packs/day for 3 years    Types: Cigarettes    Quit date: 10/28/2012  . Smokeless tobacco: Former Systems developer    Quit date: 11/10/2012  . Alcohol Use: 0.0 oz/week    0 Not specified per week     Comment: 1 glass of wine every 2-3 months.  . Drug Use: No  . Sexual Activity:    Partners: Male   Other Topics Concern  . Not on file   Social History Narrative   2 years of college.   Former CMA at [employer] husband  died from [employer]'s/ESRD in 2012   Remarried    3 kids    Family History  Problem Relation Age of Onset  . Peripheral vascular disease Father   . Stroke Sister     possible CVA  . Diabetes Mother     type II, diet controlled  . Cancer Maternal Aunt     non hodgkins lymphoma  . Heart disease Maternal Grandmother     BP 103/68 mmHg  Pulse 79  Ht 5\' 4"  (1.626 m)  Wt 220 lb (99.791 kg)  BMI 37.74 kg/m2  LMP 12/01/2013  Review of Systems: See HPI above.    Objective:  Physical Exam:  Gen: NAD  Back/L hip: No gross deformity, scoliosis. TTP mildly left paraspinal lumbar region.  No midline or bony TTP. FROM. Strength LEs 5/5 all muscle groups.   2+ MSRs in patellar and achilles tendons, equal bilaterally. Negative SLRs. Sensation intact to light touch bilaterally. Negative logroll bilateral hips Negative fabers and piriformis stretches.    Assessment & Plan:  1. Lumbar  radiculopathy - description of pain consistent with left lumbar radiculopathy likely from disc bulge.  Radiographs reassuring.  Continue with prednisone dose pack until complete.  Flexeril as needed for spasms.  Shown home exercises to do daily.  PCP initially ordered MRI but she has to complete 4-6 weeks of conservative treatment before this would be approved - she will follow-up with her when this is the case and if not improved order MRI.  Consider physical therapy, ESIs in future.

## 2013-12-08 NOTE — Assessment & Plan Note (Signed)
description of pain consistent with left lumbar radiculopathy likely from disc bulge.  Radiographs reassuring.  Continue with prednisone dose pack until complete.  Flexeril as needed for spasms.  Shown home exercises to do daily.  PCP initially ordered MRI but she has to complete 4-6 weeks of conservative treatment before this would be approved - she will follow-up with her when this is the case and if not improved order MRI.  Consider physical therapy, ESIs in future.

## 2013-12-29 ENCOUNTER — Encounter: Payer: Self-pay | Admitting: Family

## 2013-12-29 ENCOUNTER — Ambulatory Visit (INDEPENDENT_AMBULATORY_CARE_PROVIDER_SITE_OTHER): Payer: BC Managed Care – PPO | Admitting: Family

## 2013-12-29 VITALS — BP 100/70 | HR 72 | Temp 97.9°F | Resp 16 | Ht 65.0 in | Wt 222.2 lb

## 2013-12-29 DIAGNOSIS — M5442 Lumbago with sciatica, left side: Secondary | ICD-10-CM

## 2013-12-29 DIAGNOSIS — M5417 Radiculopathy, lumbosacral region: Secondary | ICD-10-CM

## 2013-12-29 DIAGNOSIS — M5416 Radiculopathy, lumbar region: Secondary | ICD-10-CM

## 2013-12-29 MED ORDER — MELOXICAM 7.5 MG PO TABS: 7.5000 mg | ORAL_TABLET | Freq: Every day | ORAL | Status: DC

## 2013-12-29 NOTE — Patient Instructions (Signed)
Start meloxicam. We will contact you re: MRI once we have it scheduled.

## 2013-12-29 NOTE — Progress Notes (Signed)
Subjective:    Patient ID: Bridget Beck, female    DOB: 10/15/1970, 43 y.o.   MRN: [id number]  HPI  [name] is a 43 yr old female who presents today for follow up of back pain.  She was seen 1 month ago and given medrol dosepak at that time.  She noted slight improvement in her hip pain after medrol but the back pain continues.  Reports that both legs "go to sleep" when she is sitting on toilet. Notes that she has to sit with her legs reclined.  Pain radiates down the left leg and is severe (like a shock) worse with. Unable to carry groceries in from the car.  Has been doing the back exercises, causes more pain- not helping. Denies bowel or bladder issues other than some mild constipation. Feels like the left leg is weaker than the right.     Review of Systems    see HPI  Past Medical History  Diagnosis Date  . Depression   . Migraines     with aura    History   Social History  . Marital Status: Married    Spouse Name: N/A    Number of Children: 3  . Years of Education: N/A   Occupational History  . CMA    Social History Main Topics  . Smoking status: Former Smoker -- 0.10 packs/day for 3 years    Types: Cigarettes    Quit date: 10/28/2012  . Smokeless tobacco: Former Systems developer    Quit date: 11/10/2012  . Alcohol Use: 0.0 oz/week    0 Not specified per week     Comment: 1 glass of wine every 2-3 months.  . Drug Use: No  . Sexual Activity:    Partners: Male   Other Topics Concern  . Not on file   Social History Narrative   2 years of college.   Former CMA at [employer]   1st husband died from [employer] in 05-20-2010   Remarried    3 kids    Past Surgical History  Procedure Laterality Date  . Cervical fusion      x 2    Family History  Problem Relation Age of Onset  . Peripheral vascular disease Father   . Stroke Sister     possible CVA  . Diabetes Mother     type II, diet controlled  . Cancer Maternal Aunt     non hodgkins lymphoma  . Heart disease Maternal  Grandmother     Allergies  Allergen Reactions  . Amitriptyline Other (See Comments)    Had severe migraine and pt could not see for 2 hours.  [name] [Pregabalin]     Migraine  . Methocarbamol Other (See Comments)    Stomach upset  . Prozac [Fluoxetine Hcl]     Fatigue   . Remeron [Mirtazapine]     "felt like a zombie"    Current Outpatient Prescriptions on File Prior to Visit  Medication Sig Dispense Refill  . Cranberry 125 MG TABS Take 1 tablet by mouth as needed.     . frovatriptan (FROVA) 2.5 MG tablet Take 2.5 mg by mouth as needed for migraine (2 day before onset of headache  and for 6 days total  then stop   # 12). If recurs, may repeat after 2 hours. Max of 3 tabs in 24 hours.    . promethazine (PHENERGAN) 25 MG tablet Take 0.5-1 tablets (12.5-25 mg total) by mouth every 8 (eight)  hours as needed for nausea. 30 tablet 2  . promethazine (PHENERGAN) 25 MG tablet Take 1 tablet (25 mg total) by mouth every 6 (six) hours as needed for nausea. 40 tablet 3  . rOPINIRole (REQUIP) 0.25 MG tablet TAKE 1 TABLET BY MOUTH 1-2 HOURS PRIOR TO BEDTIME 30 tablet 1  . SUMAtriptan Succinate Refill (IMITREX STATDOSE REFILL) 4 MG/0.5ML SOCT Inject 1 Syringe into the skin as needed. 2 Syringe 3  . topiramate (TOPAMAX) 100 MG tablet Take 2.5 tablets (250 mg total) by mouth at bedtime. 75 tablet 2  . valACYclovir (VALTREX) 1000 MG tablet Take 2 tablets (2,000 mg total) by mouth 2 (two) times daily. For 1 day.  Repeat as needed with future episodes. 8 tablet 1   No current facility-administered medications on file prior to visit.    BP 100/70 mmHg  Pulse 72  Temp(Src) 97.9 F (36.6 C) (Oral)  Resp 16  Ht 5\' 5"  (1.651 m)  Wt 222 lb 3.2 oz (100.789 kg)  BMI 36.98 kg/m2  SpO2 99%  LMP 12/01/2013    Objective:   Physical Exam  Constitutional: She is oriented to person, place, and time. She appears well-developed and well-nourished. No distress.  HENT:  Head: Normocephalic and atraumatic.   Cardiovascular: Normal rate and regular rhythm.   No murmur heard. Pulmonary/Chest: Effort normal and breath sounds normal. No respiratory distress. She has no wheezes. She has no rales. She exhibits no tenderness.  Musculoskeletal: She exhibits no edema.       Thoracic back: She exhibits no tenderness.       Lumbar back: She exhibits no tenderness.  Neurological: She is alert and oriented to person, place, and time.  Reflex Scores:      Patellar reflexes are 2+ on the right side and 3+ on the left side. Bilateral LE strength is 5/5  Psychiatric: She has a normal mood and affect. Her behavior is normal. Judgment and thought content normal.          Assessment & Plan:

## 2013-12-29 NOTE — Assessment & Plan Note (Signed)
Rx with meloxicam, needs MRI.  Has failed 4 weeks of conservative therapy.

## 2013-12-29 NOTE — Progress Notes (Signed)
Pre visit review using our clinic review tool, if applicable. No additional management support is needed unless otherwise documented below in the visit note. 

## 2014-01-03 ENCOUNTER — Ambulatory Visit: Payer: BC Managed Care – PPO | Admitting: Family

## 2014-01-05 ENCOUNTER — Telehealth: Payer: Self-pay | Admitting: Family

## 2014-01-05 ENCOUNTER — Ambulatory Visit (HOSPITAL_BASED_OUTPATIENT_CLINIC_OR_DEPARTMENT_OTHER)
Admission: RE | Admit: 2014-01-05 | Discharge: 2014-01-05 | Disposition: A | Discharge status: Home / Self Care | Payer: BC Managed Care – PPO | Source: Ambulatory Visit | Attending: Family | Admitting: Family

## 2014-01-05 DIAGNOSIS — M545 Low back pain: Secondary | ICD-10-CM | POA: Insufficient documentation

## 2014-01-05 DIAGNOSIS — M5442 Lumbago with sciatica, left side: Secondary | ICD-10-CM

## 2014-01-05 NOTE — Telephone Encounter (Signed)
See mychart  Message.

## 2014-01-06 ENCOUNTER — Telehealth: Payer: Self-pay | Admitting: Family

## 2014-01-06 NOTE — Telephone Encounter (Signed)
Please advise 

## 2014-01-06 NOTE — Telephone Encounter (Signed)
Pt inquiring about x-ray results.  

## 2014-01-06 NOTE — Telephone Encounter (Signed)
See my chart message

## 2014-01-07 ENCOUNTER — Telehealth: Payer: Self-pay

## 2014-01-07 NOTE — Telephone Encounter (Signed)
Patient called upset referencing the Neuro referral. States that no one had let her know what the results were and she was getting referred. After investigation saw that she had been sent a detailed MyChart message by PCP. Read information to her. Verbalizes understanding.

## 2014-01-14 ENCOUNTER — Other Ambulatory Visit: Payer: Self-pay | Admitting: Family Medicine

## 2014-01-17 ENCOUNTER — Telehealth: Payer: Self-pay | Admitting: *Deleted

## 2014-01-17 ENCOUNTER — Telehealth: Payer: Self-pay | Admitting: Neurology

## 2014-01-17 NOTE — Telephone Encounter (Signed)
Pt states that she has had a migraine for 4 days and wants to ask some questions about the medication or injection please call [phone]

## 2014-01-17 NOTE — Telephone Encounter (Signed)
Last time, it appeared that her migraines always started a week before her period.  I prescribed her Frova to take daily for 6 days starting 2 days prior to the onset of headache.  Is that the case with this?  We had discontinued the Imitrex last visit.  I wouldn't increase any medication if this is an isolated incident, meaning that her headaches have been well-controlled before this week.

## 2014-01-17 NOTE — Telephone Encounter (Signed)
Patient says she only took Frova 1 time it did not help and she could not afford it . She is going to wait for a few more days and see it this is due to her cycle or the weather then she will call back and let the office know . She did not stop the Imitex .

## 2014-01-17 NOTE — Telephone Encounter (Signed)
Patient called stating she has had headache x 4 days is feeling some better today she thinks it might be  from the weather or allergies but is asking should her Imitrex or Topamax be increased ? Please advise

## 2014-01-24 ENCOUNTER — Other Ambulatory Visit: Payer: Self-pay | Admitting: Neurology

## 2014-02-14 ENCOUNTER — Telehealth: Payer: Self-pay | Admitting: Neurology

## 2014-02-14 NOTE — Telephone Encounter (Signed)
Patient called stating she would like to have a refill on her  imitrex injections I advised her to call pharmacy and have them send prior auth form and I would move forward with getting that for her she ask for pills and injection both since she will be without  Insurance until March I do not think that insurance will pay for both however I will ask  When doing prior auth

## 2014-02-14 NOTE — Telephone Encounter (Signed)
Pt called wanting to speak to a nurse regarding her having headache medicine. Please call pt # [phone]

## 2014-02-25 ENCOUNTER — Telehealth: Payer: Self-pay | Admitting: Neurology

## 2014-02-25 NOTE — Telephone Encounter (Signed)
Pt called requesting a refill for promethazine 25MG  Pharmacy: [facility] /B [phone]

## 2014-03-04 ENCOUNTER — Encounter: Payer: BC Managed Care – PPO | Admitting: Family

## 2014-05-08 ENCOUNTER — Other Ambulatory Visit: Payer: Self-pay | Admitting: Family Medicine

## 2014-05-09 NOTE — Telephone Encounter (Signed)
Electronic refill request. Last Filled:    2 Syringe 3 RF on 04/20/2013  Please advise.

## 2014-06-01 DIAGNOSIS — B351 Tinea unguium: Secondary | ICD-10-CM

## 2014-06-03 ENCOUNTER — Other Ambulatory Visit: Payer: Self-pay | Admitting: Neurology

## 2014-07-19 ENCOUNTER — Ambulatory Visit: Payer: 59 | Admitting: Podiatry

## 2014-08-09 ENCOUNTER — Ambulatory Visit: Payer: 59 | Admitting: Podiatry

## 2014-08-15 ENCOUNTER — Other Ambulatory Visit: Payer: Self-pay | Admitting: *Deleted

## 2014-08-15 ENCOUNTER — Telehealth: Payer: Self-pay | Admitting: Neurology

## 2014-08-15 DIAGNOSIS — G43009 Migraine without aura, not intractable, without status migrainosus: Secondary | ICD-10-CM

## 2014-08-15 NOTE — Telephone Encounter (Signed)
Pt called and scheduled an appt with Dr Bridget Beck but could not get in until November, her prescription for  Topiramate runs out in August and would like to get refills called in/Dawn CB# [phone]

## 2014-08-30 DIAGNOSIS — R52 Pain, unspecified: Secondary | ICD-10-CM

## 2014-09-15 ENCOUNTER — Other Ambulatory Visit: Payer: Self-pay | Admitting: Neurology

## 2014-09-28 ENCOUNTER — Encounter (HOSPITAL_COMMUNITY): Payer: Self-pay | Admitting: *Deleted

## 2014-09-28 ENCOUNTER — Emergency Department (INDEPENDENT_AMBULATORY_CARE_PROVIDER_SITE_OTHER)
Admission: EM | Admit: 2014-09-28 | Discharge: 2014-09-28 | Disposition: A | Discharge status: Home / Self Care | Payer: Worker's Compensation | Source: Home / Self Care | Attending: Family Medicine | Admitting: Family Medicine

## 2014-09-28 DIAGNOSIS — S6991XA Unspecified injury of right wrist, hand and finger(s), initial encounter: Secondary | ICD-10-CM | POA: Diagnosis not present

## 2014-09-28 DIAGNOSIS — M79673 Pain in unspecified foot: Secondary | ICD-10-CM

## 2014-09-28 NOTE — Discharge Instructions (Signed)
Go to [facility] in am for further care.

## 2014-09-28 NOTE — ED Notes (Addendum)
Pt  Reports      She      Sustained    A  Scrape  On  Her  r pinky      With  A     Dirty  Blade        No  Bleeding         Pt cleaned  The   Finger    Prior  To         Arrival            [facility]        Closed   When  Pt arrived  To  ucc

## 2014-09-28 NOTE — ED Provider Notes (Addendum)
CSN: [id number]     Arrival date & time 09/28/14  1759 History   None    Chief Complaint  Patient presents with  . Finger Injury   (Consider location/radiation/quality/duration/timing/severity/associated sxs/prior Treatment) Patient is a 44 y.o. female presenting with hand pain. The history is provided by the patient.  Hand Pain This is a new problem. The current episode started 6 to 12 hours ago (minor lac to right 5th finger on blade exposure at work , here for eval.). The problem has been rapidly improving. Associated symptoms comments: Cleaned at work and bandaged , no bleeding..    Past Medical History  Diagnosis Date  . Depression   . Migraines     with aura   Past Surgical History  Procedure Laterality Date  . Cervical fusion      x 2   Family History  Problem Relation Age of Onset  . Peripheral vascular disease Father   . Stroke Sister     possible CVA  . Diabetes Mother     type II, diet controlled  . Cancer Maternal Aunt     non hodgkins lymphoma  . Heart disease Maternal Grandmother    Social History  Substance Use Topics  . Smoking status: Former Smoker -- 0.10 packs/day for 3 years    Types: Cigarettes    Quit date: 10/28/2012  . Smokeless tobacco: Former Systems developer    Quit date: 11/10/2012  . Alcohol Use: 0.0 oz/week    0 Standard drinks or equivalent per week     Comment: 1 glass of wine every 2-3 months.   OB History    No data available     Review of Systems  Skin: Positive for wound.    Allergies  Amitriptyline; Lyrica; Methocarbamol; Prozac; and Remeron  Home Medications   Prior to Admission medications   Medication Sig Start Date End Date Taking? Authorizing Provider  Cranberry 125 MG TABS Take 1 tablet by mouth as needed.     Historical Provider, MD  frovatriptan (FROVA) 2.5 MG tablet Take 2.5 mg by mouth as needed for migraine (2 day before onset of headache  and for 6 days total  then stop   # 12). If recurs, may repeat after 2 hours. Max  of 3 tabs in 24 hours.    Historical Provider, MD  meloxicam (MOBIC) 7.5 MG tablet Take 1 tablet (7.5 mg total) by mouth daily. 12/29/13   Debbrah Alar, NP  promethazine (PHENERGAN) 25 MG tablet Take 0.5-1 tablets (12.5-25 mg total) by mouth every 8 (eight) hours as needed for nausea. 10/15/11   Tonia Ghent, MD  promethazine (PHENERGAN) 25 MG tablet Take 1 tablet (25 mg total) by mouth every 6 (six) hours as needed for nausea. 10/22/12   Spencer Copland, MD  rOPINIRole (REQUIP) 0.25 MG tablet TAKE 1 TABLET BY MOUTH 1-2 HOURS PRIOR TO BEDTIME 06/03/14   Pieter Partridge, DO  SUMAtriptan (IMITREX) 100 MG tablet TAKE 1 TABLET BY MOUTH ONCE AS NEEDED FOR MIGRAINE. MAY REPEAT IN 2 HOURS IF HEADACHE PERSISTS 05/09/14   Debbrah Alar, NP  SUMAtriptan Succinate Refill (IMITREX STATDOSE REFILL) 4 MG/0.5ML SOCT Inject 1 Syringe into the skin as needed. 04/20/13   Pieter Partridge, DO  topiramate (TOPAMAX) 100 MG tablet TAKE 2 & 1/2 TABLETS BY MOUTH AT BEDTIME 09/15/14   Pieter Partridge, DO  valACYclovir (VALTREX) 1000 MG tablet Take 2 tablets (2,000 mg total) by mouth 2 (two) times daily. For 1 day.  Repeat as needed with future episodes. 05/25/12   Tonia Ghent, MD   Meds Ordered and Administered this Visit  Medications - No data to display  BP 120/74 mmHg  Pulse 69  Temp(Src) 99.5 F (37.5 C) (Oral)  Resp 16  SpO2 100%  LMP 09/28/2014 No data found.   Physical Exam  Constitutional: She is oriented to person, place, and time. She appears well-developed and well-nourished. No distress.  Musculoskeletal: She exhibits no tenderness.  Neurological: She is alert and oriented to person, place, and time.  Skin: Skin is warm and dry.  No skin injury evident, nvt intact, no wound or bleeding.  Nursing note and vitals reviewed.   ED Course  Procedures (including critical care time)  Labs Review Labs Reviewed  HIV ANTIBODY (ROUTINE TESTING)  HEPATITIS PANEL, ACUTE  RPR    Imaging Review No  results found.   Visual Acuity Review  Right Eye Distance:   Left Eye Distance:   Bilateral Distance:    Right Eye Near:   Left Eye Near:    Bilateral Near:         MDM   1. Finger injury, right, initial encounter    Wound care, tetanus utd.    Billy Fischer, MD 09/28/14 2109  Billy Fischer, MD 09/28/14 2111

## 2014-09-28 NOTE — ED Notes (Signed)
Info  Faxed  To  [facility]

## 2014-09-29 LAB — RPR: RPR: NONREACTIVE

## 2014-09-29 LAB — HEPATITIS PANEL, ACUTE
HCV Ab: <0.1 s/co ratio (ref 0.0–0.9)
HEP A IGM: NEGATIVE
Hep B C IgM: NEGATIVE
Hepatitis B Surface Ag: NEGATIVE

## 2014-09-29 LAB — HIV ANTIBODY (ROUTINE TESTING W REFLEX): HIV Screen 4th Generation wRfx: NONREACTIVE

## 2014-10-11 NOTE — ED Notes (Signed)
Baseline lab reports negative for post injury exposure levels. Patient called and left message that her reports are all negative, and to have labs repeated in 6 months

## 2014-10-28 ENCOUNTER — Ambulatory Visit (INDEPENDENT_AMBULATORY_CARE_PROVIDER_SITE_OTHER): Payer: 59

## 2014-10-28 DIAGNOSIS — Z23 Encounter for immunization: Secondary | ICD-10-CM

## 2014-11-11 ENCOUNTER — Other Ambulatory Visit: Payer: Self-pay | Admitting: Neurology

## 2014-11-11 NOTE — Telephone Encounter (Signed)
Rx sent 

## 2014-12-05 ENCOUNTER — Encounter: Payer: Self-pay | Admitting: Family Medicine

## 2014-12-05 ENCOUNTER — Ambulatory Visit (INDEPENDENT_AMBULATORY_CARE_PROVIDER_SITE_OTHER): Payer: 59 | Admitting: Family Medicine

## 2014-12-05 ENCOUNTER — Encounter: Payer: Self-pay | Admitting: Neurology

## 2014-12-05 ENCOUNTER — Ambulatory Visit (INDEPENDENT_AMBULATORY_CARE_PROVIDER_SITE_OTHER): Payer: 59 | Admitting: Neurology

## 2014-12-05 VITALS — BP 106/70 | HR 78 | Wt 215.0 lb

## 2014-12-05 VITALS — BP 106/70 | HR 78 | Temp 98.8°F | Wt 215.2 lb

## 2014-12-05 DIAGNOSIS — R198 Other specified symptoms and signs involving the digestive system and abdomen: Secondary | ICD-10-CM

## 2014-12-05 DIAGNOSIS — G43719 Chronic migraine without aura, intractable, without status migrainosus: Secondary | ICD-10-CM | POA: Diagnosis not present

## 2014-12-05 DIAGNOSIS — K639 Disease of intestine, unspecified: Secondary | ICD-10-CM | POA: Diagnosis not present

## 2014-12-05 MED ORDER — TOPIRAMATE 100 MG PO TABS: ORAL_TABLET | ORAL | Status: DC

## 2014-12-05 MED ORDER — SUMATRIPTAN SUCCINATE 11 MG/NOSEPC NA EXHP: 11.0000 mg | INHALANT_POWDER | Freq: Once | NASAL | Status: DC

## 2014-12-05 NOTE — Progress Notes (Signed)
NEUROLOGY FOLLOW UP OFFICE NOTE  Bridget Beck [id number]  HISTORY OF PRESENT ILLNESS: UPDATE: Migraines are no longer menstrual and are now chronic again. Duration:  1 to 2 days Frequency:  16 to 20 days per month of headache Current abortive medication:  Sumatriptan 100mg , promethazine Antihypertensive medications:  none Antidepressant medications:  none Anticonvulsant medications:  topiramate 250mg    HISTORY: Onset:  Since childhood. Location:  Usually left sided (rarely right-sided or holocephalic) Quality:  pounding Initial intensity:  10/10 Aura:  no Associated symptoms:  Nausea, photophobia, phonophobia, osmophobia, difficulty seeing (cannot elaborate), talks jibberish, sometimes vomiting Initial duration:  Several hours but dull residual headache for a day or two afterwards Initial frequency:  2-3 times per month (used to be most days of the month up until around a year ago). Triggers/exacerbating factors:  Peanuts, alcohol, change in weather Relieving factors:  none Activity:  Needs to lay down in dark quite room  Past abortive therapy:  [name], sumatriptan 4mg  [place], ibuprofen (takes edge off) Past preventative therapy:  Depakote (side effects), propranolol (unsure of dose.  ineffective), amitriptyline 25mg  (triggered headache), zonisamide 600mg  (stopped being effective), Botox (tried once but then insurance wouldn't cover it)  PAST MEDICAL HISTORY: Past Medical History  Diagnosis Date  . Depression   . Migraines     with aura    MEDICATIONS: Current Outpatient Prescriptions on File Prior to Visit  Medication Sig Dispense Refill  . Cranberry 125 MG TABS Take 1 tablet by mouth as needed.     . promethazine (PHENERGAN) 25 MG tablet Take 1 tablet (25 mg total) by mouth every 6 (six) hours as needed for nausea. 40 tablet 3  . rOPINIRole (REQUIP) 0.25 MG tablet TAKE 1 TABLET BY MOUTH 1-2 HOURS PRIOR TO BEDTIME 30 tablet 1  .  SUMAtriptan (IMITREX) 100 MG tablet TAKE 1 TABLET BY MOUTH ONCE AS NEEDED FOR MIGRAINE. MAY REPEAT IN 2 HOURS IF HEADACHE PERSISTS 10 tablet 5  . valACYclovir (VALTREX) 1000 MG tablet Take 2 tablets (2,000 mg total) by mouth 2 (two) times daily. For 1 day.  Repeat as needed with future episodes. 8 tablet 1   No current facility-administered medications on file prior to visit.    ALLERGIES: Allergies  Allergen Reactions  . Amitriptyline Other (See Comments)    Had severe migraine and pt could not see for 2 hours.  [name] [Pregabalin]     Migraine  . Methocarbamol Other (See Comments)    Stomach upset  . Prozac [Fluoxetine Hcl]     Fatigue   . Remeron [Mirtazapine]     "felt like a zombie"    FAMILY HISTORY: Family History  Problem Relation Age of Onset  . Peripheral vascular disease Father   . Stroke Sister     possible CVA  . Diabetes Mother     type II, diet controlled  . Cancer Maternal Aunt     non hodgkins lymphoma  . Heart disease Maternal Grandmother     SOCIAL HISTORY: Social History   Social History  . Marital Status: Married    Spouse Name: N/A  . Number of Children: 3  . Years of Education: N/A   Occupational History  . CMA    Social History Main Topics  . Smoking status: Former Smoker -- 0.10 packs/day for 3 years    Types: Cigarettes    Quit date: 10/28/2012  . Smokeless tobacco: Former Systems developer    Quit date:  11/10/2012  . Alcohol Use: No  . Drug Use: No  . Sexual Activity:    Partners: Male   Other Topics Concern  . Not on file   Social History Narrative   2 years of college.   Former CMA at [employer]   1st husband died from [employer] in 05/13/10   Remarried    3 kids    REVIEW OF SYSTEMS: Constitutional: No fevers, chills, or sweats, no generalized fatigue, change in appetite Eyes: No visual changes, double vision, eye pain Ear, nose and throat: No hearing loss, ear pain, nasal congestion, sore throat Cardiovascular: No chest pain,  palpitations Respiratory:  No shortness of breath at rest or with exertion, wheezes GastrointestinaI: No nausea, vomiting, diarrhea, abdominal pain, fecal incontinence Genitourinary:  No dysuria, urinary retention or frequency Musculoskeletal:  No neck pain, back pain Integumentary: No rash, pruritus, skin lesions Neurological: as above Psychiatric: No depression, insomnia, anxiety Endocrine: No palpitations, fatigue, diaphoresis, mood swings, change in appetite, change in weight, increased thirst Hematologic/Lymphatic:  No anemia, purpura, petechiae. Allergic/Immunologic: no itchy/runny eyes, nasal congestion, recent allergic reactions, rashes  PHYSICAL EXAM: Filed Vitals:   12/05/14 1515  BP: 106/70  Pulse: 78   General: No acute distress.   Head:  Normocephalic/atraumatic Eyes:  Fundoscopic exam unremarkable without vessel changes, exudates, hemorrhages or papilledema. Neck: supple, no paraspinal tenderness, full range of motion Heart:  Regular rate and rhythm Lungs:  Clear to auscultation bilaterally Back: No paraspinal tenderness Neurological Exam: alert and oriented to person, place, and time. Attention span and concentration intact, recent and remote memory intact, fund of knowledge intact.  Speech fluent and not dysarthric, language intact.  CN II-XII intact. Fundoscopic exam unremarkable without vessel changes, exudates, hemorrhages or papilledema.  Bulk and tone normal, muscle strength 5/5 throughout.  Sensation to light touch, temperature and vibration intact.  Deep tendon reflexes 2+ throughout.  Finger to nose and heel to shin testing intact.  Gait normal  IMPRESSION: Chronic migraine  PLAN: 1.  Will set up for Botox.  Continue topiramate for now. 2.  For abortive therapy, will try Bridget Beck.  Sample provided.  She will still have oral sumatriptan if needed. 3.  Follow up for Botox  15 minutes spent face to face with patient, over 50% spent discussing  management.  Metta Clines, DO  CC:  Elsie Stain, MD

## 2014-12-05 NOTE — Progress Notes (Signed)
Pre visit review using our clinic review tool, if applicable. No additional management support is needed unless otherwise documented below in the visit note.  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   She is seeing neuro later today re: her migraines.  She clearly does worse when the weather changes, ie when a front comes through.   Her father was recently dx'd with oral cancer.  He has radiation pending.  She is worried about him; she'll him later today.   She is worried about her mother's memory.   Her husband was able to lose some weight and get his sugar in check but she has had trouble losing weight.    Change in BMs.  She was attributing this to the topamax.  She tried a probiotic.  She has alternating constipation and diarrhea.  No fevers.  No blood in stool.  No vomiting. No abd pain.  Has been having sx chronically for most of the year, at minimum.  It is noted that the stress of her current situation is not helping in general.    PMH and SH reviewed  ROS: See HPI, otherwise noncontributory.  Meds, vitals, and allergies reviewed.   GEN: nad, alert and oriented HEENT: mucous membranes moist NECK: supple w/o LA CV: rrr.  PULM: ctab, no inc wob ABD: soft, +bs EXT: no edema

## 2014-12-05 NOTE — Patient Instructions (Signed)
Ask Bridget Beck about getting your labs done (the [facility] labs and the labs he needs). The goal is to plan for a lunch at work and try to increase your fiber.   Take care.  Glad to see you.

## 2014-12-05 NOTE — Patient Instructions (Signed)
Migraine Recommendations: 1.  Continue topiramate.  We will set you up for Botox. 2.  When you get a migraine, try Onzetral Xsail nasal spray.  Position in nose and mouth.  Blow once in each nostril as directed.  May repeat once after 2 hours if needed. 3.  Limit use of pain relievers to no more than 2 days out of the week.  These medications include acetaminophen, ibuprofen, triptans and narcotics.  This will help reduce risk of rebound headaches. 4.  Be aware of common food triggers such as processed sweets, processed foods with nitrites (such as deli meat, hot dogs, sausages), foods with MSG, alcohol (such as wine), chocolate, certain cheeses, certain fruits (dried fruits, some citrus fruit), vinegar, diet soda. 4.  Avoid caffeine 5.  Routine exercise 6.  Proper sleep hygiene 7.  Stay adequately hydrated with water 8.  Keep a headache diary. 9.  Maintain proper stress management. 10.  Do not skip meals. 11.  Consider supplements:  Magnesium oxide 400mg  to 600mg  daily, riboflavin 400mg , Coenzyme Q 10 100mg  three times daily 12.  Follow up in 3 months.

## 2014-12-05 NOTE — Addendum Note (Signed)
Addended by: Gerda Diss A on: 12/05/2014 04:06 PM   Modules accepted: Orders

## 2014-12-07 DIAGNOSIS — R198 Other specified symptoms and signs involving the digestive system and abdomen: Secondary | ICD-10-CM | POA: Insufficient documentation

## 2014-12-07 NOTE — Assessment & Plan Note (Signed)
She has no emergent sx, no weight loss, no blood in stool.  Would work on diet first.  The goal is to plan for a lunch at work and try to increase fiber intake.  Can check routine labs in the meantime.  D/w pt.  She agrees.   >25 minutes spent in face to face time with patient, >50% spent in counselling or coordination of care.

## 2014-12-09 ENCOUNTER — Ambulatory Visit (INDEPENDENT_AMBULATORY_CARE_PROVIDER_SITE_OTHER): Payer: 59 | Admitting: Sports Medicine

## 2014-12-09 ENCOUNTER — Other Ambulatory Visit: Payer: Self-pay

## 2014-12-09 DIAGNOSIS — M779 Enthesopathy, unspecified: Secondary | ICD-10-CM

## 2014-12-09 DIAGNOSIS — M25572 Pain in left ankle and joints of left foot: Secondary | ICD-10-CM

## 2014-12-09 DIAGNOSIS — M79672 Pain in left foot: Secondary | ICD-10-CM

## 2014-12-09 NOTE — Progress Notes (Signed)
Patient ID: Bridget Beck, female   DOB: [birth year], 44 y.o.   MRN: [id number] Subjective: Bridget Beck is a 44 y.o. female patient who presents to office for evaluation of Left foot pain. Patient complains of progressive pain especially over the last 2 months in the Left foot at the dorsolateral aspect that is made worse with walking.  Patient has tried icing, changing shoes with no relief in symptoms. Patient denies any other pedal complaints. Denies injury/trip/fall/sprain/any causative factors.   Patient Active Problem List   Diagnosis Date Noted  . Bowel movement symptom 12/07/2014  . Intractable chronic migraine without aura and without status migrainosus 12/05/2014  . Morbid obesity ([place]) 12/01/2013  . Left hip pain 12/01/2013  . Migraine without aura 04/20/2013  . Depression 04/26/2012  . Lumbar back pain with radiculopathy affecting left lower extremity 02/09/2012  . Memory changes 10/17/2011   Current Outpatient Prescriptions on File Prior to Visit  Medication Sig Dispense Refill  . Cranberry 125 MG TABS Take 1 tablet by mouth as needed.     . promethazine (PHENERGAN) 25 MG tablet Take 1 tablet (25 mg total) by mouth every 6 (six) hours as needed for nausea. 40 tablet 3  . rOPINIRole (REQUIP) 0.25 MG tablet TAKE 1 TABLET BY MOUTH 1-2 HOURS PRIOR TO BEDTIME 30 tablet 1  . SUMAtriptan (IMITREX) 100 MG tablet TAKE 1 TABLET BY MOUTH ONCE AS NEEDED FOR MIGRAINE. MAY REPEAT IN 2 HOURS IF HEADACHE PERSISTS 10 tablet 5  . SUMAtriptan Succinate (ONZETRA XSAIL) 11 MG/NOSEPC EXHP Place 11 mg into the nose once. 1 each 0  . topiramate (TOPAMAX) 100 MG tablet 250mg  by mouth per day    . valACYclovir (VALTREX) 1000 MG tablet Take 2 tablets (2,000 mg total) by mouth 2 (two) times daily. For 1 day.  Repeat as needed with future episodes. 8 tablet 1   No current facility-administered medications on file prior to visit.   Allergies  Allergen Reactions  . Amitriptyline Other (See Comments)   Had severe migraine and pt could not see for 2 hours.  Bridget Beck [Pregabalin]     Migraine  . Methocarbamol Other (See Comments)    Stomach upset  . Prozac [Fluoxetine Hcl]     Fatigue   . Remeron [Mirtazapine]     "felt like a zombie"    Objective:  General: Alert and oriented x3 in no acute distress  Dermatology: No open lesions bilateral lower extremities, no webspace macerations, no ecchymosis bilateral, all nails x 10 are well manicured.  Vascular: Dorsalis Pedis and Posterior Tibial pedal pulses 2/4, Capillary Fill Time 3 seconds,(+) pedal hair growth bilateral, no edema bilateral lower extremities, Temperature gradient within normal limits.  Neurology: Johney Maine sensation intact via light touch bilateral.  Musculoskeletal: Mild tenderness with palpation at 4-5 extensor tendons and lateral ankle on Left,No ankle instability. Adductus foot type. No pain with calf compression bilateral. All joints range of motion is within normal limits. Strength within normal limits in all groups bilateral.   Gait: Antalgic gait with increased lateral column loading on left  Xrays  Left Foot 3 views    Impression: Normal osseous mineralization. Joint spaces preserved. No fracture/dislocation. Mild calcaneal spurs. Adductus foot type. Soft tissues within normal limits. No foreign body.  Assessment and Plan: Problem List Items Addressed This Visit    None    Visit Diagnoses    Foot pain, left    -  Primary    Relevant Orders  DG Foot Complete Left    Ankle pain, left        Tendonitis          -Complete examination performed -Xrays reviewed -Discussed treatement options -After verbal consent injected left dorsolateral foot over the 4-5 extensor tendons with 3cc mixture containing 2% lidocaine plain, 0.5% marcaine plain, kenalog 10, and dexmethasone phosphate. Patient tolerated injection well. Advised on post-injection care -Dispensed tri-lock ankle brace for ankle pain and to prevent  overloading the lateral column -Recommended rest, ice, elevation, and anti-inflammatories as needed. -Patient to return to office as needed or sooner if condition worsens.  Bridget Beck, [name]

## 2014-12-16 ENCOUNTER — Other Ambulatory Visit: Payer: Self-pay | Admitting: Neurology

## 2014-12-16 NOTE — Telephone Encounter (Signed)
Last OV: 12/05/14 Next OV: 03/26/14

## 2014-12-30 ENCOUNTER — Other Ambulatory Visit: Payer: Self-pay | Admitting: Sports Medicine

## 2014-12-30 MED ORDER — METHYLPREDNISOLONE 4 MG PO TBPK: ORAL_TABLET | ORAL | Status: DC

## 2015-01-16 ENCOUNTER — Other Ambulatory Visit: Payer: Self-pay | Admitting: Neurology

## 2015-01-17 NOTE — Telephone Encounter (Signed)
Last OV: 12/05/14 Next OV: 03/27/15

## 2015-02-18 DIAGNOSIS — J019 Acute sinusitis, unspecified: Secondary | ICD-10-CM | POA: Diagnosis not present

## 2015-02-18 DIAGNOSIS — R05 Cough: Secondary | ICD-10-CM | POA: Diagnosis not present

## 2015-02-18 DIAGNOSIS — J3089 Other allergic rhinitis: Secondary | ICD-10-CM | POA: Diagnosis not present

## 2015-03-13 ENCOUNTER — Ambulatory Visit (INDEPENDENT_AMBULATORY_CARE_PROVIDER_SITE_OTHER): Payer: 59 | Admitting: Family Medicine

## 2015-03-13 ENCOUNTER — Encounter: Payer: Self-pay | Admitting: Family Medicine

## 2015-03-13 ENCOUNTER — Telehealth: Payer: Self-pay

## 2015-03-13 VITALS — BP 102/68 | HR 74 | Temp 98.5°F | Wt 219.2 lb

## 2015-03-13 DIAGNOSIS — K639 Disease of intestine, unspecified: Secondary | ICD-10-CM | POA: Diagnosis not present

## 2015-03-13 DIAGNOSIS — R635 Abnormal weight gain: Secondary | ICD-10-CM

## 2015-03-13 DIAGNOSIS — R198 Other specified symptoms and signs involving the digestive system and abdomen: Secondary | ICD-10-CM

## 2015-03-13 LAB — COMPREHENSIVE METABOLIC PANEL
ALT: 9 U/L (ref 0–35)
AST: 11 U/L (ref 0–37)
Albumin: 4.3 g/dL (ref 3.5–5.2)
Alkaline Phosphatase: 53 U/L (ref 39–117)
BILIRUBIN TOTAL: 0.3 mg/dL (ref 0.2–1.2)
BUN: 20 mg/dL (ref 6–23)
CO2: 22 meq/L (ref 19–32)
Calcium: 9.2 mg/dL (ref 8.4–10.5)
Chloride: 111 mEq/L (ref 96–112)
Creatinine, Ser: 0.88 mg/dL (ref 0.40–1.20)
GFR: 73.62 mL/min (ref >60.00)
GLUCOSE: 77 mg/dL (ref 70–99)
Potassium: 4 mEq/L (ref 3.5–5.1)
Sodium: 138 mEq/L (ref 135–145)
Total Protein: 7.2 g/dL (ref 6.0–8.3)

## 2015-03-13 LAB — CBC WITH DIFFERENTIAL/PLATELET
Basophils Absolute: 0 10*3/uL (ref 0.0–0.1)
Basophils Relative: 0.5 % (ref 0.0–3.0)
Eosinophils Absolute: 0.2 10*3/uL (ref 0.0–0.7)
Eosinophils Relative: 1.9 % (ref 0.0–5.0)
HEMATOCRIT: 42 % (ref 36.0–46.0)
HEMOGLOBIN: 14.1 g/dL (ref 12.0–15.0)
LYMPHS ABS: 1.9 10*3/uL (ref 0.7–4.0)
Lymphocytes Relative: 20.5 % (ref 12.0–46.0)
MCHC: 33.5 g/dL (ref 30.0–36.0)
MCV: 88.8 fl (ref 78.0–100.0)
Monocytes Absolute: 0.5 10*3/uL (ref 0.1–1.0)
Monocytes Relative: 5.2 % (ref 3.0–12.0)
NEUTROS ABS: 6.7 10*3/uL (ref 1.4–7.7)
Neutrophils Relative %: 71.9 % (ref 43.0–77.0)
Platelets: 266 10*3/uL (ref 150.0–400.0)
RBC: 4.73 Mil/uL (ref 3.87–5.11)
RDW: 13.6 % (ref 11.5–15.5)
WBC: 9.3 10*3/uL (ref 4.0–10.5)

## 2015-03-13 LAB — TSH: TSH: 1.15 u[IU]/mL (ref 0.35–4.50)

## 2015-03-13 NOTE — Progress Notes (Signed)
Pre visit review using our clinic review tool, if applicable. No additional management support is needed unless otherwise documented below in the visit note.  She wanted to discuss her weight.  "I can't lose weight."  She is working on her diet (healthy diet, grilled meats, salads).  Multiple family members are working on their weight.  Her schedule is busy.  Her father has chronic medical issues and she is concerned about him.   "My weight doesn't budge."  She is trying to walk for exercise, weather permitting.  She is drinking water, but no sodas.  Heaviest weight was 330 lbs, years ago.   We talked about options.  She was asking about contrave.    Her migraines are "so/so".  She notes weather changes affect her migraines.  She has seen neuro and has f/u pending.    Meds, vitals, and allergies reviewed.   ROS: See HPI.  Otherwise, noncontributory.  GEN: nad, alert and oriented HEENT: mucous membranes moist NECK: supple w/o LA, no tmg CV: rrr PULM: ctab, no inc wob ABD: soft, +bs EXT: no edema

## 2015-03-13 NOTE — Telephone Encounter (Signed)
-----   Message from Alda Berthold, DO sent at 03/13/2015  3:36 PM EST ----- Please notify patient lab are within normal limits.  Thank you.

## 2015-03-13 NOTE — Patient Instructions (Addendum)
Ask neuro about the contrave.  Keep working on [employer].  Try to get more exercise walking.  Update me as needed.

## 2015-03-13 NOTE — Telephone Encounter (Signed)
Message relayed to patient. Verbalized understanding and denied questions.   

## 2015-03-14 NOTE — Assessment & Plan Note (Signed)
Historically a problem.  Check TSH.  Likely still from caloric imbalance, with life stressors not helping her situation.  dw pt.  She asks about contrave.   I want neuro input before considering.   She'll check with neuro.  Continue work on diet and exercise in the meantime.

## 2015-03-24 MED ORDER — TOPIRAMATE 50 MG PO TABS: 75.0000 mg | ORAL_TABLET | Freq: Every day | ORAL | Status: DC

## 2015-03-24 MED ORDER — SUMATRIPTAN SUCCINATE 11 MG/NOSEPC NA EXHP: 11.0000 mg | INHALANT_POWDER | Freq: Once | NASAL | Status: DC

## 2015-03-24 MED ORDER — ROPINIROLE HCL 0.25 MG PO TABS: 0.2500 mg | ORAL_TABLET | Freq: Every day | ORAL | Status: DC

## 2015-03-27 ENCOUNTER — Ambulatory Visit (INDEPENDENT_AMBULATORY_CARE_PROVIDER_SITE_OTHER): Payer: 59 | Admitting: Neurology

## 2015-03-27 ENCOUNTER — Telehealth: Payer: Self-pay | Admitting: Neurology

## 2015-03-27 ENCOUNTER — Other Ambulatory Visit: Payer: Self-pay | Admitting: Family Medicine

## 2015-03-27 DIAGNOSIS — G43719 Chronic migraine without aura, intractable, without status migrainosus: Secondary | ICD-10-CM

## 2015-03-27 MED ORDER — ONABOTULINUMTOXINA 100 UNITS IJ SOLR
155.0000 [IU] | Freq: Once | INTRAMUSCULAR | Status: AC
  Administered 2015-03-27: 155 [IU] via INTRAMUSCULAR

## 2015-03-27 MED ORDER — NALTREXONE-BUPROPION HCL ER 8-90 MG PO TB12: ORAL_TABLET | ORAL | Status: DC

## 2015-03-27 NOTE — Telephone Encounter (Signed)
Error

## 2015-03-27 NOTE — Progress Notes (Addendum)
See procedure note.  While she was here, she mentioned that she may start taking Contrave for weight loss and was wondering if there was any contraindication with Topamax.  I told her there is no absolute contraindication.  She should just use caution and look out for symptoms of psychomotor slowing, such as drowsiness or slowed thinking.

## 2015-03-27 NOTE — Procedures (Signed)
BOTOX PROCEDURE NOTE  Risks and benefits were discussed with the patient, who appears to understand the discussion and verbal consent was obtained.  200 units were reconstituted with 4 mL 0.9% sodium chloride.  Skin was cleaned with alcohol prior to injection.  A 30-gauge, 0.5-inch needle was used.  Total:  4 mL (200 units)  # of injection sites Muscle   Dose Total 1 each side  Corrugator   0.2 mL (10 units) 1    Procerus   0.1 mL (5 units) 2 each side  Frontalis  0.4 mL (20 units) 4 each side  Temporalis  0.8 mL (40 units) 3 each side  Occipitalis  0.6 mL (30 units) 2 each side  Cervical paraspinals 0.4 mL (20 units) 3 each side  Trapezius  0.6 mL (30 units)    Total 31  Total used  155 units    Total wasted  45 units  The patient tolerated the procedure.    Adam R. Jaffe, DO 

## 2015-03-27 NOTE — Telephone Encounter (Signed)
Patient advised.   Med sent through [facility] to [place].

## 2015-03-27 NOTE — Telephone Encounter (Signed)
Patient checked with neuro about possible contrave use.  Per neuro, would be okay to try, ie no absolute contraindication.  I added order, if patient wants to start then please send in.  Have her update me after about 2 weeks.   Caution for drowsiness or slowed thinking.  Thanks.

## 2015-03-28 ENCOUNTER — Telehealth: Payer: Self-pay | Admitting: *Deleted

## 2015-03-28 NOTE — Telephone Encounter (Signed)
Received faxed form from [place] for PA on Contrave.  PA submitted thru [facility], awaiting response.

## 2015-06-27 ENCOUNTER — Other Ambulatory Visit: Payer: Self-pay | Admitting: Sports Medicine

## 2015-06-27 ENCOUNTER — Telehealth: Payer: Self-pay

## 2015-06-27 DIAGNOSIS — L559 Sunburn, unspecified: Secondary | ICD-10-CM

## 2015-06-27 MED ORDER — SILVER SULFADIAZINE 1 % EX CREA: 1.0000 "application " | TOPICAL_CREAM | Freq: Every day | CUTANEOUS | Status: DC

## 2015-06-27 NOTE — Telephone Encounter (Signed)
Pt left v/m;pt has sunburn at feet and ankles but OTC med not helping and request stronger med sent to pharmacy. I called pt back and podiatrist has already called in med for pt; nothing further needed. Pt will cb if needed.

## 2015-06-28 ENCOUNTER — Telehealth: Payer: Self-pay

## 2015-06-28 NOTE — Telephone Encounter (Signed)
Called [facility] about Botox P.A. Not authorization is needed w/ J code [id number] and DX: [id number]. Call reference number is [id number]

## 2015-06-30 ENCOUNTER — Ambulatory Visit (INDEPENDENT_AMBULATORY_CARE_PROVIDER_SITE_OTHER): Payer: 59 | Admitting: Neurology

## 2015-06-30 DIAGNOSIS — G43009 Migraine without aura, not intractable, without status migrainosus: Secondary | ICD-10-CM | POA: Diagnosis not present

## 2015-06-30 MED ORDER — ONABOTULINUMTOXINA 100 UNITS IJ SOLR
155.0000 [IU] | Freq: Once | INTRAMUSCULAR | Status: AC
  Administered 2015-06-30: 155 [IU] via INTRAMUSCULAR

## 2015-06-30 MED ORDER — ONABOTULINUMTOXINA 100 UNITS IJ SOLR: 200.0000 [IU] | Freq: Once | INTRAMUSCULAR | Status: DC

## 2015-06-30 NOTE — Addendum Note (Signed)
Addended by: Gerda Diss A on: 06/30/2015 01:35 PM   Modules accepted: Orders

## 2015-06-30 NOTE — Progress Notes (Signed)
Botulinum Clinic   Procedure Note Botox  Attending: Dr. Tomi Likens  Preoperative Diagnosis(es): Chronic migraine  Consent obtained from: The patient Benefits discussed included, but were not limited to decreased muscle tightness, increased joint range of motion, and decreased pain.  Risk discussed included, but were not limited pain and discomfort, bleeding, bruising, excessive weakness, venous thrombosis, muscle atrophy and dysphagia.  Anticipated outcomes of the procedure as well as he risks and benefits of the alternatives to the procedure, and the roles and tasks of the personnel to be involved, were discussed with the patient, and the patient consents to the procedure and agrees to proceed. A copy of the patient medication guide was given to the patient which explains the blackbox warning.  Patients identity and treatment sites confirmed Yes.  .  Details of Procedure: Skin was cleaned with alcohol. Prior to injection, the needle plunger was aspirated to make sure the needle was not within a blood vessel.  There was no blood retrieved on aspiration.    Following is a summary of the muscles injected  And the amount of Botulinum toxin used:  Dilution 200 units of Botox was reconstituted with 4 ml of preservative free normal saline. Time of reconstitution: At the time of the office visit (<30 minutes prior to injection)   Injections  155 total units of Botox was injected with a 30 gauge needle.  Injection Sites: L occipitalis: 15 units- 3 sites  R occiptalis: 15 units- 3 sites  L upper trapezius: 15 units- 3 sites R upper trapezius: 15 units- 3 sits          L paraspinal (___mid__level): 10 units- 2 sites R paraspinal (__mid___level): 10 units- 2 sites  Face L frontalis(2 injection sites):10 units   R frontalis(2 injection sites):10 units         L corrugator: 5 units   R corrugator: 5 units           Procerus: 5 units   L temporalis: 20 units R temporalis: 20 units   Agent:   200 units of botulinum Type A (Onobotulinum Toxin type A) was reconstituted with 4 ml of preservative free normal saline.  Time of reconstitution: At the time of the office visit (<30 minutes prior to injection)     Total injected (Units): 155  Total wasted (Units): 45  Patient tolerated procedure well without complications.   Reinjection is anticipated in 3 months.  Adam R. Tomi Likens, DO

## 2015-07-24 ENCOUNTER — Encounter: Payer: Self-pay | Admitting: Neurology

## 2015-07-24 ENCOUNTER — Ambulatory Visit (INDEPENDENT_AMBULATORY_CARE_PROVIDER_SITE_OTHER): Payer: 59 | Admitting: Neurology

## 2015-07-24 VITALS — BP 126/70 | HR 79 | Ht 64.0 in | Wt 213.0 lb

## 2015-07-24 DIAGNOSIS — G43709 Chronic migraine without aura, not intractable, without status migrainosus: Secondary | ICD-10-CM

## 2015-07-24 MED ORDER — SUMATRIPTAN SUCCINATE 6 MG/0.5ML ~~LOC~~ SOCT: SUBCUTANEOUS | Status: DC

## 2015-07-24 NOTE — Progress Notes (Signed)
NEUROLOGY FOLLOW UP OFFICE NOTE  Bridget Beck [id number]  HISTORY OF PRESENT ILLNESS: Bridget Beck is a 45 year old right-handed woman with depression who follows up for migraine and restless leg.  UPDATE: Intensity:  6/10 Duration:  1 day Frequency:  2 days per week Current NSAIDS:  no Current analgesics:  no Current triptans:  sumatriptan 100mg  Current anti-emetic:  promethazine 25mg  Current muscle relaxants:  no Current anti-anxiolytic:  no Current sleep aide:  no Current Antihypertensive medications:  no Current Antidepressant medications:  no Current Anticonvulsant medications:  topiramate 75mg  Current Vitamins/Herbal/Supplements:  no Current Antihistamines/Decongestants:  no Other therapy:  Botox (2 rounds) Other medication:  Requip   HISTORY: Onset:  Since childhood. Location:  Usually left sided (rarely right-sided or holocephalic) Quality:  pounding Initial intensity:  10/10 Aura:  no Associated symptoms:  Nausea, photophobia, phonophobia, osmophobia, difficulty seeing (cannot elaborate), talks jibberish, sometimes vomiting Initial duration:  Several hours but dull residual headache for a day or two afterwards Initial frequency:  2-3 times per month (used to be most days of the month up until around a year ago). Triggers/exacerbating factors:  Peanuts, alcohol, change in weather Relieving factors:  none Activity:  Needs to lay down in dark quite room  Past abortive therapy:  [name], sumatriptan 4mg  [place], ibuprofen (takes edge off), [name] Past preventative therapy:  Depakote (side effects), propranolol (unsure of dose.  ineffective), amitriptyline 25mg  (triggered headache), zonisamide 600mg  (stopped being effective)  Regarding restless leg:  Mirapex caused headaches.  She was started on ropinirole 0.25mg .  This has been effective.  PAST MEDICAL HISTORY: Past Medical History  Diagnosis Date  . Depression   .  Migraines     with aura    MEDICATIONS: Current Outpatient Prescriptions on File Prior to Visit  Medication Sig Dispense Refill  . Cranberry 125 MG TABS Take 1 tablet by mouth as needed.     . promethazine (PHENERGAN) 25 MG tablet Take 1 tablet (25 mg total) by mouth every 6 (six) hours as needed for nausea. 40 tablet 3  . rOPINIRole (REQUIP) 0.25 MG tablet Take 1 tablet (0.25 mg total) by mouth at bedtime. 30 tablet 2  . silver sulfADIAZINE (SILVADENE) 1 % cream Apply 1 application topically daily. 50 g 1  . SUMAtriptan (IMITREX) 100 MG tablet TAKE 1 TABLET BY MOUTH ONCE AS NEEDED FOR MIGRAINE. MAY REPEAT IN 2 HOURS IF HEADACHE PERSISTS 10 tablet 5  . topiramate (TOPAMAX) 50 MG tablet Take 1.5 tablets (75 mg total) by mouth daily. 45 tablet 3  . valACYclovir (VALTREX) 1000 MG tablet Take 2 tablets (2,000 mg total) by mouth 2 (two) times daily. For 1 day.  Repeat as needed with future episodes. 8 tablet 1   No current facility-administered medications on file prior to visit.    ALLERGIES: Allergies  Allergen Reactions  . Amitriptyline Other (See Comments)    Had severe migraine and pt could not see for 2 hours.  [name] [Pregabalin]     Migraine  . Methocarbamol Other (See Comments)    Stomach upset  . Prozac [Fluoxetine Hcl]     Fatigue   . Remeron [Mirtazapine]     "felt like a zombie"    FAMILY HISTORY: Family History  Problem Relation Age of Onset  . Peripheral vascular disease Father   . Stroke Sister     possible CVA  . Diabetes Mother     type II, diet controlled  .  Cancer Maternal Aunt     non hodgkins lymphoma  . Heart disease Maternal Grandmother     SOCIAL HISTORY: Social History   Social History  . Marital Status: Married    Spouse Name: N/A  . Number of Children: 3  . Years of Education: N/A   Occupational History  . CMA    Social History Main Topics  . Smoking status: Former Smoker -- 0.10 packs/day for 3 years    Types: Cigarettes    Quit  date: 10/28/2012  . Smokeless tobacco: Former Systems developer    Quit date: 11/10/2012  . Alcohol Use: No  . Drug Use: No  . Sexual Activity:    Partners: Male   Other Topics Concern  . Not on file   Social History Narrative   2 years of college.   Former CMA at [employer]   1st husband died from [employer] in May 11, 2010   Remarried    3 kids    REVIEW OF SYSTEMS: Constitutional: No fevers, chills, or sweats, no generalized fatigue, change in appetite Eyes: No visual changes, double vision, eye pain Ear, nose and throat: No hearing loss, ear pain, nasal congestion, sore throat Cardiovascular: No chest pain, palpitations Respiratory:  No shortness of breath at rest or with exertion, wheezes GastrointestinaI: No nausea, vomiting, diarrhea, abdominal pain, fecal incontinence Genitourinary:  No dysuria, urinary retention or frequency Musculoskeletal:  No neck pain, back pain Integumentary: No rash, pruritus, skin lesions Neurological: as above Psychiatric: No depression, insomnia, anxiety Endocrine: No palpitations, fatigue, diaphoresis, mood swings, change in appetite, change in weight, increased thirst Hematologic/Lymphatic:  No purpura, petechiae. Allergic/Immunologic: no itchy/runny eyes, nasal congestion, recent allergic reactions, rashes  PHYSICAL EXAM: Filed Vitals:   07/24/15 0904  BP: 126/70  Pulse: 79   General: No acute distress.  Patient appears well-groomed.   Head:  Normocephalic/atraumatic Eyes:  Fundi examined but not visualized Neck: supple, no paraspinal tenderness, full range of motion Heart:  Regular rate and rhythm Lungs:  Clear to auscultation bilaterally Back: No paraspinal tenderness Neurological Exam: alert and oriented to person, place, and time. Attention span and concentration intact, recent and remote memory intact, fund of knowledge intact.  Speech fluent and not dysarthric, language intact.  CN II-XII intact. Bulk and tone normal, muscle strength 5/5 throughout.   Sensation to light touch intact.  Deep tendon reflexes 2+ throughout.  Finger to nose and heel to shin testing intact.  Gait normal  IMPRESSION: Migraine  PLAN: 1.  Will verify dose of sumatriptan injection.  If taking 6mg , then will try Zomig 5mg  NS 2.  Continue topiramate 75mg  at bedtime and Botox 3.  Follow up for next round of botox.  15 minutes spent face to face with patient, over 50% spent discussing management.  Metta Clines, DO  CC:  Elsie Stain, MD

## 2015-07-24 NOTE — Addendum Note (Signed)
Addended by: Gerda Diss A on: 07/24/2015 01:40 PM   Modules accepted: Orders

## 2015-07-24 NOTE — Patient Instructions (Signed)
1.  Will verify dose of sumatriptan shot.  Otherwise, would try Zomig nasal spray 2.  Continue topamax 75mg  at bedtime 3.  Keep headache journal recording days with headache, duration of headache, what you took, monitor triggers (food) 4.  Follow up for next round of Botox

## 2015-07-26 ENCOUNTER — Encounter: Payer: Self-pay | Admitting: Neurology

## 2015-08-11 ENCOUNTER — Other Ambulatory Visit: Payer: Self-pay | Admitting: Sports Medicine

## 2015-08-11 DIAGNOSIS — M79672 Pain in left foot: Secondary | ICD-10-CM

## 2015-08-11 MED ORDER — DICLOFENAC SODIUM 75 MG PO TBEC: 75.0000 mg | DELAYED_RELEASE_TABLET | Freq: Two times a day (BID) | ORAL | Status: DC

## 2015-08-15 ENCOUNTER — Telehealth: Payer: Self-pay

## 2015-08-15 ENCOUNTER — Telehealth: Payer: Self-pay | Admitting: Neurology

## 2015-08-15 NOTE — Telephone Encounter (Signed)
Spoke with patient.

## 2015-08-15 NOTE — Telephone Encounter (Signed)
Returning a call [phone]

## 2015-08-15 NOTE — Telephone Encounter (Signed)
She is referring to the sphenopalatine ganglion block.  Our office is trying to schedule a meeting with the representative.  For some reason, it hasn't been easy to get him in.    We are trying schedule Botox patients now on specific days designated as Botox Clinic, which will fall on Fridays.  We can make an exception

## 2015-08-15 NOTE — Telephone Encounter (Signed)
Info relayed to patient. Will call back to reschedule Botox after taking to manager.

## 2015-08-15 NOTE — Telephone Encounter (Signed)
Pt called to see about a trigeminal nerve block for headaches? Stated it was discussed at last OV, and someone was supposed to call her. Reviewed note, no mention of nerve block noted. Unsure what patient is referring to. Pt also wants to reschedule Botox for 10/06/15. Pt states that she wants her Botox done on a Monday morning. Unsure how to handle this issue? Please advise.

## 2015-08-28 ENCOUNTER — Telehealth: Payer: Self-pay

## 2015-09-04 ENCOUNTER — Other Ambulatory Visit: Payer: Self-pay | Admitting: Neurology

## 2015-09-06 NOTE — Telephone Encounter (Signed)
Opened in error

## 2015-10-03 ENCOUNTER — Ambulatory Visit: Payer: Self-pay | Admitting: Neurology

## 2015-10-06 ENCOUNTER — Ambulatory Visit (INDEPENDENT_AMBULATORY_CARE_PROVIDER_SITE_OTHER): Payer: 59 | Admitting: Neurology

## 2015-10-06 DIAGNOSIS — G43719 Chronic migraine without aura, intractable, without status migrainosus: Secondary | ICD-10-CM

## 2015-10-06 MED ORDER — SUMATRIPTAN SUCCINATE 6 MG/0.5ML ~~LOC~~ SOCT: SUBCUTANEOUS | 5 refills | Status: DC

## 2015-10-06 MED ORDER — ONABOTULINUMTOXINA 100 UNITS IJ SOLR
155.0000 [IU] | Freq: Once | INTRAMUSCULAR | Status: AC
  Administered 2015-10-06: 155 [IU] via INTRAMUSCULAR

## 2015-10-06 MED ORDER — SUMATRIPTAN SUCCINATE 6 MG/0.5ML ~~LOC~~ SOLN: 6.0000 mg | Freq: Once | SUBCUTANEOUS | Status: DC

## 2015-10-06 MED ORDER — TOPIRAMATE 25 MG PO TABS: 75.0000 mg | ORAL_TABLET | Freq: Every day | ORAL | 1 refills | Status: DC

## 2015-10-06 NOTE — Progress Notes (Addendum)
Botulinum Clinic   Procedure Note Botox  Attending: Dr. Tomi Beck  Preoperative Diagnosis(es): Chronic migraine  Consent obtained from: The patient Benefits discussed included, but were not limited to decreased muscle tightness, increased joint range of motion, and decreased pain.  Risk discussed included, but were not limited pain and discomfort, bleeding, bruising, excessive weakness, venous thrombosis, muscle atrophy and dysphagia.  Anticipated outcomes of the procedure as well as he risks and benefits of the alternatives to the procedure, and the roles and tasks of the personnel to be involved, were discussed with the patient, and the patient consents to the procedure and agrees to proceed. A copy of the patient medication guide was given to the patient which explains the blackbox warning.  Patients identity and treatment sites confirmed Yes.  .  Details of Procedure: Skin was cleaned with alcohol. Prior to injection, the needle plunger was aspirated to make sure the needle was not within a blood vessel.  There was no blood retrieved on aspiration.    Following is a summary of the muscles injected  And the amount of Botulinum toxin used:  Dilution 200 units of Botox was reconstituted with 4 ml of preservative free normal saline. Time of reconstitution: At the time of the office visit (<30 minutes prior to injection)   Injections  155 total units of Botox was injected with a 30 gauge needle.  Injection Sites: L occipitalis: 15 units- 3 sites  R occiptalis: 15 units- 3 sites  L upper trapezius: 15 units- 3 sites R upper trapezius: 15 units- 3 sits          L paraspinal: 10 units- 2 sites R paraspinal: 10 units- 2 sites  Face L frontalis(2 injection sites):10 units   R frontalis(2 injection sites):10 units         L corrugator: 5 units   R corrugator: 5 units           Procerus: 5 units   L temporalis: 20 units R temporalis: 20 units   Agent:  200 units of botulinum Type A  (Onobotulinum Toxin type A) was reconstituted with 4 ml of preservative free normal saline.  Time of reconstitution: At the time of the office visit (<30 minutes prior to injection)     Total injected (Units): 155  Total wasted (Units): 5  Patient tolerated procedure well without complications.   Reinjection is anticipated in 3 months.   NOTE: June:  12 headache days July: 5 headache days August: 7 headache days Sept (so far): 2 headache days.  She does not like the [facility] and would rather use the traditional vials.  We will make that change.  Bridget Beck R. Bridget Likens, DO

## 2015-10-30 ENCOUNTER — Other Ambulatory Visit: Payer: Self-pay

## 2015-10-30 ENCOUNTER — Ambulatory Visit (INDEPENDENT_AMBULATORY_CARE_PROVIDER_SITE_OTHER): Payer: 59

## 2015-10-30 DIAGNOSIS — Z23 Encounter for immunization: Secondary | ICD-10-CM | POA: Diagnosis not present

## 2015-10-30 MED ORDER — AZITHROMYCIN 1 G PO PACK: 1.0000 g | PACK | Freq: Once | ORAL | 0 refills | Status: AC

## 2015-12-23 ENCOUNTER — Other Ambulatory Visit: Payer: Self-pay | Admitting: Sports Medicine

## 2015-12-23 MED ORDER — AZITHROMYCIN 250 MG PO TABS: ORAL_TABLET | ORAL | 0 refills | Status: DC

## 2015-12-23 NOTE — Progress Notes (Signed)
Z pak rx for patient

## 2016-01-05 ENCOUNTER — Ambulatory Visit (INDEPENDENT_AMBULATORY_CARE_PROVIDER_SITE_OTHER): Payer: 59 | Admitting: Neurology

## 2016-01-05 DIAGNOSIS — G43719 Chronic migraine without aura, intractable, without status migrainosus: Secondary | ICD-10-CM | POA: Diagnosis not present

## 2016-01-05 DIAGNOSIS — Z13220 Encounter for screening for lipoid disorders: Secondary | ICD-10-CM | POA: Diagnosis not present

## 2016-01-05 DIAGNOSIS — Z01419 Encounter for gynecological examination (general) (routine) without abnormal findings: Secondary | ICD-10-CM | POA: Diagnosis not present

## 2016-01-05 DIAGNOSIS — N920 Excessive and frequent menstruation with regular cycle: Secondary | ICD-10-CM | POA: Diagnosis not present

## 2016-01-05 DIAGNOSIS — Z124 Encounter for screening for malignant neoplasm of cervix: Secondary | ICD-10-CM | POA: Diagnosis not present

## 2016-01-05 MED ORDER — ONABOTULINUMTOXINA 100 UNITS IJ SOLR
155.0000 [IU] | Freq: Once | INTRAMUSCULAR | Status: AC
  Administered 2016-01-05: 155 [IU] via INTRAMUSCULAR

## 2016-01-05 MED ORDER — SUMATRIPTAN SUCCINATE 100 MG PO TABS: 100.0000 mg | ORAL_TABLET | ORAL | 5 refills | Status: DC

## 2016-01-05 NOTE — Progress Notes (Signed)
Botulinum Clinic   Procedure Note Botox  Attending: Dr. Metta Clines  Preoperative Diagnosis(es): Chronic migraine  Consent obtained from: The patient Benefits discussed included, but were not limited to decreased muscle tightness, increased joint range of motion, and decreased pain.  Risk discussed included, but were not limited pain and discomfort, bleeding, bruising, excessive weakness, venous thrombosis, muscle atrophy and dysphagia.  Anticipated outcomes of the procedure as well as he risks and benefits of the alternatives to the procedure, and the roles and tasks of the personnel to be involved, were discussed with the patient, and the patient consents to the procedure and agrees to proceed. A copy of the patient medication guide was given to the patient which explains the blackbox warning.  Patients identity and treatment sites confirmed Yes.  .  Details of Procedure: Skin was cleaned with alcohol. Prior to injection, the needle plunger was aspirated to make sure the needle was not within a blood vessel.  There was no blood retrieved on aspiration.    Following is a summary of the muscles injected  And the amount of Botulinum toxin used:  Dilution 200 units of Botox was reconstituted with 4 ml of preservative free normal saline. Time of reconstitution: At the time of the office visit (<30 minutes prior to injection)   Injections  155 total units of Botox was injected with a 30 gauge needle.  Injection Sites: L occipitalis: 15 units- 3 sites  R occiptalis: 15 units- 3 sites  L upper trapezius: 15 units- 3 sites R upper trapezius: 15 units- 3 sits          L paraspinal: 10 units- 2 sites R paraspinal: 10 units- 2 sites  Face L frontalis(2 injection sites):10 units   R frontalis(2 injection sites):10 units         L corrugator: 5 units   R corrugator: 5 units           Procerus: 5 units   L temporalis: 20 units R temporalis: 20 units   Agent:  200 units of botulinum Type  A (Onobotulinum Toxin type A) was reconstituted with 4 ml of preservative free normal saline.  Time of reconstitution: At the time of the office visit (<30 minutes prior to injection)     Total injected (Units): 155  Total wasted (Units): 30  Patient tolerated procedure well without complications.   Reinjection is anticipated in 3 months. Return to clinic in January 2018.  Shihab States R. Tomi Likens, DO

## 2016-01-08 DIAGNOSIS — Z131 Encounter for screening for diabetes mellitus: Secondary | ICD-10-CM | POA: Diagnosis not present

## 2016-01-08 DIAGNOSIS — Z01419 Encounter for gynecological examination (general) (routine) without abnormal findings: Secondary | ICD-10-CM | POA: Diagnosis not present

## 2016-01-08 DIAGNOSIS — Z13220 Encounter for screening for lipoid disorders: Secondary | ICD-10-CM | POA: Diagnosis not present

## 2016-01-08 DIAGNOSIS — Z124 Encounter for screening for malignant neoplasm of cervix: Secondary | ICD-10-CM | POA: Diagnosis not present

## 2016-01-08 DIAGNOSIS — N920 Excessive and frequent menstruation with regular cycle: Secondary | ICD-10-CM | POA: Diagnosis not present

## 2016-01-08 DIAGNOSIS — R32 Unspecified urinary incontinence: Secondary | ICD-10-CM | POA: Diagnosis not present

## 2016-01-12 ENCOUNTER — Ambulatory Visit: Payer: Self-pay | Admitting: Neurology

## 2016-02-05 ENCOUNTER — Telehealth: Payer: Self-pay | Admitting: Neurology

## 2016-02-05 ENCOUNTER — Ambulatory Visit (INDEPENDENT_AMBULATORY_CARE_PROVIDER_SITE_OTHER): Payer: 59 | Admitting: Neurology

## 2016-02-05 ENCOUNTER — Encounter: Payer: Self-pay | Admitting: Neurology

## 2016-02-05 VITALS — BP 104/72 | HR 87 | Ht 64.0 in | Wt 215.9 lb

## 2016-02-05 DIAGNOSIS — G43719 Chronic migraine without aura, intractable, without status migrainosus: Secondary | ICD-10-CM

## 2016-02-05 DIAGNOSIS — G43009 Migraine without aura, not intractable, without status migrainosus: Secondary | ICD-10-CM

## 2016-02-05 MED ORDER — PROMETHAZINE HCL 25 MG PO TABS: 25.0000 mg | ORAL_TABLET | Freq: Four times a day (QID) | ORAL | 3 refills | Status: DC | PRN

## 2016-02-05 MED ORDER — PREDNISONE 10 MG PO TABS: ORAL_TABLET | ORAL | 0 refills | Status: DC

## 2016-02-05 MED ORDER — TOPIRAMATE 25 MG PO TABS: 75.0000 mg | ORAL_TABLET | Freq: Every day | ORAL | 3 refills | Status: DC

## 2016-02-05 NOTE — Telephone Encounter (Signed)
PT called and asked if you could call her back regarding issue that was discussed/Dawn  CB# [phone]

## 2016-02-05 NOTE — Patient Instructions (Signed)
1.  To help break this persistent headache, I will prescribe you prednisone 10mg  tablet taper.  Take 6tabs x1day, then 5tabs x1day, then 4tabs x1day, then 3tabs x1day, then 2tabs x1day, then 1tab x1day, then STOP 2.  Continue topiramate 75mg  at bedtime 3.  Will refill promethazine 4.  Follow up in 6 months.

## 2016-02-05 NOTE — Progress Notes (Signed)
NEUROLOGY FOLLOW UP OFFICE NOTE  Meris Borland Degen [id number]  HISTORY OF PRESENT ILLNESS: Bridget Beck is a 46 year old right-handed woman with depression who follows up for migraine and restless leg.   UPDATE: Intensity:  6/10 Duration:  30-45 minutes Frequency:  Once over past 6 months. However, she has had right sided headache since Saturday.  She notes some congestion.  She tried sumatriptan on Saturday which made it worse.  It was severe yesterday but now a moderate pain. Current NSAIDS:  no Current analgesics:  no Current triptans:  sumatriptan 100mg  Current anti-emetic:  promethazine 25mg  Current muscle relaxants:  no Current anti-anxiolytic:  no Current sleep aide:  no Current Antihypertensive medications:  no Current Antidepressant medications:  no Current Anticonvulsant medications:  topiramate 75mg  Current Vitamins/Herbal/Supplements:  no Current Antihistamines/Decongestants:  no Other therapy:  Botox (4 rounds) Other medication:  Requip    HISTORY: Onset:  Since childhood. Location:  Usually left sided (rarely right-sided or holocephalic) Quality:  pounding Initial intensity:  10/10; June: 6/10 Aura:  no Associated symptoms:  Nausea, photophobia, phonophobia, osmophobia, difficulty seeing (cannot elaborate), talks jibberish, sometimes vomiting Initial duration:  Several hours but dull residual headache for a day or two afterwards; June: 1 day Initial frequency:  2-3 times per month (used to be most days of the month up until around a year ago); June: 2 days per week. Triggers/exacerbating factors:  Peanuts, alcohol, change in weather Relieving factors:  none Activity:  Needs to lay down in dark quite room   Past abortive therapy:  [name], sumatriptan 4mg  [place], ibuprofen (takes edge off), [name] Past preventative therapy:  Depakote (side effects), propranolol (unsure of dose.  ineffective), amitriptyline 25mg  (triggered  headache), zonisamide 600mg  (stopped being effective)   Regarding restless leg:  Mirapex caused headaches.  She was started on ropinirole 0.25mg .  This has been effective.  PAST MEDICAL HISTORY: Past Medical History:  Diagnosis Date  . Depression   . Migraines    with aura    MEDICATIONS: Current Outpatient Prescriptions on File Prior to Visit  Medication Sig Dispense Refill  . SUMAtriptan (IMITREX) 100 MG tablet Take 1 tablet (100 mg total) by mouth as directed. May repeat in 2 hours if headache persists or recurs. 10 tablet 5  . SUMAtriptan Succinate 6 MG/0.5ML SOCT Inject 6 Mg into skin at earliest onset of migraine, may repeat in 1 hour if headache persists, or reoccurs 6 mL 5  . valACYclovir (VALTREX) 1000 MG tablet Take 2 tablets (2,000 mg total) by mouth 2 (two) times daily. For 1 day.  Repeat as needed with future episodes. 8 tablet 1  . Cranberry 125 MG TABS Take 1 tablet by mouth as needed.     . diclofenac (VOLTAREN) 75 MG EC tablet Take 1 tablet (75 mg total) by mouth 2 (two) times daily. (Patient not taking: Reported on 02/05/2016) 30 tablet 0  . silver sulfADIAZINE (SILVADENE) 1 % cream Apply 1 application topically daily. (Patient not taking: Reported on 02/05/2016) 50 g 1   No current facility-administered medications on file prior to visit.     ALLERGIES: Allergies  Allergen Reactions  . Amitriptyline Other (See Comments)    Had severe migraine and pt could not see for 2 hours.  [name] [Pregabalin]     Migraine  . Methocarbamol Other (See Comments)    Stomach upset  . Prozac [Fluoxetine Hcl]     Fatigue   . Remeron [  Mirtazapine]     "felt like a zombie"    FAMILY HISTORY: Family History  Problem Relation Age of Onset  . Peripheral vascular disease Father   . Stroke Sister     possible CVA  . Diabetes Mother     type II, diet controlled  . Cancer Maternal Aunt     non hodgkins lymphoma  . Heart disease Maternal Grandmother     SOCIAL  HISTORY: Social History   Social History  . Marital status: Married    Spouse name: N/A  . Number of children: 3  . Years of education: N/A   Occupational History  . [employer]   Social History Main Topics  . Smoking status: Former Smoker    Packs/day: 0.10    Years: 3.00    Types: Cigarettes    Quit date: 10/28/2012  . Smokeless tobacco: Former Systems developer    Quit date: 11/10/2012  . Alcohol use No  . Drug use: No  . Sexual activity: Yes    Partners: Male   Other Topics Concern  . Not on file   Social History Narrative   2 years of college.   Former CMA at [employer]   1st husband died from [employer] in 2010/05/07   Remarried    3 kids    REVIEW OF SYSTEMS: Constitutional: No fevers, chills, or sweats, no generalized fatigue, change in appetite Eyes: No visual changes, double vision, eye pain Ear, nose and throat: nasal congestion Cardiovascular: No chest pain, palpitations Respiratory:  No shortness of breath at rest or with exertion, wheezes GastrointestinaI: No nausea, vomiting, diarrhea, abdominal pain, fecal incontinence Genitourinary:  No dysuria, urinary retention or frequency Musculoskeletal:  No neck pain, back pain Integumentary: No rash, pruritus, skin lesions Neurological: as above Psychiatric: No depression, insomnia, anxiety Endocrine: No palpitations, fatigue, diaphoresis, mood swings, change in appetite, change in weight, increased thirst Hematologic/Lymphatic:  No purpura, petechiae. Allergic/Immunologic: no itchy/runny eyes, nasal congestion, recent allergic reactions, rashes  PHYSICAL EXAM: Vitals:   02/05/16 1104  BP: 104/72  Pulse: 87   General: No acute distress.  Patient appears well-groomed.   Head:  Normocephalic/atraumatic Eyes:  Fundi examined but not visualized Neck: supple, no paraspinal tenderness, full range of motion Heart:  Regular rate and rhythm Lungs:  Clear to auscultation bilaterally Back: No paraspinal  tenderness Neurological Exam: alert and oriented to person, place, and time. Attention span and concentration intact, recent and remote memory intact, fund of knowledge intact.  Speech fluent and not dysarthric, language intact.  CN II-XII intact. Bulk and tone normal, muscle strength 5/5 throughout.  Sensation to light touch, temperature and vibration intact.  Deep tendon reflexes 2+ throughout, toes downgoing.  Finger to nose and heel to shin testing intact.  Gait normal, Romberg negative.  IMPRESSION: Migraine  PLAN: 1.  Will prescribe prednisone taper to break persistent headache 2.  Topiramate 75mg  at bedtime 3.  Botox (next round on 3/9) 4.  Sumatriptan as needed  Metta Clines, DO  CC:  Elsie Stain, MD

## 2016-02-06 NOTE — Telephone Encounter (Signed)
Returned patients call.

## 2016-03-25 ENCOUNTER — Other Ambulatory Visit: Payer: Self-pay | Admitting: *Deleted

## 2016-03-25 MED ORDER — PREDNISONE 10 MG PO TABS: ORAL_TABLET | ORAL | 0 refills | Status: DC

## 2016-04-05 ENCOUNTER — Ambulatory Visit (INDEPENDENT_AMBULATORY_CARE_PROVIDER_SITE_OTHER): Payer: 59 | Admitting: Neurology

## 2016-04-05 DIAGNOSIS — G43709 Chronic migraine without aura, not intractable, without status migrainosus: Secondary | ICD-10-CM

## 2016-04-05 DIAGNOSIS — IMO0002 Reserved for concepts with insufficient information to code with codable children: Secondary | ICD-10-CM

## 2016-04-05 MED ORDER — GABAPENTIN 100 MG PO CAPS: ORAL_CAPSULE | ORAL | 0 refills | Status: DC

## 2016-04-05 MED ORDER — ONABOTULINUMTOXINA 100 UNITS IJ SOLR
155.0000 [IU] | Freq: Once | INTRAMUSCULAR | Status: AC
  Administered 2016-04-05: 155 [IU] via INTRAMUSCULAR

## 2016-04-05 NOTE — Progress Notes (Signed)
[facility]   Procedure Note Botox  Attending: Dr. Tomi Likens  Preoperative Diagnosis(es): Chronic migraine  Consent obtained from: The patient Benefits discussed included, but were not limited to decreased muscle tightness, increased joint range of motion, and decreased pain.  Risk discussed included, but were not limited pain and discomfort, bleeding, bruising, excessive weakness, venous thrombosis, muscle atrophy and dysphagia.  Anticipated outcomes of the procedure as well as he risks and benefits of the alternatives to the procedure, and the roles and tasks of the personnel to be involved, were discussed with the patient, and the patient consents to the procedure and agrees to proceed. A copy of the patient medication guide was given to the patient which explains the blackbox warning.  Patients identity and treatment sites confirmed Yes.  .  Details of Procedure: Skin was cleaned with alcohol. Prior to injection, the needle plunger was aspirated to make sure the needle was not within a blood vessel.  There was no blood retrieved on aspiration.    Following is a summary of the muscles injected  And the amount of Botulinum toxin used:  Dilution 200 units of Botox was reconstituted with 4 ml of preservative free normal saline. Time of reconstitution: At the time of the office visit (<30 minutes prior to injection)   Injections  155 total units of Botox was injected with a 30 gauge needle.  Injection Sites: L occipitalis: 15 units- 3 sites  R occiptalis: 15 units- 3 sites  L upper trapezius: 15 units- 3 sites R upper trapezius: 15 units- 3 sits          L paraspinal: 10 units- 2 sites R paraspinal: 10 units- 2 sites  Face L frontalis(2 injection sites):10 units   R frontalis(2 injection sites):10 units         L corrugator: 5 units   R corrugator: 5 units           Procerus: 5 units   L temporalis: 20 units R temporalis: 20 units   Agent:  200 units of botulinum Type A  (Onobotulinum Toxin type A) was reconstituted with 4 ml of preservative free normal saline.  Time of reconstitution: At the time of the office visit (<30 minutes prior to injection)     Total injected (Units): 155  Total wasted (Units): none wasted  Patient tolerated procedure well without complications.   Reinjection is anticipated in 3 months.  NOTE:  For restless leg syndrome, start gabapentin 100mg  at bedtime, titrating to 300mg  at bedtime as needed.  Bridget Beck R. Tomi Likens, DO

## 2016-04-08 ENCOUNTER — Other Ambulatory Visit: Payer: Self-pay | Admitting: Podiatry

## 2016-04-08 MED ORDER — DOXYCYCLINE HYCLATE 100 MG PO TABS: 100.0000 mg | ORAL_TABLET | Freq: Two times a day (BID) | ORAL | 0 refills | Status: DC

## 2016-04-21 ENCOUNTER — Emergency Department (HOSPITAL_COMMUNITY): Payer: 59

## 2016-04-21 ENCOUNTER — Emergency Department (HOSPITAL_COMMUNITY)
Admission: EM | Admit: 2016-04-21 | Discharge: 2016-04-21 | Disposition: A | Discharge status: Home / Self Care | Payer: 59 | Attending: Emergency Medicine | Admitting: Emergency Medicine

## 2016-04-21 ENCOUNTER — Encounter (HOSPITAL_COMMUNITY): Payer: Self-pay | Admitting: Emergency Medicine

## 2016-04-21 DIAGNOSIS — Z79899 Other long term (current) drug therapy: Secondary | ICD-10-CM | POA: Diagnosis not present

## 2016-04-21 DIAGNOSIS — M5414 Radiculopathy, thoracic region: Secondary | ICD-10-CM | POA: Diagnosis not present

## 2016-04-21 DIAGNOSIS — M545 Low back pain: Secondary | ICD-10-CM | POA: Diagnosis not present

## 2016-04-21 DIAGNOSIS — Z87891 Personal history of nicotine dependence: Secondary | ICD-10-CM | POA: Diagnosis not present

## 2016-04-21 DIAGNOSIS — M549 Dorsalgia, unspecified: Secondary | ICD-10-CM | POA: Diagnosis not present

## 2016-04-21 DIAGNOSIS — M546 Pain in thoracic spine: Secondary | ICD-10-CM | POA: Diagnosis not present

## 2016-04-21 MED ORDER — PREDNISONE 10 MG (21) PO TBPK: ORAL_TABLET | Freq: Every day | ORAL | 0 refills | Status: DC

## 2016-04-21 MED ORDER — OXYCODONE-ACETAMINOPHEN 5-325 MG PO TABS
1.0000 | ORAL_TABLET | Freq: Once | ORAL | Status: AC
  Administered 2016-04-21: 1 via ORAL
  Filled 2016-04-21: qty 1

## 2016-04-21 MED ORDER — HYDROCODONE-ACETAMINOPHEN 5-325 MG PO TABS
1.0000 | ORAL_TABLET | Freq: Four times a day (QID) | ORAL | 0 refills | Status: DC | PRN
Start: 2016-04-21 — End: 2016-04-30

## 2016-04-21 MED ORDER — HYDROMORPHONE HCL 1 MG/ML IJ SOLN
1.0000 mg | Freq: Once | INTRAMUSCULAR | Status: AC
  Administered 2016-04-21: 1 mg via INTRAMUSCULAR
  Filled 2016-04-21: qty 1

## 2016-04-21 NOTE — ED Provider Notes (Signed)
[facility] DEPT Provider Note   CSN: [id number] Arrival date & time: 04/21/16  1036     History   Chief Complaint Chief Complaint  Patient presents with  . Back Pain    HPI Bridget Beck is a 46 y.o. female.  HPI Bridget Beck is a 46 y.o. female presents to emergency department complaining of back pain. Patient states that she has history of neck and back pain in the past, with 2 prior cervical spine surgeries done at [facility]. States always has "nagging pain in the back" that she is managing with Tylenol and Motrin. She states yesterday she was in the shower and moved a certain way and felt a pop in her mid back. States there was associated severe pain with the pop that "brought me to my knees." Since then she reports increased pain with radiation down left leg. She went to [facility] today and was sent here to rule out cauda "equina. She was noted to have decreased strength in the left leg at the office and mentioned possible urinary incontinence. To me patient states that she did not have incontinence but for the last several years unable to control her urine when coughing and sneezing. She has been urinating normally since yesterday and has not had a bowel incontinence. She reports burning sensation in left leg. Denies any weakness in the leg. She states pain is worsened with any movement, walking, and when taking a deep breath. Nothing making it better.   Past Medical History:  Diagnosis Date  . Depression   . Migraines    with aura    Patient Active Problem List   Diagnosis Date Noted  . Bowel movement symptom 12/07/2014  . Intractable chronic migraine without aura and without status migrainosus 12/05/2014  . Weight gain 12/01/2013  . Left hip pain 12/01/2013  . Migraine without aura 04/20/2013  . Depression 04/26/2012  . Lumbar back pain with radiculopathy affecting left lower extremity 02/09/2012  . Memory changes 10/17/2011    Past Surgical  History:  Procedure Laterality Date  . CERVICAL FUSION     x 2    OB History    No data available       Home Medications    Prior to Admission medications   Medication Sig Start Date End Date Taking? Authorizing Provider  doxycycline (VIBRA-TABS) 100 MG tablet Take 1 tablet (100 mg total) by mouth 2 (two) times daily. 04/08/16   Gardiner Barefoot, [name]  gabapentin (NEURONTIN) 100 MG capsule Take 1 capsule at bedtime for 7 days, then increase to 2 capsules at bedtime for 7 days, then increase to 3 capsules at bedtime 04/05/16   Pieter Partridge, DO  promethazine (PHENERGAN) 25 MG tablet Take 1 tablet (25 mg total) by mouth every 6 (six) hours as needed for nausea. 02/05/16   Pieter Partridge, DO  SUMAtriptan (IMITREX) 100 MG tablet Take 1 tablet (100 mg total) by mouth as directed. May repeat in 2 hours if headache persists or recurs. 01/05/16   Pieter Partridge, DO  SUMAtriptan Succinate 6 MG/0.5ML SOCT Inject 6 Mg into skin at earliest onset of migraine, may repeat in 1 hour if headache persists, or reoccurs 10/06/15   Pieter Partridge, DO  topiramate (TOPAMAX) 25 MG tablet Take 3 tablets (75 mg total) by mouth at bedtime. 02/05/16   Pieter Partridge, DO  valACYclovir (VALTREX) 1000 MG tablet Take 2 tablets (2,000 mg total) by mouth 2 (two)  times daily. For 1 day.  Repeat as needed with future episodes. 05/25/12   Tonia Ghent, MD    Family History Family History  Problem Relation Age of Onset  . Peripheral vascular disease Father   . Stroke Sister     possible CVA  . Diabetes Mother     type II, diet controlled  . Cancer Maternal Aunt     non hodgkins lymphoma  . Heart disease Maternal Grandmother     Social History Social History  Substance Use Topics  . Smoking status: Former Smoker    Packs/day: 0.10    Years: 3.00    Types: Cigarettes    Quit date: 10/28/2012  . Smokeless tobacco: Former Systems developer    Quit date: 11/10/2012  . Alcohol use No     Allergies   Amitriptyline; Lyrica [pregabalin];  Methocarbamol; Prozac [fluoxetine hcl]; and Remeron [mirtazapine]   Review of Systems Review of Systems  Constitutional: Negative for chills and fever.  Respiratory: Negative for cough, chest tightness and shortness of breath.   Cardiovascular: Negative for chest pain, palpitations and leg swelling.  Gastrointestinal: Negative for abdominal pain, diarrhea, nausea and vomiting.  Genitourinary: Negative for dysuria, flank pain and pelvic pain.  Musculoskeletal: Positive for arthralgias and back pain. Negative for myalgias, neck pain and neck stiffness.  Skin: Negative for rash.  Neurological: Positive for numbness. Negative for dizziness, weakness and headaches.  All other systems reviewed and are negative.    Physical Exam Updated Vital Signs BP 121/77 (BP Location: Left Arm)   Pulse 78   Temp 98.1 F (36.7 C) (Oral)   Resp (!) 22   SpO2 100%   Physical Exam  Constitutional: She appears well-developed and well-nourished. No distress.  HENT:  Head: Normocephalic.  Eyes: Conjunctivae are normal.  Neck: Neck supple.  Cardiovascular: Normal rate, regular rhythm and normal heart sounds.   Pulmonary/Chest: Effort normal and breath sounds normal. No respiratory distress. She has no wheezes. She has no rales.  Abdominal: Soft. Bowel sounds are normal. She exhibits no distension. There is no tenderness. There is no rebound.  Musculoskeletal: She exhibits no edema.  TTM over midline lower thoracic and upper lumbar spine. Pain with left straight leg raise  Neurological: She is alert.  5/5 and equal lower extremity strength. 2+ and equal patellar reflexes bilaterally. Pt able to dorsiflex bilateral toes and feet with good strength against resistance. Equal sensation bilaterally over thighs and lower legs.   Skin: Skin is warm and dry.  Psychiatric: She has a normal mood and affect. Her behavior is normal.  Nursing note and vitals reviewed.    ED Treatments / Results  Labs (all labs  ordered are listed, but only abnormal results are displayed) Labs Reviewed - No data to display  EKG  EKG Interpretation None       Radiology Dg Chest 2 View  Result Date: 04/21/2016 CLINICAL DATA:  Has had back pain for a year and yesterday she was in shower and heard a crack Now having numbness in leg and groin ? Incontinence, and she saw dr today and Sent here for further tests EXAM: CHEST  2 VIEW COMPARISON:  None. FINDINGS: The heart size and mediastinal contours are within normal limits. Both lungs are clear. The visualized skeletal structures are unremarkable. Previous cervical fusion. IMPRESSION: No active cardiopulmonary disease. Electronically Signed   By: Nolon Nations M.D.   On: 04/21/2016 12:05   Mr Thoracic Spine Wo Contrast  Result Date: 04/21/2016  CLINICAL DATA:  Back pain for one year.  Acute worsening yesterday. EXAM: MRI THORACIC AND LUMBAR SPINE WITHOUT CONTRAST TECHNIQUE: Multiplanar and multiecho pulse sequences of the thoracic and lumbar spine were obtained without intravenous contrast. COMPARISON:  MRI lumbar spine 05/28/2005 FINDINGS: MRI THORACIC SPINE FINDINGS Alignment:  Physiologic. Vertebrae: No fracture, evidence of discitis, or bone lesion. Cord:  Normal signal and morphology. Paraspinal and other soft tissues: Negative. Disc levels: The individual disc spaces are examined as follows: T1-2:  Normal. T2-3:  Annular bulge.  No impingement. T3-4:  Annular bulge.  No impingement. T4-5:  Annular bulge.  No impingement. T5-6:  Tiny protrusion in the midline.  No impingement. T6-7:  Tiny protrusion in the midline.  No impingement. T7-8:  LEFT paracentral protrusion.  No impingement. T8-9: LEFT paracentral extrusion. Effacement anterior subarachnoid space. Slight cord displacement. No definite foraminal narrowing. T9-10:  Normal. T10-11:  Mild bulge.  No impingement. T11-12:  Normal. T12-L1:  Normal. MRI LUMBAR SPINE FINDINGS Segmentation:  Normal. Alignment:  Physiologic.  Vertebrae:  No fracture, evidence of discitis, or bone lesion. Conus medullaris: Extends to the L1-L2 level and appears normal. Paraspinal and other soft tissues: Negative. Disc levels: L1-L2: RIGHT paracentral protrusion. RIGHT L2 nerve root impingement is possible. L2-L3:  Small annular rent in the midline.  No impingement. L3-L4:  Normal disc space.  Mild facet arthropathy.  No impingement. L4-L5:  Normal disc space.  Mild facet arthropathy.  No impingement. L5-S1:  Normal disc space.  Mild facet arthropathy.  No impingement. IMPRESSION: MR THORACIC SPINE IMPRESSION LEFT paracentral disc extrusion in the thoracic spine at T8-9. Mild mass effect on the ventral subarachnoid space and slight cord displacement, without significant spinal stenosis or foraminal narrowing. No thoracic compression fracture or worrisome osseous lesion. MR LUMBAR SPINE IMPRESSION RIGHT paracentral protrusion at L1-2. This appears similar to prior MR from 2007. No lumbar compression fracture or worrisome osseous lesion. Electronically Signed   By: Staci Righter M.D.   On: 04/21/2016 13:49   Mr Lumbar Spine Wo Contrast  Result Date: 04/21/2016 CLINICAL DATA:  Back pain for one year.  Acute worsening yesterday. EXAM: MRI THORACIC AND LUMBAR SPINE WITHOUT CONTRAST TECHNIQUE: Multiplanar and multiecho pulse sequences of the thoracic and lumbar spine were obtained without intravenous contrast. COMPARISON:  MRI lumbar spine 05/28/2005 FINDINGS: MRI THORACIC SPINE FINDINGS Alignment:  Physiologic. Vertebrae: No fracture, evidence of discitis, or bone lesion. Cord:  Normal signal and morphology. Paraspinal and other soft tissues: Negative. Disc levels: The individual disc spaces are examined as follows: T1-2:  Normal. T2-3:  Annular bulge.  No impingement. T3-4:  Annular bulge.  No impingement. T4-5:  Annular bulge.  No impingement. T5-6:  Tiny protrusion in the midline.  No impingement. T6-7:  Tiny protrusion in the midline.  No impingement.  T7-8:  LEFT paracentral protrusion.  No impingement. T8-9: LEFT paracentral extrusion. Effacement anterior subarachnoid space. Slight cord displacement. No definite foraminal narrowing. T9-10:  Normal. T10-11:  Mild bulge.  No impingement. T11-12:  Normal. T12-L1:  Normal. MRI LUMBAR SPINE FINDINGS Segmentation:  Normal. Alignment:  Physiologic. Vertebrae:  No fracture, evidence of discitis, or bone lesion. Conus medullaris: Extends to the L1-L2 level and appears normal. Paraspinal and other soft tissues: Negative. Disc levels: L1-L2: RIGHT paracentral protrusion. RIGHT L2 nerve root impingement is possible. L2-L3:  Small annular rent in the midline.  No impingement. L3-L4:  Normal disc space.  Mild facet arthropathy.  No impingement. L4-L5:  Normal disc space.  Mild facet  arthropathy.  No impingement. L5-S1:  Normal disc space.  Mild facet arthropathy.  No impingement. IMPRESSION: MR THORACIC SPINE IMPRESSION LEFT paracentral disc extrusion in the thoracic spine at T8-9. Mild mass effect on the ventral subarachnoid space and slight cord displacement, without significant spinal stenosis or foraminal narrowing. No thoracic compression fracture or worrisome osseous lesion. MR LUMBAR SPINE IMPRESSION RIGHT paracentral protrusion at L1-2. This appears similar to prior MR from 2007. No lumbar compression fracture or worrisome osseous lesion. Electronically Signed   By: Staci Righter M.D.   On: 04/21/2016 13:49    Procedures Procedures (including critical care time)  Medications Ordered in ED Medications  HYDROmorphone (DILAUDID) injection 1 mg (not administered)     Initial Impression / Assessment and Plan / ED Course  I have reviewed the triage vital signs and the nursing notes.  Pertinent labs & imaging results that were available during my care of the patient were reviewed by me and considered in my medical decision making (see chart for details).    Pt in ED with worsening back pain radiating into  left leg onset yesterday. Sent here from orthopedics office to ro cauda equina. WIll get MR lumbar and thoracic spine. Will get CXR for pleuritic symptoms.    cxr negative. MRI as described above. No caudea equina but will need follow up with a neurosurgeon. Pt asked for referral in [place], will give physician on call. Will start on prednisone course. norco for pain. Follow up outpatient.  Return precautions discussed.   Vitals:   04/21/16 1038 04/21/16 1430  BP: 121/77 (!) 110/56  Pulse: 78 62  Resp: (!) 22 18  Temp: 98.1 F (36.7 C)   TempSrc: Oral   SpO2: 100% 99%    Final Clinical Impressions(s) / ED Diagnoses   Final diagnoses:  Back pain  Thoracic radiculopathy    New Prescriptions Discharge Medication List as of 04/21/2016  2:53 PM    START taking these medications   Details  HYDROcodone-acetaminophen (NORCO) 5-325 MG tablet Take 1 tablet by mouth every 6 (six) hours as needed for moderate pain., Starting Sun 04/21/2016, Print    predniSONE (STERAPRED UNI-PAK 21 TAB) 10 MG (21) TBPK tablet Take by mouth daily. Take 6 tabs by mouth daily  for 2 days, then 5 tabs for 2 days, then 4 tabs for 2 days, then 3 tabs for 2 days, 2 tabs for 2 days, then 1 tab by mouth daily for 2 days, Starting Sun 04/21/2016, Print         Jeannett Senior, PA-C 04/21/16 [place], MD 04/21/16 1747

## 2016-04-21 NOTE — ED Triage Notes (Signed)
Has had back pain for a year and yesterday she was in shower and heard a crack  Now having numbness in leg and groin  ? Incontinence, and she saw dr today and  Sent here for further tests

## 2016-04-21 NOTE — Discharge Instructions (Signed)
Continue ibuprofen or Tylenol for pain. Take Norco for severe pain only. Take prednisone as prescribed until all gone. Please follow-up with neurosurgery for further evaluation.

## 2016-04-21 NOTE — ED Notes (Signed)
Pt in Radiology 

## 2016-04-25 ENCOUNTER — Ambulatory Visit (INDEPENDENT_AMBULATORY_CARE_PROVIDER_SITE_OTHER): Payer: 59 | Admitting: Family Medicine

## 2016-04-25 ENCOUNTER — Encounter: Payer: Self-pay | Admitting: Family Medicine

## 2016-04-25 ENCOUNTER — Encounter: Payer: Self-pay | Admitting: Gastroenterology

## 2016-04-25 VITALS — BP 104/64 | HR 85 | Temp 98.5°F | Wt 221.2 lb

## 2016-04-25 DIAGNOSIS — M7660 Achilles tendinitis, unspecified leg: Secondary | ICD-10-CM | POA: Diagnosis not present

## 2016-04-25 DIAGNOSIS — M5417 Radiculopathy, lumbosacral region: Secondary | ICD-10-CM

## 2016-04-25 DIAGNOSIS — G43719 Chronic migraine without aura, intractable, without status migrainosus: Secondary | ICD-10-CM | POA: Diagnosis not present

## 2016-04-25 DIAGNOSIS — K921 Melena: Secondary | ICD-10-CM

## 2016-04-25 DIAGNOSIS — M5416 Radiculopathy, lumbar region: Secondary | ICD-10-CM

## 2016-04-25 NOTE — Progress Notes (Signed)
Headaches are better, botox seems to be helping a lot.  D/w pt.    She had been having some back pain at baseline but then had dramatic inc in pain over the weekend.  She went to [facility] walk in clinic and was sent to ER re: concern for dec in strength in the L leg and possible urinary incontinence but she has h/o bladder sling in the past and the urinary sx may not be related to her back.    H/o back surgery noted.    MR at ER with: LEFT paracentral disc extrusion in the thoracic spine at T8-9. Mild mass effect on the ventral subarachnoid space and slight cord displacement, without significant spinal stenosis or foraminal narrowing. No thoracic compression fracture or worrisome osseous lesion.  MR LUMBAR SPINE IMPRESSION  RIGHT paracentral protrusion at L1-2. This appears similar to prior MR from 2007. No lumbar compression fracture or worrisome osseous lesion.  Sent home from ER with hydrocodone and pred taper.    She has intermittent h/o blood in her stool.  She has no known h/o hemorrhoids.  Sx going on for months.  This was first I had heard of this today.  No rectal pain.    She is still limping from L heel pain and "I walk like my grandma."  She still has pain radiating down the L leg.    She has an appointment with Dr. Patrice Paradise with the spine clinic.    She isn't able to work due to pain, occ requiring doses of hydrocodone to get pain under control but she is trying to limit its use.    More ankle pain walking barefoot, at the L achilles.    PMH and SH reviewed  ROS: Per HPI unless specifically indicated in ROS section   Meds, vitals, and allergies reviewed.   nad ncat Neck supple, no LA rrr ctab Mid back ttp at the bra line, no rash, no bruising abd soft Ext w/o edema but L achilles ttp.  Rectal exam deferred as it wouldn't change mgmt- would refer to GI in any event.  D/w pt.

## 2016-04-25 NOTE — Patient Instructions (Addendum)
I'll await the FMLA forms and I'll work on them.  I'll await the spine clinic notes.  Rosaria Ferries will call about your referral.  Update me as needed.   Get a pair of heel wedges and put them in your shoes. Take care.  Glad to see you.

## 2016-04-25 NOTE — Progress Notes (Signed)
Pre visit review using our clinic review tool, if applicable. No additional management support is needed unless otherwise documented below in the visit note. 

## 2016-04-28 ENCOUNTER — Encounter: Payer: Self-pay | Admitting: Family Medicine

## 2016-04-28 DIAGNOSIS — M7660 Achilles tendinitis, unspecified leg: Secondary | ICD-10-CM | POA: Insufficient documentation

## 2016-04-28 DIAGNOSIS — K921 Melena: Secondary | ICD-10-CM | POA: Insufficient documentation

## 2016-04-28 NOTE — Assessment & Plan Note (Signed)
She still has pain radiating down the L leg.  I can do FMLA papers for patient if needed.  D/w pt.  Would continue current meds as is for now.  I'll await the spine clinic notes.

## 2016-04-28 NOTE — Assessment & Plan Note (Signed)
Improved per patient report.

## 2016-04-28 NOTE — Assessment & Plan Note (Signed)
Advised pt to get a pair of heel wedges and put them in her shoes. Anatomy d/w pt.

## 2016-04-28 NOTE — Assessment & Plan Note (Signed)
Refer, d/w pt.  >25 minutes spent in face to face time with patient, >50% spent in counselling or coordination of care.

## 2016-04-30 ENCOUNTER — Telehealth: Payer: Self-pay

## 2016-04-30 ENCOUNTER — Telehealth: Payer: Self-pay | Admitting: Family Medicine

## 2016-04-30 ENCOUNTER — Other Ambulatory Visit: Payer: Self-pay | Admitting: Family Medicine

## 2016-04-30 MED ORDER — HYDROCODONE-ACETAMINOPHEN 5-325 MG PO TABS: 1.0000 | ORAL_TABLET | Freq: Four times a day (QID) | ORAL | 0 refills | Status: DC | PRN

## 2016-04-30 NOTE — Telephone Encounter (Signed)
Printed.  I'll await the spine clinic notes.  Thanks.

## 2016-04-30 NOTE — Telephone Encounter (Signed)
Pt last seen 04/25/16 with back pain. Pain continues and hydrocodone apap helps very little; pain level now is 6. Pt has 2 hydrocodone apap left. Pt request cb when rx ready for pick up. [facility] pharmacy.Please advise. Pt does not have f/u appt scheduled.

## 2016-04-30 NOTE — Telephone Encounter (Signed)
fmla papework in Dr Josefine Class IN BOX for Review and Signature

## 2016-04-30 NOTE — Telephone Encounter (Signed)
Patient notified by telephone that script is up front ready for pickup. 

## 2016-05-01 NOTE — Telephone Encounter (Addendum)
Form done. Thanks. Please scan and send.  

## 2016-05-01 NOTE — Telephone Encounter (Signed)
Paperwork faxed °Pt aware °Copy for file  °Copy for scan °Copy for pt °

## 2016-05-06 ENCOUNTER — Other Ambulatory Visit: Payer: Self-pay | Admitting: Neurology

## 2016-05-06 ENCOUNTER — Telehealth: Payer: Self-pay | Admitting: Neurology

## 2016-05-06 NOTE — Telephone Encounter (Signed)
When I last saw her for Botox, I did prescribe her gabapentin 300mg  at bedtime for restless leg.  We can prescribe her 300mg  at bedtime (unless there was another reason it was discontinued by her PCP, such as side effects).

## 2016-05-06 NOTE — Telephone Encounter (Signed)
Received a call from PT regarding medication: Gabapentin.  Patient needs a refill of medication. Yes  Patient having side effects from medication. No  Patient calling to update [place] on medication. No

## 2016-05-06 NOTE — Telephone Encounter (Signed)
[place] patient made aware sent Gabapentin 300mg  capsule to pharm on file; take 1 cap @ hs. Patient verbalized understanding.

## 2016-05-06 NOTE — Telephone Encounter (Signed)
Spoke to patient earlier. Refilled med as requested.

## 2016-05-08 ENCOUNTER — Ambulatory Visit (INDEPENDENT_AMBULATORY_CARE_PROVIDER_SITE_OTHER): Payer: 59 | Admitting: Gastroenterology

## 2016-05-08 ENCOUNTER — Encounter: Payer: Self-pay | Admitting: Gastroenterology

## 2016-05-08 VITALS — BP 116/70 | HR 82 | Ht 64.0 in | Wt 222.0 lb

## 2016-05-08 DIAGNOSIS — K625 Hemorrhage of anus and rectum: Secondary | ICD-10-CM | POA: Diagnosis not present

## 2016-05-08 DIAGNOSIS — Z1211 Encounter for screening for malignant neoplasm of colon: Secondary | ICD-10-CM | POA: Diagnosis not present

## 2016-05-08 MED ORDER — NA SULFATE-K SULFATE-MG SULF 17.5-3.13-1.6 GM/177ML PO SOLN: ORAL | 0 refills | Status: DC

## 2016-05-08 NOTE — Patient Instructions (Addendum)
You have been scheduled for a colonoscopy. Please follow written instructions given to you at your visit today.  Please pick up your prep supplies at the pharmacy within the next 1-3 days. If you use inhalers (even only as needed), please bring them with you on the day of your procedure.   Normal BMI (Body Mass Index- based on height and weight) is between 19 and 25. Your Body mass index is 38.11 kg/m.Please consider follow up  regarding your BMI with your Primary Care Provider.  Thank You

## 2016-05-08 NOTE — Progress Notes (Addendum)
05/08/2016 Bridget Beck [id number] 09-Mar-1970   HISTORY OF PRESENT ILLNESS:  This is a pleasant 46 year old female who is new to our practice and was referred here by her PCP, Dr. Damita Dunnings, for complaints of rectal bleeding. She tells me that she has seen some blood on and off for about the past year or so. She says that she's never really thought much of it. She says that sometimes it is bright red blood and sometimes a little bit darker in color. Sometimes she sees blood independent of a bowel movement. She says that she is always had "stomach problems" and describes some constipation on and off. She says that the constipation has been an issue for several years and would not describe it as severe. She currently is only taking a probiotic to help her move her bowels, but says that it does not help much. She says that sometimes if she is a little constipated then her stool will be a darker color. She complains of intermittent, random abdominal pain/cramping in her lower abdomen about once a week that resolves on its own after about 30 minutes to an hour.  She is not a great historian/has difficult describing her symptoms.  Never saw GI or had colonoscopy in the past.   Past Medical History:  Diagnosis Date  . Depression   . Migraines    with aura   Past Surgical History:  Procedure Laterality Date  . CERVICAL FUSION     x 2  . INCONTINENCE SURGERY  2004    reports that she quit smoking about 3 years ago. Her smoking use included Cigarettes. She has a 0.30 pack-year smoking history. She quit smokeless tobacco use about 3 years ago. She reports that she does not drink alcohol or use drugs. family history includes Cancer in her maternal aunt; Diabetes in her mother; Heart disease in her maternal grandmother; Peripheral vascular disease in her father; Stroke in her sister. Allergies  Allergen Reactions  . Amitriptyline Other (See Comments)    Had severe migraine and pt could not see for  2 hours.  [name] [Pregabalin]     Migraine  . Methocarbamol Other (See Comments)    Stomach upset  . Prozac [Fluoxetine Hcl]     Fatigue   . Remeron [Mirtazapine]     "felt like a zombie"      Outpatient Encounter Prescriptions as of 05/08/2016  Medication Sig  . gabapentin (NEURONTIN) 300 MG capsule Take 1 capsule (300 mg total) by mouth at bedtime.  Marland Kitchen HYDROcodone-acetaminophen (NORCO) 5-325 MG tablet Take 1 tablet by mouth every 6 (six) hours as needed for moderate pain.  . promethazine (PHENERGAN) 25 MG tablet Take 1 tablet (25 mg total) by mouth every 6 (six) hours as needed for nausea.  . SUMAtriptan (IMITREX) 100 MG tablet Take 1 tablet (100 mg total) by mouth as directed. May repeat in 2 hours if headache persists or recurs.  . SUMAtriptan Succinate 6 MG/0.5ML SOCT Inject 6 Mg into skin at earliest onset of migraine, may repeat in 1 hour if headache persists, or reoccurs  . topiramate (TOPAMAX) 25 MG tablet Take 3 tablets (75 mg total) by mouth at bedtime.  . valACYclovir (VALTREX) 1000 MG tablet Take 2 tablets (2,000 mg total) by mouth 2 (two) times daily. For 1 day.  Repeat as needed with future episodes.  . [DISCONTINUED] predniSONE (STERAPRED UNI-PAK 21 TAB) 10 MG (21) TBPK tablet Take by mouth daily. Take 6  tabs by mouth daily  for 2 days, then 5 tabs for 2 days, then 4 tabs for 2 days, then 3 tabs for 2 days, 2 tabs for 2 days, then 1 tab by mouth daily for 2 days   No facility-administered encounter medications on file as of 05/08/2016.      REVIEW OF SYSTEMS  : All other systems reviewed and negative except where noted in the History of Present Illness.   PHYSICAL EXAM: BP 116/70   Pulse 82   Ht 5\' 4"  (1.626 m)   Wt 222 lb (100.7 kg)   BMI 38.11 kg/m  General: Well developed white female in no acute distress Head: Normocephalic and atraumatic Eyes:  Sclerae anicteric, conjunctiva pink. Ears: Normal auditory acuity Lungs: Clear throughout to  auscultation Heart: Regular rate and rhythm Abdomen: Soft, non-distended.  Normal bowel sounds.  Mild lower abdominal TTP. Rectal:  Will be done at the time of colonoscopy. Musculoskeletal: Symmetrical with no gross deformities  Skin: No lesions on visible extremities Extremities: No edema  Neurological: Alert oriented x 4, grossly non-focal Psychological:  Alert and cooperative. Normal mood and affect  ASSESSMENT AND PLAN: -Rectal bleeding:  Intermittent for at least the past year.  Usually bright red blood but sometimes a little darker in color.  Sometimes blood independent of BM/stool.  Will schedule colonoscopy for evaluation.  May very well be hemorrhoidal bleeding.  The risks, benefits, and alternatives to colonoscopy were discussed with the patient and she consents to proceed.  I have advised her to try a daily powder fiber supplement such as Benefiber or Citrucel to help her move her bowels.   CC:  Tonia Ghent, MD   Addendum: Reviewed and agree with initial management. Jerene Bears, MD

## 2016-05-13 ENCOUNTER — Encounter: Payer: Self-pay | Admitting: Internal Medicine

## 2016-05-13 ENCOUNTER — Ambulatory Visit (AMBULATORY_SURGERY_CENTER): Payer: 59 | Admitting: Internal Medicine

## 2016-05-13 VITALS — BP 117/60 | HR 62 | Temp 98.4°F | Resp 13 | Ht 64.0 in | Wt 222.0 lb

## 2016-05-13 DIAGNOSIS — K625 Hemorrhage of anus and rectum: Secondary | ICD-10-CM | POA: Diagnosis present

## 2016-05-13 DIAGNOSIS — Z1211 Encounter for screening for malignant neoplasm of colon: Secondary | ICD-10-CM | POA: Diagnosis not present

## 2016-05-13 DIAGNOSIS — D123 Benign neoplasm of transverse colon: Secondary | ICD-10-CM

## 2016-05-13 MED ORDER — SODIUM CHLORIDE 0.9 % IV SOLN: 500.0000 mL | INTRAVENOUS | Status: DC

## 2016-05-13 NOTE — Progress Notes (Signed)
Report given to PACU, vss 

## 2016-05-13 NOTE — Progress Notes (Signed)
Called to room to assist during endoscopic procedure.  Patient ID and intended procedure confirmed with present staff. Received instructions for my participation in the procedure from the performing physician.  

## 2016-05-13 NOTE — Patient Instructions (Addendum)
YOU HAD AN ENDOSCOPIC PROCEDURE TODAY AT [facility]:   Refer to the procedure report that was given to you for any specific questions about what was found during the examination.  If the procedure report does not answer your questions, please call your gastroenterologist to clarify.  If you requested that your care partner not be given the details of your procedure findings, then the procedure report has been included in a sealed envelope for you to review at your convenience later.  YOU SHOULD EXPECT: Some feelings of bloating in the abdomen. Passage of more gas than usual.  Walking can help get rid of the air that was put into your GI tract during the procedure and reduce the bloating. If you had a lower endoscopy (such as a colonoscopy or flexible sigmoidoscopy) you may notice spotting of blood in your stool or on the toilet paper. If you underwent a bowel prep for your procedure, you may not have a normal bowel movement for a few days.  Please Note:  You might notice some irritation and congestion in your nose or some drainage.  This is from the oxygen used during your procedure.  There is no need for concern and it should clear up in a day or so.  SYMPTOMS TO REPORT IMMEDIATELY:   Following lower endoscopy (colonoscopy or flexible sigmoidoscopy):  Excessive amounts of blood in the stool  Significant tenderness or worsening of abdominal pains  Swelling of the abdomen that is new, acute  Fever of 100F or higher    For urgent or emergent issues, a gastroenterologist can be reached at any hour by calling [phone].   DIET:  We do recommend a small meal at first, but then you may proceed to your regular diet.  Drink plenty of fluids but you should avoid alcoholic beverages for 24 hours.  ACTIVITY:  You should plan to take it easy for the rest of today and you should NOT DRIVE or use heavy machinery until tomorrow (because of the sedation medicines used during the test).     FOLLOW UP: Our staff will call the number listed on your records the next business day following your procedure to check on you and address any questions or concerns that you may have regarding the information given to you following your procedure. If we do not reach you, we will leave a message.  However, if you are feeling well and you are not experiencing any problems, there is no need to return our call.  We will assume that you have returned to your regular daily activities without incident.  If any biopsies were taken you will be contacted by phone or by letter within the next 1-3 weeks.  Please call us at [phone] if you have not heard about the biopsies in 3 weeks.    SIGNATURES/CONFIDENTIALITY: You and/or your care partner have signed paperwork which will be entered into your electronic medical record.  These signatures attest to the fact that that the information above on your After Visit Summary has been reviewed and is understood.  Full responsibility of the confidentiality of this discharge information lies with you and/or your care-partner.    INFORMATION ON POLYPS,DIVERTICULOSIS,AND HEMORRHOIDS GIVEN TO YOU TODAY  AWAIT PATHOLOGY RESULTS ON POLYPS  TRY MIRALAX ONE CAPFUL EVERYDAY / IF THIS DOESN'T HELP MAKE APPOINTMENT TO TALK WITH DR [name] ABOUT IRREGULARITY.

## 2016-05-13 NOTE — Op Note (Signed)
[facility] Patient Name: Bridget Beck Procedure Date: 05/13/2016 3:15 PM MRN: [id number] Endoscopist: Jerene Bears , MD Age: 46 Referring MD:  Date of Birth: January 23, 1971 Gender: Female Account #: [id number] Procedure:                Colonoscopy Indications:              Rectal bleeding; 1st colonoscopy Medicines:                Monitored Anesthesia Care Procedure:                Pre-Anesthesia Assessment:                           - Prior to the procedure, a History and Physical                            was performed, and patient medications and                            allergies were reviewed. The patient's tolerance of                            previous anesthesia was also reviewed. The risks                            and benefits of the procedure and the sedation                            options and risks were discussed with the patient.                            All questions were answered, and informed consent                            was obtained. Prior Anticoagulants: The patient has                            taken no previous anticoagulant or antiplatelet                            agents. ASA Grade Assessment: II - A patient with                            mild systemic disease. After reviewing the risks                            and benefits, the patient was deemed in                            satisfactory condition to undergo the procedure.                           After obtaining informed consent, the colonoscope  was passed under direct vision. Throughout the                            procedure, the patient's blood pressure, pulse, and                            oxygen saturations were monitored continuously. The                            Colonoscope was introduced through the anus and                            advanced to the the terminal ileum. The colonoscopy                            was performed without  difficulty. The patient                            tolerated the procedure well. The quality of the                            bowel preparation was good. The terminal ileum,                            ileocecal valve, appendiceal orifice, and rectum                            were photographed. Scope In: 3:26:28 PM Scope Out: 3:43:00 PM Scope Withdrawal Time: 0 hours 13 minutes 4 seconds  Total Procedure Duration: 0 hours 16 minutes 32 seconds  Findings:                 The digital rectal exam was normal.                           The terminal ileum appeared normal.                           Two sessile polyps were found in the transverse                            colon. The polyps were 4 to 5 mm in size. These                            polyps were removed with a cold snare. Resection                            and retrieval were complete.                           Multiple small-mouthed diverticula were found in                            the sigmoid colon.  Internal hemorrhoids were found during                            retroflexion. The hemorrhoids were small. Complications:            No immediate complications. Estimated Blood Loss:     Estimated blood loss was minimal. Impression:               - The examined portion of the ileum was normal.                           - Two 4 to 5 mm polyps in the transverse colon,                            removed with a cold snare. Resected and retrieved.                           - Mild diverticulosis in the sigmoid colon.                           - Internal hemorrhoids. Recommendation:           - Patient has a contact number available for                            emergencies. The signs and symptoms of potential                            delayed complications were discussed with the                            patient. Return to normal activities tomorrow.                            Written discharge  instructions were provided to the                            patient.                           - Resume previous diet.                           - Continue present medications.                           - Await pathology results.                           - Repeat colonoscopy is recommended for                            surveillance. The colonoscopy date will be                            determined after pathology results from today's  exam become available for review. Jerene Bears, MD 05/13/2016 3:47:11 PM This report has been signed electronically.

## 2016-05-14 ENCOUNTER — Telehealth: Payer: Self-pay | Admitting: *Deleted

## 2016-05-14 NOTE — Telephone Encounter (Signed)
No answer, left message to call if questions or concerns. 

## 2016-05-15 ENCOUNTER — Telehealth: Payer: Self-pay | Admitting: *Deleted

## 2016-05-15 NOTE — Telephone Encounter (Signed)
  Follow up Call-  Call back number 05/13/2016  Post procedure Call Back phone  # [phone]  Permission to leave phone message Yes  Some recent data might be hidden     Patient questions:  Do you have a fever, pain , or abdominal swelling? No. Pain Score  0 *  Have you tolerated food without any problems? Yes.    Have you been able to return to your normal activities? Yes.    Do you have any questions about your discharge instructions: Diet   No. Medications  No. Follow up visit  No.  Do you have questions or concerns about your Care? yes  Actions: * If pain score is 4 or above: No action needed, pain <4.  Pt is still having some bloating.  Gave her some suggestions on what foods to avoid and suggested that she try some over the counter gas medication.  She will call back if symptoms do not improve.

## 2016-05-20 ENCOUNTER — Encounter: Payer: Self-pay | Admitting: Internal Medicine

## 2016-05-22 DIAGNOSIS — M549 Dorsalgia, unspecified: Secondary | ICD-10-CM | POA: Diagnosis not present

## 2016-05-22 DIAGNOSIS — M545 Low back pain: Secondary | ICD-10-CM | POA: Diagnosis not present

## 2016-06-05 DIAGNOSIS — M5184 Other intervertebral disc disorders, thoracic region: Secondary | ICD-10-CM | POA: Diagnosis not present

## 2016-06-05 DIAGNOSIS — M79672 Pain in left foot: Secondary | ICD-10-CM | POA: Diagnosis not present

## 2016-06-05 DIAGNOSIS — M545 Low back pain: Secondary | ICD-10-CM | POA: Diagnosis not present

## 2016-06-05 DIAGNOSIS — M546 Pain in thoracic spine: Secondary | ICD-10-CM | POA: Diagnosis not present

## 2016-06-07 ENCOUNTER — Other Ambulatory Visit (HOSPITAL_COMMUNITY): Payer: Self-pay | Admitting: Orthopaedic Surgery

## 2016-06-07 DIAGNOSIS — M79672 Pain in left foot: Secondary | ICD-10-CM

## 2016-06-14 ENCOUNTER — Ambulatory Visit (HOSPITAL_COMMUNITY)
Admission: RE | Admit: 2016-06-14 | Discharge: 2016-06-14 | Disposition: A | Discharge status: Home / Self Care | Payer: 59 | Source: Ambulatory Visit | Attending: Orthopaedic Surgery | Admitting: Orthopaedic Surgery

## 2016-06-14 DIAGNOSIS — R600 Localized edema: Secondary | ICD-10-CM | POA: Diagnosis not present

## 2016-06-14 DIAGNOSIS — M79672 Pain in left foot: Secondary | ICD-10-CM | POA: Diagnosis not present

## 2016-06-14 DIAGNOSIS — R937 Abnormal findings on diagnostic imaging of other parts of musculoskeletal system: Secondary | ICD-10-CM | POA: Diagnosis not present

## 2016-06-20 DIAGNOSIS — M545 Low back pain: Secondary | ICD-10-CM | POA: Diagnosis not present

## 2016-06-20 DIAGNOSIS — M5184 Other intervertebral disc disorders, thoracic region: Secondary | ICD-10-CM | POA: Diagnosis not present

## 2016-06-20 DIAGNOSIS — M546 Pain in thoracic spine: Secondary | ICD-10-CM | POA: Diagnosis not present

## 2016-06-20 DIAGNOSIS — M79672 Pain in left foot: Secondary | ICD-10-CM | POA: Diagnosis not present

## 2016-06-26 ENCOUNTER — Telehealth: Payer: Self-pay | Admitting: Family Medicine

## 2016-06-26 DIAGNOSIS — M5184 Other intervertebral disc disorders, thoracic region: Secondary | ICD-10-CM | POA: Diagnosis not present

## 2016-06-26 NOTE — Telephone Encounter (Signed)
[employer] faxed attending provider statement In Dr Josefine Class in box for review and signature

## 2016-06-26 NOTE — Telephone Encounter (Signed)
I'll work on the hard copy next week.  Thanks.

## 2016-07-01 NOTE — Telephone Encounter (Signed)
I was out of the office all of last week.  I just got back today.  I don't know when this will be done.  We'll let them know when done.

## 2016-07-01 NOTE — Telephone Encounter (Signed)
Bridget Beck with [employer] left a voicemail stating that they had faxed over a form and needs it faxed back along with requested information.

## 2016-07-02 NOTE — Telephone Encounter (Signed)
Paperwork faxed °Pt aware °Copy for file  °Copy for scan °Copy for pt °

## 2016-07-02 NOTE — Telephone Encounter (Signed)
Form done. Thanks.  Please scan and send requested info.

## 2016-07-12 ENCOUNTER — Ambulatory Visit (INDEPENDENT_AMBULATORY_CARE_PROVIDER_SITE_OTHER): Payer: 59 | Admitting: Neurology

## 2016-07-12 DIAGNOSIS — G43709 Chronic migraine without aura, not intractable, without status migrainosus: Secondary | ICD-10-CM | POA: Diagnosis not present

## 2016-07-12 DIAGNOSIS — IMO0002 Reserved for concepts with insufficient information to code with codable children: Secondary | ICD-10-CM

## 2016-07-12 MED ORDER — ONABOTULINUMTOXINA 100 UNITS IJ SOLR
155.0000 [IU] | Freq: Once | INTRAMUSCULAR | Status: AC
  Administered 2016-07-12: 155 [IU] via INTRAMUSCULAR

## 2016-07-12 NOTE — Progress Notes (Signed)
[facility]   Procedure Note Botox  Attending: Dr. Tomi Likens  Preoperative Diagnosis(es): Chronic migraine  Consent obtained from: The patient Benefits discussed included, but were not limited to decreased muscle tightness, increased joint range of motion, and decreased pain.  Risk discussed included, but were not limited pain and discomfort, bleeding, bruising, excessive weakness, venous thrombosis, muscle atrophy and dysphagia.  Anticipated outcomes of the procedure as well as he risks and benefits of the alternatives to the procedure, and the roles and tasks of the personnel to be involved, were discussed with the patient, and the patient consents to the procedure and agrees to proceed. A copy of the patient medication guide was given to the patient which explains the blackbox warning.  Patients identity and treatment sites confirmed Yes.  .  Details of Procedure: Skin was cleaned with alcohol. Prior to injection, the needle plunger was aspirated to make sure the needle was not within a blood vessel.  There was no blood retrieved on aspiration.    Following is a summary of the muscles injected  And the amount of Botulinum toxin used:  Dilution 200 units of Botox was reconstituted with 4 ml of preservative free normal saline. Time of reconstitution: At the time of the office visit (<30 minutes prior to injection)   Injections  155 total units of Botox was injected with a 30 gauge needle.  Injection Sites: L occipitalis: 15 units- 3 sites  R occiptalis: 15 units- 3 sites  L upper trapezius: 15 units- 3 sites R upper trapezius: 15 units- 3 sits          L paraspinal: 10 units- 2 sites R paraspinal: 10 units- 2 sites  Face L frontalis(2 injection sites):10 units   R frontalis(2 injection sites):10 units         L corrugator: 5 units   R corrugator: 5 units           Procerus: 5 units   L temporalis: 20 units R temporalis: 20 units   Agent:  200 units of botulinum Type A  (Onobotulinum Toxin type A) was reconstituted with 4 ml of preservative free normal saline.  Time of reconstitution: At the time of the office visit (<30 minutes prior to injection)     Total injected (Units): 155  Total wasted (Units): 0  Patient tolerated procedure well without complications.   Reinjection is anticipated in 3 months. Return to clinic next month.  Metta Clines, DO

## 2016-07-19 DIAGNOSIS — M545 Low back pain: Secondary | ICD-10-CM | POA: Diagnosis not present

## 2016-07-19 DIAGNOSIS — M5184 Other intervertebral disc disorders, thoracic region: Secondary | ICD-10-CM | POA: Diagnosis not present

## 2016-07-19 DIAGNOSIS — M79672 Pain in left foot: Secondary | ICD-10-CM | POA: Diagnosis not present

## 2016-07-19 DIAGNOSIS — M84375A Stress fracture, left foot, initial encounter for fracture: Secondary | ICD-10-CM | POA: Diagnosis not present

## 2016-07-19 DIAGNOSIS — M791 Myalgia: Secondary | ICD-10-CM | POA: Diagnosis not present

## 2016-08-06 ENCOUNTER — Ambulatory Visit (INDEPENDENT_AMBULATORY_CARE_PROVIDER_SITE_OTHER): Payer: 59 | Admitting: Neurology

## 2016-08-06 ENCOUNTER — Encounter: Payer: Self-pay | Admitting: Neurology

## 2016-08-06 VITALS — BP 106/70 | HR 63 | Ht 64.0 in

## 2016-08-06 DIAGNOSIS — G43009 Migraine without aura, not intractable, without status migrainosus: Secondary | ICD-10-CM

## 2016-08-06 MED ORDER — NAPROXEN 500 MG PO TABS: ORAL_TABLET | ORAL | 2 refills | Status: DC

## 2016-08-06 NOTE — Progress Notes (Signed)
NEUROLOGY FOLLOW UP OFFICE NOTE  Londa Mackowski [place] [id number]  HISTORY OF PRESENT ILLNESS: Bridget Beck is a 46 year old right-handed woman with depression who follows up for migraine and restless leg.   UPDATE: Intensity:  6/10 Duration:  30-45 minutes but lingers for a couple of days Frequency:  Once a month (triggered by change in weather usually) Current NSAIDS:  no Current analgesics:  no Current triptans:  sumatriptan 100mg  Current anti-emetic:  promethazine 25mg  Current muscle relaxants:  no Current anti-anxiolytic:  no Current sleep aide:  no Current Antihypertensive medications:  no Current Antidepressant medications:  no Current Anticonvulsant medications:  topiramate 75mg , gabapentin (for CRPS/back pain) Current Vitamins/Herbal/Supplements:  no Current Antihistamines/Decongestants:  no Other therapy:  Botox (6 rounds) Other medication:  Requip    HISTORY: Onset:  Since childhood. Location:  Usually left sided (rarely right-sided or holocephalic) Quality:  pounding Initial intensity:  10/10; January:  6/10 Aura:  no Associated symptoms:  Nausea, photophobia, phonophobia, osmophobia, difficulty seeing (cannot elaborate), talks jibberish, sometimes vomiting Initial duration:  Several hours but dull residual headache for a day or two afterwards; January: 30-45 minutes Initial frequency:  25 days per month Triggers/exacerbating factors:  Peanuts, chocolate, smoked foods, alcohol, change in weather, menstrual cycle Relieving factors:  none Activity:  Needs to lay down in dark quite room   Past abortive therapy:  [name], sumatriptan 4mg  [place], ibuprofen (takes edge off), [name] Past preventative therapy:  Depakote (side effects), propranolol (unsure of dose.  ineffective), amitriptyline 25mg  (triggered headache), zonisamide 600mg  (stopped being effective)   Regarding restless leg:  Mirapex caused headaches.  She was started on  ropinirole 0.25mg .  This has been effective.  PAST MEDICAL HISTORY: Past Medical History:  Diagnosis Date  . Depression   . GERD (gastroesophageal reflux disease)   . Migraines    with aura    MEDICATIONS: Current Outpatient Prescriptions on File Prior to Visit  Medication Sig Dispense Refill  . gabapentin (NEURONTIN) 300 MG capsule Take 1 capsule (300 mg total) by mouth at bedtime. (Patient taking differently: Take by mouth. 1 in the morning, 1 in the afternoon, 2 in the evening) 90 capsule 1  . promethazine (PHENERGAN) 25 MG tablet Take 1 tablet (25 mg total) by mouth every 6 (six) hours as needed for nausea. 40 tablet 3  . SUMAtriptan (IMITREX) 100 MG tablet Take 1 tablet (100 mg total) by mouth as directed. May repeat in 2 hours if headache persists or recurs. 10 tablet 5  . SUMAtriptan Succinate 6 MG/0.5ML SOCT Inject 6 Mg into skin at earliest onset of migraine, may repeat in 1 hour if headache persists, or reoccurs 6 mL 5  . topiramate (TOPAMAX) 25 MG tablet Take 3 tablets (75 mg total) by mouth at bedtime. 270 tablet 3  . valACYclovir (VALTREX) 1000 MG tablet Take 2 tablets (2,000 mg total) by mouth 2 (two) times daily. For 1 day.  Repeat as needed with future episodes. 8 tablet 1   Current Facility-Administered Medications on File Prior to Visit  Medication Dose Route Frequency Provider Last Rate Last Dose  . 0.9 %  sodium chloride infusion  500 mL Intravenous Continuous [name], [name], MD        ALLERGIES: Allergies  Allergen Reactions  . Amitriptyline Other (See Comments)    Had severe migraine and pt could not see for 2 hours.  [name] [Pregabalin]     Migraine  . Methocarbamol Other (  See Comments)    Stomach upset  . Prozac [Fluoxetine Hcl]     Fatigue   . Remeron [Mirtazapine]     "felt like a zombie"    FAMILY HISTORY: Family History  Problem Relation Age of Onset  . Peripheral vascular disease Father   . Bone cancer Father   . Stroke Sister         possible CVA  . Diabetes Mother        type II, diet controlled  . Cancer Maternal Aunt        non hodgkins lymphoma  . Heart disease Maternal Grandmother   . Breast cancer Maternal Aunt     SOCIAL HISTORY: Social History   Social History  . Marital status: Married    Spouse name: N/A  . Number of children: 3  . Years of education: N/A   Occupational History  . [employer]   Social History Main Topics  . Smoking status: Former Smoker    Packs/day: 0.10    Years: 3.00    Types: Cigarettes    Quit date: 10/28/2012  . Smokeless tobacco: Former Systems developer    Quit date: 11/10/2012  . Alcohol use No  . Drug use: No  . Sexual activity: Yes    Partners: Male   Other Topics Concern  . Not on file   Social History Narrative   2 years of college.   Former CMA at [employer]   1st husband died from [employer] in 05/18/10   Remarried    3 kids    REVIEW OF SYSTEMS: Constitutional: No fevers, chills, or sweats, no generalized fatigue, change in appetite Eyes: No visual changes, double vision, eye pain Ear, nose and throat: No hearing loss, ear pain, nasal congestion, sore throat Cardiovascular: No chest pain, palpitations Respiratory:  No shortness of breath at rest or with exertion, wheezes GastrointestinaI: No nausea, vomiting, diarrhea, abdominal pain, fecal incontinence Genitourinary:  No dysuria, urinary retention or frequency Musculoskeletal:  No neck pain, back pain Integumentary: No rash, pruritus, skin lesions Neurological: as above Psychiatric: No depression, insomnia, anxiety Endocrine: No palpitations, fatigue, diaphoresis, mood swings, change in appetite, change in weight, increased thirst Hematologic/Lymphatic:  No purpura, petechiae. Allergic/Immunologic: no itchy/runny eyes, nasal congestion, recent allergic reactions, rashes  PHYSICAL EXAM: Vitals:   08/06/16 0916  BP: 106/70  Pulse: 63   General: No acute distress.  Patient appears well-groomed.    Head:  Normocephalic/atraumatic Eyes:  Fundi examined but not visualized Neck: supple, no paraspinal tenderness, full range of motion Heart:  Regular rate and rhythm Lungs:  Clear to auscultation bilaterally Back: No paraspinal tenderness Neurological Exam: alert and oriented to person, place, and time. Attention span and concentration intact, recent and remote memory intact, fund of knowledge intact.  Speech fluent and not dysarthric, language intact.  CN II-XII intact. Bulk and tone normal, muscle strength 5/5 throughout.  Sensation to light touch, temperature and vibration intact.  Deep tendon reflexes 2+ throughout, toes downgoing.  Finger to nose and heel to shin testing intact.  Wearing left boot so with limp, Romberg negative.  IMPRESSION: Migraine, much improved on Botox (greater than 50% reduction in frequency)  PLAN: 1.  Continue Botox (next round in September) 2.  Topiramate 75mg  at bedtime 3.  For abortive therapy, continue sumatriptan 100mg  but may take with naproxen 500mg    Metta Clines, DO  CC:  Elveria Rising. Damita Dunnings, MD

## 2016-08-22 DIAGNOSIS — M5184 Other intervertebral disc disorders, thoracic region: Secondary | ICD-10-CM | POA: Diagnosis not present

## 2016-08-22 DIAGNOSIS — Z6839 Body mass index (BMI) 39.0-39.9, adult: Secondary | ICD-10-CM | POA: Diagnosis not present

## 2016-08-22 DIAGNOSIS — M5137 Other intervertebral disc degeneration, lumbosacral region: Secondary | ICD-10-CM | POA: Diagnosis not present

## 2016-08-22 DIAGNOSIS — M84375A Stress fracture, left foot, initial encounter for fracture: Secondary | ICD-10-CM | POA: Diagnosis not present

## 2016-08-29 ENCOUNTER — Ambulatory Visit (INDEPENDENT_AMBULATORY_CARE_PROVIDER_SITE_OTHER): Payer: 59 | Admitting: Sports Medicine

## 2016-08-29 DIAGNOSIS — M79672 Pain in left foot: Secondary | ICD-10-CM | POA: Diagnosis not present

## 2016-08-29 DIAGNOSIS — M722 Plantar fascial fibromatosis: Secondary | ICD-10-CM | POA: Diagnosis not present

## 2016-08-29 DIAGNOSIS — G8929 Other chronic pain: Secondary | ICD-10-CM | POA: Diagnosis not present

## 2016-08-29 DIAGNOSIS — M84375A Stress fracture, left foot, initial encounter for fracture: Secondary | ICD-10-CM

## 2016-08-29 DIAGNOSIS — I739 Peripheral vascular disease, unspecified: Secondary | ICD-10-CM | POA: Diagnosis not present

## 2016-08-29 DIAGNOSIS — M779 Enthesopathy, unspecified: Secondary | ICD-10-CM

## 2016-08-29 DIAGNOSIS — M25572 Pain in left ankle and joints of left foot: Secondary | ICD-10-CM | POA: Diagnosis not present

## 2016-08-29 MED ORDER — MELOXICAM 15 MG PO TABS: 15.0000 mg | ORAL_TABLET | Freq: Every day | ORAL | 0 refills | Status: DC

## 2016-08-29 NOTE — Progress Notes (Signed)
Subjective: Bridget Beck is a 46 y.o. female patient presents to office with complaint of heel pain on the left, states that pain is at back and the side of her heel, sometimes the pain radiates up her calf or down the side of her foot. Reports that she has pain even with use of CAM boot of which she has been in for 3 months. Patient also sees Dr. Patrice Paradise for her back and has had injections with continued back pain. Reports that her gait is off because of the pain in the heel and having to wear the boot and that Dr. Patrice Paradise recommended her to see a foot doctor to help her foot since she is not healing the heel fracture. Denies any other pedal complaints. Denies known injury, trauma or sprain that could've contributed to her started her left heel pain. Patient admits to a history of previous left foot problems, however, never of this intensity to her left heel.  Patient Active Problem List   Diagnosis Date Noted  . Colon cancer screening 05/08/2016  . Rectal bleeding 05/08/2016  . Achilles tendonitis 04/28/2016  . Bowel movement symptom 12/07/2014  . Intractable chronic migraine without aura and without status migrainosus 12/05/2014  . Weight gain 12/01/2013  . Left hip pain 12/01/2013  . Migraine without aura 04/20/2013  . Depression 04/26/2012  . Lumbar back pain with radiculopathy affecting left lower extremity 02/09/2012  . Memory changes 10/17/2011    Current Outpatient Prescriptions on File Prior to Visit  Medication Sig Dispense Refill  . gabapentin (NEURONTIN) 300 MG capsule Take 1 capsule (300 mg total) by mouth at bedtime. (Patient taking differently: Take by mouth. 1 in the morning, 1 in the afternoon, 2 in the evening) 90 capsule 1  . naproxen (NAPROSYN) 500 MG tablet Take 1 tablet with sumatriptan tablet 20 tablet 2  . promethazine (PHENERGAN) 25 MG tablet Take 1 tablet (25 mg total) by mouth every 6 (six) hours as needed for nausea. 40 tablet 3  . SUMAtriptan (IMITREX) 100 MG  tablet Take 1 tablet (100 mg total) by mouth as directed. May repeat in 2 hours if headache persists or recurs. 10 tablet 5  . SUMAtriptan Succinate 6 MG/0.5ML SOCT Inject 6 Mg into skin at earliest onset of migraine, may repeat in 1 hour if headache persists, or reoccurs 6 mL 5  . topiramate (TOPAMAX) 25 MG tablet Take 3 tablets (75 mg total) by mouth at bedtime. 270 tablet 3  . valACYclovir (VALTREX) 1000 MG tablet Take 2 tablets (2,000 mg total) by mouth 2 (two) times daily. For 1 day.  Repeat as needed with future episodes. 8 tablet 1   Current Facility-Administered Medications on File Prior to Visit  Medication Dose Route Frequency Provider Last Rate Last Dose  . 0.9 %  sodium chloride infusion  500 mL Intravenous Continuous [name], [name], MD        Allergies  Allergen Reactions  . Amitriptyline Other (See Comments)    Had severe migraine and pt could not see for 2 hours.  [name] [Pregabalin]     Migraine  . Methocarbamol Other (See Comments)    Stomach upset  . Prozac [Fluoxetine Hcl]     Fatigue   . Remeron [Mirtazapine]     "felt like a zombie"    Objective: Physical Exam General: The patient is alert and oriented x3 in no acute distress.  Dermatology: Skin is warm, dry and supple bilateral lower extremities. Nails 1-10 are normal.  There is no erythema, no eccymosis, no open lesions present. Integument is otherwise unremarkable.  Vascular: Dorsalis Pedis pulse 2/4 and Posterior Tibial pulse are 1/4 bilateral. Capillary fill time is immediate to all digits. There is significant varicosities with mild decreased temperature gradient is on left. Improved in pain with dependency of left leg.  Neurological: Grossly intact to light touch bilateral.  Musculoskeletal: Tenderness to palpation at the lateral calcaneal tubercale and through the insertion of the plantar fascia on the left foot. Minimal tenderness at the insertion of the Achilles on left. Minimal tenderness along the  anterior ankle tendons on left. Minimal pain with compression of calcaneus on left. No pain with calf compression on left. There is decreased Ankle joint range of motion bilateral. All other joints range of motion within normal limits bilateral, however, mild guarding on left. Strength 5/5 in all groups bilateral.   Gait: CAM boot assisted, Antalgic avoid weight on left heel  Assessment and Plan: Problem List Items Addressed This Visit    None    Visit Diagnoses    Inflammatory heel pain, left    -  Primary   Relevant Medications   meloxicam (MOBIC) 15 MG tablet   Other Relevant Orders   CBC with Differential   Basic Metabolic Panel   Uric Acid   ANA   Sedimentation Rate   C-reactive protein   HLA-B27 Antigen   Calcium   Alkaline phosphatase   D-dimer, quantitative (not at [facility])   Vitamin D, 25-hydroxy   Stress fracture of calcaneus, left, initial encounter       Relevant Orders   CBC with Differential   Basic Metabolic Panel   Uric Acid   ANA   Sedimentation Rate   C-reactive protein   HLA-B27 Antigen   Calcium   Alkaline phosphatase   D-dimer, quantitative (not at [facility])   Vitamin D, 25-hydroxy   Foot pain, left  (Chronic)      Relevant Orders   CBC with Differential   Basic Metabolic Panel   Uric Acid   ANA   Sedimentation Rate   C-reactive protein   HLA-B27 Antigen   Calcium   Alkaline phosphatase   D-dimer, quantitative (not at [facility])   Vitamin D, 25-hydroxy   Chronic pain of left ankle       Relevant Medications   meloxicam (MOBIC) 15 MG tablet   Other Relevant Orders   CBC with Differential   Basic Metabolic Panel   Uric Acid   ANA   Sedimentation Rate   C-reactive protein   HLA-B27 Antigen   Calcium   Alkaline phosphatase   D-dimer, quantitative (not at [facility])   Vitamin D, 25-hydroxy   Tendonitis       Relevant Orders   CBC with Differential   Basic Metabolic Panel   Uric Acid   ANA   Sedimentation Rate   C-reactive protein   HLA-B27  Antigen   Calcium   Alkaline phosphatase   D-dimer, quantitative (not at [facility])   Vitamin D, 25-hydroxy   Plantar fasciitis       PVD (peripheral vascular disease) (HCC)         -Complete examination performed.  -MRI results reviewed -Discussed with patient in detail the condition of suspected stress fracture versus inflammatory heel pain/ plantar fasciitis with compensation tendinitis, how this occurs and general treatment options. Explained both conservative and surgical treatments.  -Ordered CT scan to further evaluate for suspected/possible stress fracture of left heel -Ordered arthritic panel  and inflammatory panel as well to evaluate further for any causes that may be underlying as to why patient experiencing extreme pain in her foot that is not relieved with CAM boot immobilization 3 months -Recommend patient to continue with CAM boot until after further testing -Ordered ABIs, PVRs for evaluation of her circulation. Patient has decreased temperature changes and color changes and pain that is improved with dependency and a history of smoking. -Recommended rest, ice, elevation and compression. Compression sleeve was dispensed to patient. -Prescribed meloxicam for patient to have to assist with inflammation and swelling and pain at heel -Recommend continue with medical leave until the extent of her left foot pain can be further evaluated -Patient to return to office after circulation test, after CT scan, and after blood work or sooner if problems or questions arise.  [name], [name]

## 2016-08-30 DIAGNOSIS — M84375A Stress fracture, left foot, initial encounter for fracture: Secondary | ICD-10-CM | POA: Diagnosis not present

## 2016-08-30 DIAGNOSIS — M79672 Pain in left foot: Secondary | ICD-10-CM | POA: Diagnosis not present

## 2016-08-30 DIAGNOSIS — M779 Enthesopathy, unspecified: Secondary | ICD-10-CM | POA: Diagnosis not present

## 2016-08-30 DIAGNOSIS — G8929 Other chronic pain: Secondary | ICD-10-CM | POA: Diagnosis not present

## 2016-08-30 DIAGNOSIS — M25572 Pain in left ankle and joints of left foot: Secondary | ICD-10-CM | POA: Diagnosis not present

## 2016-09-02 ENCOUNTER — Telehealth: Payer: Self-pay | Admitting: *Deleted

## 2016-09-02 DIAGNOSIS — S92902A Unspecified fracture of left foot, initial encounter for closed fracture: Secondary | ICD-10-CM

## 2016-09-02 DIAGNOSIS — I739 Peripheral vascular disease, unspecified: Secondary | ICD-10-CM

## 2016-09-02 NOTE — Telephone Encounter (Addendum)
-----   Message from [name], [place] sent at 08/29/2016  5:35 PM EDT ----- Regarding: CT scan and ABIs/PVRs Left heel suspected stress fracture on MRI with pain; please order CT for further eval of heel since pain has been present for 3 months  ABI/PVRs swelling with cold feeling and color changes to the left leg with pain that is improved with dependency vs elevation  Please arrange test at [facility] or close to  for patient Thanks Dr. Cannon Kettle.09/02/2016-CT Scan faxed to [facility]. ABI with TBI, Arterial Dopplers faxed to [facility].

## 2016-09-03 NOTE — Telephone Encounter (Deleted)
-----  Message from [name], [place] sent at 09/02/2016 12:16 PM EDT ----- Can you let patient know that her D-dimer is elevated and Vit D is low. I am still waiting on her HLA B 27 test to result but meanwhile have patient to call her PCP for recommendations on elevated D-dimer. Also ask if she is having increased calf swelling, chest pain or shortness of breath. If she is then she may need to go to the ER to have them check for a blood clot. Thanks Dr. Cannon Kettle.09/03/2016-Left message requesting a call back to discuss urgent labs.

## 2016-09-04 ENCOUNTER — Emergency Department: Payer: 59

## 2016-09-04 ENCOUNTER — Encounter: Payer: Self-pay | Admitting: *Deleted

## 2016-09-04 ENCOUNTER — Emergency Department
Admission: EM | Admit: 2016-09-04 | Discharge: 2016-09-04 | Disposition: A | Discharge status: Home / Self Care | Payer: 59 | Attending: Emergency Medicine | Admitting: Emergency Medicine

## 2016-09-04 ENCOUNTER — Encounter (INDEPENDENT_AMBULATORY_CARE_PROVIDER_SITE_OTHER): Payer: 59

## 2016-09-04 DIAGNOSIS — M79605 Pain in left leg: Secondary | ICD-10-CM

## 2016-09-04 DIAGNOSIS — R600 Localized edema: Secondary | ICD-10-CM | POA: Diagnosis not present

## 2016-09-04 DIAGNOSIS — R079 Chest pain, unspecified: Secondary | ICD-10-CM | POA: Insufficient documentation

## 2016-09-04 DIAGNOSIS — R7989 Other specified abnormal findings of blood chemistry: Secondary | ICD-10-CM | POA: Diagnosis not present

## 2016-09-04 DIAGNOSIS — Z87891 Personal history of nicotine dependence: Secondary | ICD-10-CM | POA: Insufficient documentation

## 2016-09-04 DIAGNOSIS — M79662 Pain in left lower leg: Secondary | ICD-10-CM | POA: Diagnosis not present

## 2016-09-04 LAB — TROPONIN I: Troponin I: <0.03 ng/mL (ref <0.03)

## 2016-09-04 LAB — CBC
HEMATOCRIT: 40.9 % (ref 35.0–47.0)
Hemoglobin: 14.2 g/dL (ref 12.0–16.0)
MCH: 30 pg (ref 26.0–34.0)
MCHC: 34.7 g/dL (ref 32.0–36.0)
MCV: 86.2 fL (ref 80.0–100.0)
Platelets: 288 10*3/uL (ref 150–440)
RBC: 4.75 MIL/uL (ref 3.80–5.20)
RDW: 13.3 % (ref 11.5–14.5)
WBC: 8 10*3/uL (ref 3.6–11.0)

## 2016-09-04 LAB — BASIC METABOLIC PANEL
Anion gap: 5 (ref 5–15)
BUN: 19 mg/dL (ref 6–20)
CHLORIDE: 111 mmol/L (ref 101–111)
CO2: 24 mmol/L (ref 22–32)
Calcium: 9.3 mg/dL (ref 8.9–10.3)
Creatinine, Ser: 0.85 mg/dL (ref 0.44–1.00)
GFR calc Af Amer: >60 mL/min (ref >60)
GFR calc non Af Amer: >60 mL/min (ref >60)
Glucose, Bld: 103 mg/dL — ABNORMAL HIGH (ref 65–99)
POTASSIUM: 4.1 mmol/L (ref 3.5–5.1)
Sodium: 140 mmol/L (ref 135–145)

## 2016-09-04 MED ORDER — TRAMADOL HCL 50 MG PO TABS: 50.0000 mg | ORAL_TABLET | Freq: Four times a day (QID) | ORAL | 0 refills | Status: AC | PRN

## 2016-09-04 MED ORDER — OXYCODONE-ACETAMINOPHEN 5-325 MG PO TABS
2.0000 | ORAL_TABLET | Freq: Once | ORAL | Status: AC
  Administered 2016-09-04: 2 via ORAL
  Filled 2016-09-04: qty 2

## 2016-09-04 NOTE — Telephone Encounter (Signed)
Ok thank you 

## 2016-09-04 NOTE — ED Notes (Signed)
Discussed discharge instructions, prescriptions, and follow-up care with patient. No questions or concerns at this time. Pt stable at discharge.  

## 2016-09-04 NOTE — ED Notes (Signed)
Pt to xray

## 2016-09-04 NOTE — ED Triage Notes (Signed)
States she has had intermittent swelling of her left leg, states she had several tests scheduled ([place] and ddimer), states she was told called this AM and told she had an elevated d-dimer, states some mild left sided chest pain, awake and alert in no acute distress

## 2016-09-04 NOTE — ED Provider Notes (Signed)
[facility] Emergency Department Provider Note       Time seen: ----------------------------------------- 10:19 AM on 09/04/2016 -----------------------------------------     I have reviewed the triage vital signs and the nursing notes.   HISTORY   Chief Complaint Abnormal Lab    HPI Bridget Beck is a 46 y.o. female who presents to the ED for intermittent swelling of the left leg. Patient states she had several test scheduled because of chronic left leg pain and left heel pain. One of these tests was a d-dimer which was ordered by her podiatrist was found to be elevated. She has had some intermittent left-sided chest pain over the past 2 weeks. Nothing makes her symptoms better. She has tried numerous treatments for her heel without any improvement.   Past Medical History:  Diagnosis Date  . Depression   . GERD (gastroesophageal reflux disease)   . Migraines    with aura    Patient Active Problem List   Diagnosis Date Noted  . Colon cancer screening 05/08/2016  . Rectal bleeding 05/08/2016  . Achilles tendonitis 04/28/2016  . Bowel movement symptom 12/07/2014  . Intractable chronic migraine without aura and without status migrainosus 12/05/2014  . Weight gain 12/01/2013  . Left hip pain 12/01/2013  . Migraine without aura 04/20/2013  . Depression 04/26/2012  . Lumbar back pain with radiculopathy affecting left lower extremity 02/09/2012  . Memory changes 10/17/2011    Past Surgical History:  Procedure Laterality Date  . CERVICAL FUSION     x 2  . INCONTINENCE SURGERY  2004    Allergies Amitriptyline; Lyrica [pregabalin]; Methocarbamol; Prozac [fluoxetine hcl]; and Remeron [mirtazapine]  Social History Social History  Substance Use Topics  . Smoking status: Former Smoker    Packs/day: 0.10    Years: 3.00    Types: Cigarettes    Quit date: 10/28/2012  . Smokeless tobacco: Former Systems developer    Quit date: 11/10/2012  . Alcohol use No     Review of Systems Constitutional: Negative for fever. Eyes: Negative for vision changes ENT:  Negative for congestion, sore throat Cardiovascular: Positive for chest pain Respiratory: Negative for shortness of breath. Gastrointestinal: Negative for abdominal pain, vomiting and diarrhea. Genitourinary: Negative for dysuria. Musculoskeletal: Positive for left heel pain and left leg pain Skin: Negative for rash. Neurological: Negative for headaches, focal weakness or numbness.  All systems negative/normal/unremarkable except as stated in the HPI  ____________________________________________   PHYSICAL EXAM:  VITAL SIGNS: ED Triage Vitals  Enc Vitals Group     BP 09/04/16 1013 (!) 120/56     Pulse Rate 09/04/16 1013 81     Resp 09/04/16 1013 16     Temp 09/04/16 1013 98 F (36.7 C)     Temp Source 09/04/16 1013 Oral     SpO2 09/04/16 1013 98 %     Weight 09/04/16 1012 222 lb (100.7 kg)     Height 09/04/16 1012 5\' 3"  (1.6 m)     Head Circumference --      Peak Flow --      Pain Score 09/04/16 1012 3     Pain Loc --      Pain Edu? --      Excl. in [place]? --     Constitutional: Alert and oriented. Well appearing and in no distress. Eyes: Conjunctivae are normal. Normal extraocular movements. ENT   Head: Normocephalic and atraumatic.   Nose: No congestion/rhinnorhea.   Mouth/Throat: Mucous membranes are moist.  Neck: No stridor. Cardiovascular: Normal rate, regular rhythm. No murmurs, rubs, or gallops. Respiratory: Normal respiratory effort without tachypnea nor retractions. Breath sounds are clear and equal bilaterally. No wheezes/rales/rhonchi. Gastrointestinal: Soft and nontender. Normal bowel sounds Musculoskeletal: Nontender with normal range of motion in extremities. No lower extremity tenderness nor edema. Neurologic:  Normal speech and language. No gross focal neurologic deficits are appreciated.  Skin:  Skin is warm, dry and intact. No rash  noted. Psychiatric: Mood and affect are normal. Speech and behavior are normal.  ____________________________________________  EKG: Interpreted by me.Sinus rhythm rate 86 bpm, normal PR interval, normal QRS, normal QT.  ____________________________________________  ED COURSE:  Pertinent labs & imaging results that were available during my care of the patient were reviewed by me and considered in my medical decision making (see chart for details). Patient presents for leg pain and concern for DVT with some intermittent chest pain., we will assess with labs and imaging as indicated.   Procedures ____________________________________________   LABS (pertinent positives/negatives)  Labs Reviewed  BASIC METABOLIC PANEL - Abnormal; Notable for the following:       Result Value   Glucose, Bld 103 (*)    All other components within normal limits  CBC  TROPONIN I    RADIOLOGY Images were viewed by me  Chest x-ray, venous ultrasound  IMPRESSION: No evidence of deep venous thrombosis in the left lower extremity. Right common femoral vein also patent. IMPRESSION: No active cardiopulmonary disease. ____________________________________________  FINAL ASSESSMENT AND PLAN  Leg pain, chest wall pain  Plan: Patient's labs and imaging were dictated above. Patient had presented for Leg pain and chest wall pain of uncertain etiology. I will prescribe a short supply pain medicine but will encourage increase activity and outpatient follow-up as scheduled.   Earleen Newport, MD   Note: This note was generated in part or whole with voice recognition software. Voice recognition is usually quite accurate but there are transcription errors that can and very often do occur. I apologize for any typographical errors that were not detected and corrected.     Earleen Newport, MD 09/04/16 1230

## 2016-09-04 NOTE — Telephone Encounter (Addendum)
-----  Message from [name], [place] sent at 09/02/2016 12:16 PM EDT ----- Can you let patient know that her D-dimer is elevated and Vit D is low. I am still waiting on her HLA B 27 test to result but meanwhile have patient to call her PCP for recommendations on elevated D-dimer. Also ask if she is having increased calf swelling, chest pain or shortness of breath. If she is then she may need to go to the ER to have them check for a blood clot. Thanks Dr. Cannon Kettle. 09/03/2016-Left message informing pt that I needed to discuss urgent labs with her to please call my office.09/04/2016-I spoke with pt and explained the D-Dimer, pt states having left side chest pain on and off, denies shortness of breath, or increased calf size. I instructed pt to go to the ED, pt refused states has appt this morning to have the circulation [place].I told pt I could call Dr. Elsie Stain to get her in today and she states he would just send to ED. I told pt I would rather her go to the ED but would order Venous Doppler r/o DVT for left leg. I left message informing Dr. Cannon Kettle of pt's refusal to go to ED and she states the best we could do was get venous doppler at this time.Dr. Cannon Kettle called states okay to order venous doppler and reiterate to pt ED would benefit her health best. I ordered venous doppler with [facility], and Tiffany stated can only do Venous doppler and ABI with TBI today. I informed pt of the new orders and encouraged her to go to the ED. Pt agreed to go to the ED, but was going to wait for her husband to get home to take her. I told her that if she was going to drive to the testing appt, she should drive to the ED and not wait like she could be waiting at the testing office about 1hour and 30 minutes. Pt agreed. I called [facility], I informed her of pt's left chest pain, left leg swelling and pain, and elevated D-Dimer and would need evaluation for pulmonary embolism, and DVT.

## 2016-09-05 LAB — CBC WITH DIFFERENTIAL/PLATELET
BASOS ABS: 0 10*3/uL (ref 0.0–0.2)
Basos: 0 %
EOS (ABSOLUTE): 0.2 10*3/uL (ref 0.0–0.4)
Eos: 2 %
HEMOGLOBIN: 14.2 g/dL (ref 11.1–15.9)
Hematocrit: 41.6 % (ref 34.0–46.6)
IMMATURE GRANS (ABS): 0 10*3/uL (ref 0.0–0.1)
IMMATURE GRANULOCYTES: 0 %
LYMPHS ABS: 2.2 10*3/uL (ref 0.7–3.1)
LYMPHS: 25 %
MCH: 29.8 pg (ref 26.6–33.0)
MCHC: 34.1 g/dL (ref 31.5–35.7)
MCV: 87 fL (ref 79–97)
MONOCYTES: 7 %
Monocytes Absolute: 0.6 10*3/uL (ref 0.1–0.9)
Neutrophils Absolute: 5.8 10*3/uL (ref 1.4–7.0)
Neutrophils: 66 %
Platelets: 312 10*3/uL (ref 150–379)
RBC: 4.76 x10E6/uL (ref 3.77–5.28)
RDW: 14.2 % (ref 12.3–15.4)
WBC: 8.9 10*3/uL (ref 3.4–10.8)

## 2016-09-05 LAB — BASIC METABOLIC PANEL
BUN / CREAT RATIO: 18 (ref 9–23)
BUN: 14 mg/dL (ref 6–24)
CHLORIDE: 106 mmol/L (ref 96–106)
CO2: 23 mmol/L (ref 20–29)
Calcium: 9.1 mg/dL (ref 8.7–10.2)
Creatinine, Ser: 0.8 mg/dL (ref 0.57–1.00)
GFR calc non Af Amer: 88 mL/min/{1.73_m2} (ref >59)
GFR, EST AFRICAN AMERICAN: 102 mL/min/{1.73_m2} (ref >59)
GLUCOSE: 86 mg/dL (ref 65–99)
POTASSIUM: 4.7 mmol/L (ref 3.5–5.2)
Sodium: 141 mmol/L (ref 134–144)

## 2016-09-05 LAB — D-DIMER, QUANTITATIVE: D-DIMER: 0.56 mg/L FEU — ABNORMAL HIGH (ref 0.00–0.49)

## 2016-09-05 LAB — HLA-B27 ANTIGEN: HLA B27: NEGATIVE

## 2016-09-05 LAB — C-REACTIVE PROTEIN: CRP: 0.7 mg/L (ref 0.0–4.9)

## 2016-09-05 LAB — VITAMIN D 25 HYDROXY (VIT D DEFICIENCY, FRACTURES): VIT D 25 HYDROXY: 26.3 ng/mL — AB (ref 30.0–100.0)

## 2016-09-05 LAB — SEDIMENTATION RATE: Sed Rate: 2 mm/hr (ref 0–32)

## 2016-09-05 LAB — ALKALINE PHOSPHATASE: Alkaline Phosphatase: 60 IU/L (ref 39–117)

## 2016-09-05 LAB — URIC ACID: Uric Acid: 2.6 mg/dL (ref 2.5–7.1)

## 2016-09-06 ENCOUNTER — Telehealth: Payer: Self-pay | Admitting: Neurology

## 2016-09-06 ENCOUNTER — Other Ambulatory Visit: Payer: Self-pay | Admitting: *Deleted

## 2016-09-06 NOTE — Telephone Encounter (Signed)
Patient said that the medrol dose pak does not work for her.  She is requesting prednisone taper.

## 2016-09-06 NOTE — Telephone Encounter (Signed)
PT called and said she has had a headache for a week and wants to know if he can call in a prescription for prednisone

## 2016-09-06 NOTE — Telephone Encounter (Signed)
Rx called in 

## 2016-09-06 NOTE — Telephone Encounter (Signed)
You may send in RX for Medrol dosePak with no refills

## 2016-09-06 NOTE — Telephone Encounter (Signed)
Prednisone 10mg - 6 tablets day1, then 5 tablets day2, then 4 tablets day 3, then 3 tablets day 4, then 2 tablets day 5, then 1 tablet day 6 and d/c.  #6.  [id number]

## 2016-09-09 ENCOUNTER — Ambulatory Visit
Admission: RE | Admit: 2016-09-09 | Discharge: 2016-09-09 | Disposition: A | Discharge status: Home / Self Care | Payer: 59 | Source: Ambulatory Visit | Attending: Sports Medicine | Admitting: Sports Medicine

## 2016-09-09 DIAGNOSIS — X58XXXA Exposure to other specified factors, initial encounter: Secondary | ICD-10-CM | POA: Insufficient documentation

## 2016-09-09 DIAGNOSIS — M79672 Pain in left foot: Secondary | ICD-10-CM | POA: Diagnosis not present

## 2016-09-09 DIAGNOSIS — S92902A Unspecified fracture of left foot, initial encounter for closed fracture: Secondary | ICD-10-CM | POA: Insufficient documentation

## 2016-09-12 ENCOUNTER — Ambulatory Visit: Payer: 59 | Admitting: Sports Medicine

## 2016-09-17 NOTE — Telephone Encounter (Addendum)
-----   Message from [name], [place] sent at 09/09/2016  9:58 PM EDT ----- Can you let patient know that there is NO FRACTURE on her CT scan. There is evidence of mild arthritis but No fracture  Thanks Dr. Cannon Kettle.09/17/2016-I informed pt of Dr. Leeanne Rio review of CT Scan.

## 2016-09-19 ENCOUNTER — Ambulatory Visit (INDEPENDENT_AMBULATORY_CARE_PROVIDER_SITE_OTHER): Payer: 59 | Admitting: Sports Medicine

## 2016-09-19 DIAGNOSIS — M25572 Pain in left ankle and joints of left foot: Secondary | ICD-10-CM

## 2016-09-19 DIAGNOSIS — M79672 Pain in left foot: Secondary | ICD-10-CM

## 2016-09-19 DIAGNOSIS — Q688 Other specified congenital musculoskeletal deformities: Secondary | ICD-10-CM

## 2016-09-19 DIAGNOSIS — M722 Plantar fascial fibromatosis: Secondary | ICD-10-CM | POA: Diagnosis not present

## 2016-09-19 DIAGNOSIS — M779 Enthesopathy, unspecified: Secondary | ICD-10-CM

## 2016-09-19 NOTE — Progress Notes (Signed)
Subjective: Bridget Beck is a 46 y.o. female patient returns to office for follow-up evaluation of heel pain on the left, states that pain is very intense with the first few steps, especially out of bed in the morning or over in the night to the point to where she has to grab onto the dresser or things around her to walk and stand. States that the pain is a shooting type of sensation to her left heel. Patient states that she hurts with or without the boot and has not made any difference to this point. Patient states that she did not get any relief with the prednisone as given by her neurologist nor with the meloxicam was given on last visit. Patient states that she tried Tylenol PM, which helped a little bit. However, made her feel a little groggy and sleepy and is not sure if the medication with taking her gabapentin caused this type of effect. Patient also is following up after going to the emergency room as her d-dimer was elevated on her last blood work. Patient. There at the emergency room, had a venous study done which did not show any DVT. Patient was discharged home with tramadol which she has not gotten field. Patient also underwent CT scan of left heel of which she is here today to review results. No other issues noted.   Patient Active Problem List   Diagnosis Date Noted  . Colon cancer screening 05/08/2016  . Rectal bleeding 05/08/2016  . Achilles tendonitis 04/28/2016  . Bowel movement symptom 12/07/2014  . Intractable chronic migraine without aura and without status migrainosus 12/05/2014  . Weight gain 12/01/2013  . Left hip pain 12/01/2013  . Migraine without aura 04/20/2013  . Depression 04/26/2012  . Lumbar back pain with radiculopathy affecting left lower extremity 02/09/2012  . Memory changes 10/17/2011    Current Outpatient Prescriptions on File Prior to Visit  Medication Sig Dispense Refill  . gabapentin (NEURONTIN) 300 MG capsule Take 1 capsule (300 mg total) by mouth at  bedtime. (Patient taking differently: Take 300 mg by mouth 4 (four) times daily. 1 in the morning, 1 in the afternoon, 2 in the evening) 90 capsule 1  . meloxicam (MOBIC) 15 MG tablet Take 1 tablet (15 mg total) by mouth daily. 30 tablet 0  . naproxen (NAPROSYN) 500 MG tablet Take 1 tablet with sumatriptan tablet 20 tablet 2  . promethazine (PHENERGAN) 25 MG tablet Take 1 tablet (25 mg total) by mouth every 6 (six) hours as needed for nausea. 40 tablet 3  . SUMAtriptan (IMITREX) 100 MG tablet Take 1 tablet (100 mg total) by mouth as directed. May repeat in 2 hours if headache persists or recurs. 10 tablet 5  . SUMAtriptan Succinate 6 MG/0.5ML SOCT Inject 6 Mg into skin at earliest onset of migraine, may repeat in 1 hour if headache persists, or reoccurs 6 mL 5  . topiramate (TOPAMAX) 25 MG tablet Take 3 tablets (75 mg total) by mouth at bedtime. 270 tablet 3  . traMADol (ULTRAM) 50 MG tablet Take 1 tablet (50 mg total) by mouth every 6 (six) hours as needed. 20 tablet 0  . valACYclovir (VALTREX) 1000 MG tablet Take 2 tablets (2,000 mg total) by mouth 2 (two) times daily. For 1 day.  Repeat as needed with future episodes. 8 tablet 1   Current Facility-Administered Medications on File Prior to Visit  Medication Dose Route Frequency Provider Last Rate Last Dose  . 0.9 %  sodium chloride  infusion  500 mL Intravenous Continuous [name], [name], MD        Allergies  Allergen Reactions  . Amitriptyline Other (See Comments)    Had severe migraine and pt could not see for 2 hours.  [name] [Pregabalin]     Migraine  . Methocarbamol Other (See Comments)    Stomach upset  . Prozac [Fluoxetine Hcl]     Fatigue   . Remeron [Mirtazapine]     "felt like a zombie"    Objective: Physical Exam General: The patient is alert and oriented x3 in no acute distress.  Dermatology: Skin is warm, dry and supple bilateral lower extremities. Nails 1-10 are normal. There is no erythema, no eccymosis, no open  lesions present. Integument is otherwise unremarkable.  Vascular: Dorsalis Pedis pulse 2/4 and Posterior Tibial pulse are 1/4 bilateral. Capillary fill time is immediate to all digits. There are varicosities with mild decreased temperature gradient is on left. Mild improvement in pain with dependency.  Neurological: Grossly intact to light touch bilateral.  Musculoskeletal: Tenderness to palpation at the medial greater than lateral calcaneal tubercale and through the insertion of the plantar fascia on the left foot with moderate intensity. There is mild tenderness at the insertion of the Achilles on left. Minimal tenderness along the anterior ankle tendons on left. Minimal pain with compression of calcaneus on left. No pain with calf compression on left. There is decreased Ankle joint range of motion bilateral with guarding on the left due to pain. All other joints range of motion within normal limits bilateral, however, mild guarding on left. Strength 5/5 in all groups bilateral.   Gait: CAM boot assisted, Antalgic avoids weight on left heel  CT Scan CT OF THE LEFT FOOT WITHOUT CONTRAST  TECHNIQUE: Multidetector CT imaging of the left foot was performed according to the standard protocol. Multiplanar CT image reconstructions were also generated.  COMPARISON:  MRI left foot 06/14/2016  FINDINGS: Bones/Joint/Cartilage  No fracture or dislocation. Normal alignment. No joint effusion. Mild osteoarthritis of the navicular- medial cuneiform with subchondral cystic changes in the medial cuneiform.  Prominent os trigonum with reactive changes on either side of the pseudoarticulation.  Small plantar calcaneal spur.  Ligaments  Ligaments are suboptimally evaluated by CT.  Muscles and Tendons Muscles are normal. Flexor, extensor, peroneal and Achilles tendons are intact.  Soft tissue No fluid collection or hematoma.  No soft tissue mass.  IMPRESSION: 1.  No acute osseous  injury of the left foot.   Electronically Signed   By: Kathreen Devoid   On: 09/09/2016 15:32  Assessment and Plan: Problem List Items Addressed This Visit    None    Visit Diagnoses    Inflammatory heel pain, left    -  Primary   Foot pain, left       Left ankle pain, unspecified chronicity       Plantar fasciitis       Os trigonum syndrome       Tendonitis         -Complete examination performed.  -CT results reviewed; CT negative for any acute osseous injury. No heel fracture. -Discussed with patient in detail the condition of heel pain/ plantar fasciitis with compensation tendinitis, how this occurs and general treatment options. Explained both conservative and surgical treatments.  -After oral consent and aseptic prep, injected a mixture containing 1 ml of 2%  plain lidocaine, 1 ml 0.5% plain marcaine, 0.5 ml of kenalog 10 and 0.5 ml of  dexamethasone phosphate into left heel at the medial insertion of the plantar fascia at area of most pain without complication. Post-injection care discussed with patient. -Advised patient to ice 1-2 times daily as instructed -Advised patient to transition out of CAM boot since it is not helping to good supportive tennis shoe with heel lift -Dispensed night splint to use on left as instructed -Advised patient that if her pain is severe, she can take her tramadol medication, however, must wait at least 4 hours between this prescription and her gabapentin to avoid any type of cross medication reaction  -We will hold off on ABIs at this time. If at next visit there is no improvement with symptoms, we will reorder ABIs since this test was not performed when she was seen in the emergency department;  Patient has decreased temperature and faint color changes and pain that is improved with dependency and a history of smoking. - Patient gave me her [employer] disability paperwork to fill out on her behalf; I had a thorough conversation with patient about her  return to work status. Patient states at this point, her pain is very severe in her foot that she feels like she cannot return to her normal work duties. I even asked if she felt like she was capable of doing sedentary work with limited walking and standing. Patient replied that she does not think at this time that she could do any type of work due to her heel pain. I completed disability paperwork for patient with expectation that patient should see some improvement with her symptoms within the next 4-6 weeks. However, the time frame to full recovery is unknown. -Patient to return to office in 2 weeks or sooner if problems or questions arise.  [name], [name]

## 2016-09-20 ENCOUNTER — Encounter: Payer: Self-pay | Admitting: Sports Medicine

## 2016-10-03 ENCOUNTER — Ambulatory Visit (INDEPENDENT_AMBULATORY_CARE_PROVIDER_SITE_OTHER): Payer: 59 | Admitting: Sports Medicine

## 2016-10-03 DIAGNOSIS — M779 Enthesopathy, unspecified: Secondary | ICD-10-CM

## 2016-10-03 DIAGNOSIS — M79672 Pain in left foot: Secondary | ICD-10-CM

## 2016-10-03 DIAGNOSIS — Q688 Other specified congenital musculoskeletal deformities: Secondary | ICD-10-CM

## 2016-10-03 DIAGNOSIS — I739 Peripheral vascular disease, unspecified: Secondary | ICD-10-CM

## 2016-10-03 DIAGNOSIS — M722 Plantar fascial fibromatosis: Secondary | ICD-10-CM

## 2016-10-03 MED ORDER — TRIAMCINOLONE ACETONIDE 40 MG/ML IJ SUSP: 20.0000 mg | Freq: Once | INTRAMUSCULAR | Status: AC

## 2016-10-03 MED ORDER — LIDOCAINE 5 % EX PTCH: 1.0000 | MEDICATED_PATCH | CUTANEOUS | 0 refills | Status: DC

## 2016-10-03 NOTE — Progress Notes (Signed)
Subjective: Bridget Beck is a 46 y.o. female patient returns to office for follow-up evaluation of heel pain on the left, states that pain is now about the same no better or no worse. States that it feels like she is walking on a rock and her heel. States that her foot is swollen and hurts. States that she has difficulty where her left shoe feels a little tighter and no shoes seems to be comfortable. Reports stiffness with motion and pain, still worse first in the morning and in the evening as her day progresses. States that she tried her tramadol; It seemed to help but it didn't give her a migraine, so she did only take it for 2 days. No other issues noted.   Patient Active Problem List   Diagnosis Date Noted  . Colon cancer screening 05/08/2016  . Rectal bleeding 05/08/2016  . Achilles tendonitis 04/28/2016  . Bowel movement symptom 12/07/2014  . Intractable chronic migraine without aura and without status migrainosus 12/05/2014  . Weight gain 12/01/2013  . Left hip pain 12/01/2013  . Migraine without aura 04/20/2013  . Depression 04/26/2012  . Lumbar back pain with radiculopathy affecting left lower extremity 02/09/2012  . Memory changes 10/17/2011    Current Outpatient Prescriptions on File Prior to Visit  Medication Sig Dispense Refill  . gabapentin (NEURONTIN) 300 MG capsule Take 1 capsule (300 mg total) by mouth at bedtime. (Patient taking differently: Take 300 mg by mouth 4 (four) times daily. 1 in the morning, 1 in the afternoon, 2 in the evening) 90 capsule 1  . meloxicam (MOBIC) 15 MG tablet Take 1 tablet (15 mg total) by mouth daily. 30 tablet 0  . naproxen (NAPROSYN) 500 MG tablet Take 1 tablet with sumatriptan tablet 20 tablet 2  . promethazine (PHENERGAN) 25 MG tablet Take 1 tablet (25 mg total) by mouth every 6 (six) hours as needed for nausea. 40 tablet 3  . SUMAtriptan (IMITREX) 100 MG tablet Take 1 tablet (100 mg total) by mouth as directed. May repeat in 2 hours if  headache persists or recurs. 10 tablet 5  . SUMAtriptan Succinate 6 MG/0.5ML SOCT Inject 6 Mg into skin at earliest onset of migraine, may repeat in 1 hour if headache persists, or reoccurs 6 mL 5  . topiramate (TOPAMAX) 25 MG tablet Take 3 tablets (75 mg total) by mouth at bedtime. 270 tablet 3  . traMADol (ULTRAM) 50 MG tablet Take 1 tablet (50 mg total) by mouth every 6 (six) hours as needed. 20 tablet 0  . valACYclovir (VALTREX) 1000 MG tablet Take 2 tablets (2,000 mg total) by mouth 2 (two) times daily. For 1 day.  Repeat as needed with future episodes. 8 tablet 1   Current Facility-Administered Medications on File Prior to Visit  Medication Dose Route Frequency Provider Last Rate Last Dose  . 0.9 %  sodium chloride infusion  500 mL Intravenous Continuous [name], [name], MD        Allergies  Allergen Reactions  . Amitriptyline Other (See Comments)    Had severe migraine and pt could not see for 2 hours.  [name] [Pregabalin]     Migraine  . Methocarbamol Other (See Comments)    Stomach upset  . Prozac [Fluoxetine Hcl]     Fatigue   . Remeron [Mirtazapine]     "felt like a zombie"    Objective: Physical Exam General: The patient is alert and oriented x3 in no acute distress.  Dermatology: Skin  is warm, dry and supple bilateral lower extremities. Nails 1-10 are normal. There is no erythema, no eccymosis, no open lesions present. Integument is otherwise unremarkable.  Vascular: Dorsalis Pedis pulse 2/4 and Posterior Tibial pulse are 1/4 bilateral. Capillary fill time is immediate to all digits. There are varicosities with mild decreased temperature gradient is on left. Mild improvement in pain with dependency.  Neurological: Grossly intact to light touch bilateral.  Musculoskeletal: Tenderness to palpation at the medial greater than lateral calcaneal tubercale and through the insertion of the plantar fascia on the left foot with moderate intensity at the medial aspect. There is  minimal tenderness at the insertion of the Achilles on left. Minimal tenderness along the anterior ankle tendons on left. No pain with compression of calcaneus on left. No pain with calf compression on left. There is decreased Ankle joint range of motion bilateral with guarding on the left due to pain. All other joints range of motion within normal limits bilateral, however, mild guarding on left. Strength 5/5 in all groups bilateral.   Assessment and Plan: Problem List Items Addressed This Visit    None    Visit Diagnoses    Inflammatory heel pain, left    -  Primary   Relevant Medications   lidocaine (LIDODERM) 5 %   triamcinolone acetonide (KENALOG-40) injection 20 mg (Start on 10/03/2016  7:00 PM)   Plantar fasciitis       Relevant Medications   lidocaine (LIDODERM) 5 %   triamcinolone acetonide (KENALOG-40) injection 20 mg (Start on 10/03/2016  7:00 PM)   Tendonitis       Os trigonum syndrome       Foot pain, left       PVD (peripheral vascular disease) (HCC)         -Complete examination performed.  -Re-discussed with patient in detail the condition of inflammatory heel pain/ plantar fasciitis with compensation tendinitis, how this occurs and general treatment options. Explained both conservative and surgical treatments.  -After oral consent and aseptic prep, injected a mixture containing 1 ml of 2%  plain lidocaine, 1 ml 0.5% plain marcaine, 0.5 ml of kenalog 10 and 0.5 ml of dexamethasone phosphate into left heel at the medial insertion of the plantar fascia at area of most pain via a plantar approach without complication. This is injection #2 to the site. Post-injection care discussed with patient. -Prescribed Lidoderm patch for pain -Prescribed physical therapy with additional treatment modalities for edema reduction range of motion, gait stability and strengthening -Advised patient to ice 1-2 times daily as instructed previously -Continue with good supportive tennis shoe with heel  lift as tolerated -Continue with night splint to use on left as previously instructed -Dispensed compression sleeve to assist with edema control that has silicone cushioning at area of heel for left  -We will hold off on ABIs at this time. If at next visit there, after starting physical therapy. If there is still no improvement with symptoms, we will reorder ABIs since this test was not performed when she was seen in the emergency department on 09/04/2016;  Patient has decreased temperature and faint color changes and pain that is improved with dependency and a history of smoking. -Patient is limited to activities to tolerance -Projected return to work date 11/12/2016, pending clinical course  -Patient to return to office in 2 weeks for follow-up evaluation after starting physical therapy or sooner if problems or questions arise.  [name], [name]

## 2016-10-04 ENCOUNTER — Other Ambulatory Visit: Payer: Self-pay | Admitting: Sports Medicine

## 2016-10-04 DIAGNOSIS — M722 Plantar fascial fibromatosis: Secondary | ICD-10-CM

## 2016-10-04 DIAGNOSIS — M79672 Pain in left foot: Secondary | ICD-10-CM

## 2016-10-04 MED ORDER — LIDOCAINE 5 % EX PTCH: 1.0000 | MEDICATED_PATCH | CUTANEOUS | 0 refills | Status: DC

## 2016-10-04 NOTE — Progress Notes (Signed)
Changed pharmacy to [facility] for lidoderm patch -Dr. Cannon Kettle

## 2016-10-07 ENCOUNTER — Telehealth: Payer: Self-pay | Admitting: *Deleted

## 2016-10-07 DIAGNOSIS — M722 Plantar fascial fibromatosis: Secondary | ICD-10-CM

## 2016-10-07 NOTE — Telephone Encounter (Signed)
-----   Message from Bridget Beck, [place] sent at 10/03/2016  1:47 PM EDT ----- Regarding: PT at [facility] PT for left foot pain, fasciitis, tendonitis Gait,Range of motion, strengthening, edema reduction, modailities like inotophoresis, astym, tens, as needed

## 2016-10-08 ENCOUNTER — Ambulatory Visit: Payer: 59 | Attending: Sports Medicine | Admitting: Physical Therapy

## 2016-10-08 ENCOUNTER — Encounter: Payer: Self-pay | Admitting: Physical Therapy

## 2016-10-08 DIAGNOSIS — M6281 Muscle weakness (generalized): Secondary | ICD-10-CM | POA: Insufficient documentation

## 2016-10-08 DIAGNOSIS — R262 Difficulty in walking, not elsewhere classified: Secondary | ICD-10-CM | POA: Insufficient documentation

## 2016-10-08 DIAGNOSIS — M79672 Pain in left foot: Secondary | ICD-10-CM | POA: Diagnosis not present

## 2016-10-08 NOTE — Therapy (Addendum)
[place] MAIN [facility] SERVICES [address] [place], [place], [address] Phone: [phone]   Fax:  [phone]  Physical Therapy Evaluation  Patient Details  Name: Bridget Beck MRN: [id number] Date of Birth: 03-Nov-1970 Referring Provider: Dr. Cannon Kettle   Encounter Date: 10/08/2016      PT End of Session - 10/08/16 1152    Visit Number 1   Number of Visits 13   Date for PT Re-Evaluation 11/19/16   PT Start Time 6789   PT Stop Time 1117   PT Time Calculation (min) 54 min   Activity Tolerance Patient tolerated treatment well;Patient limited by pain   Behavior During Therapy [facility] for tasks assessed/performed      Past Medical History:  Diagnosis Date  . Depression    Managed  . GERD (gastroesophageal reflux disease)   . Migraines    with aura    Past Surgical History:  Procedure Laterality Date  . CERVICAL FUSION     x 2  . INCONTINENCE SURGERY  2004    There were no vitals filed for this visit.       Subjective Assessment - 10/08/16 1024    Subjective Pt reports that her pain moves around a bit at times; pt reports "my walk is totally off, I move like a grandma". Pt wearing crocks due to fluctuating edema    Pertinent History 46 y/o female presents to therapy with chronic L foot pain. She has had foot pain since late in 2016 and the pain has fluctuated since then without resolution. Foot pain has been exacerbated by back pain. In March 2018 pt wore CAM boot for several months due to suspected calcaneal fracture. Pt has had 2 recent injections in the left foot for pain (second injection on 10/03/16) and has tried various NSAIDs and opiates without lasting improvement. Currently pt reports left foot pain is no better and continues to be an aggravating factor for her back pain due to abnormality of gait   Limitations Standing;Walking   How long can you sit comfortably? Unlimited   How long can you stand comfortably? foot goes to sleep.     How long can you walk comfortably? Pt unable to walk more than a few hundred feet    Diagnostic tests MR 06/14/16 Suspected stress fracture the calcaneus; CT 09/09/16 IMPRESSION: No acute osseous injury of the left foot.   Currently in Pain? Yes   Pain Score 6    Pain Location Foot   Pain Orientation Left   Pain Descriptors / Indicators Throbbing   Pain Type Chronic pain   Pain Onset More than a month ago   Pain Frequency Intermittent   Aggravating Factors  Standing walking   Pain Relieving Factors Sitting with LLE elevated   Effect of Pain on Daily Activities Decreased activity tolerance            [facility] PT Assessment - 10/09/16 0001      Assessment   Medical Diagnosis Plantar fasciitis   Referring Provider Dr. Cannon Kettle    Onset Date/Surgical Date --  Started in 2017   Hand Dominance Right   Next MD Visit 10/18/16   Prior Therapy No PT for foot, prior PT for back pain with some success.      Precautions   Precautions None     Restrictions   Weight Bearing Restrictions No     Balance Screen   Has the patient fallen in the past 6 months No  Has the patient had a decrease in activity level because of a fear of falling?  No   Is the patient reluctant to leave their home because of a fear of falling?  No     Home Environment   Living Environment Private residence   Living Arrangements Spouse/significant other;Children   Type of [place] to enter   Entrance Stairs-Number of Steps 4   Entrance Stairs-Rails Can reach both   [place] One level   [place] None     Prior Function   Level of Independence Independent   Vocation On disability  FMLA leave; pt plans to return to work    Education officer, museum; requires prolonged walking/standing     Cognition   Overall Cognitive Status Within Functional Limits for tasks assessed       PROM/AROM:  Upper Extremity: WFL  Lower extremity:  DF: AROM  R: 6 deg  L 6 deg  non-weght bearing     PF: Grossly WFL    STRENGTH:  Graded on a 0-5 scale Muscle Group Left Right  Gross Upper Extremity [facility]  Hip Flex 4+ 4+  Hip Abd 4+ 4+  Hip Add 4+ 4+          Knee Flex 4 4  Knee Ext 4 4+  Ankle DF 4 with pain within limited range 4+ within limited range       Sensory:  Light touch: Not impaired with gross assessment in BLEs  Coordination:   No gross abnormalities in LEs with functional assessment   Observations:   Posture: Sitting: slight forward shoulder and slumped posture, but no gross abnormality. In stance pt weight shifts to the R with minimal weight bearing on LLE. RLE is slightly hyperextended with flexed L knee in static stance.     Edema: Figure 8 ankle:  R:51cm L:52cm   Palpation: Pt had pain with palpation of medial and lateral plantar calcaneus at the insertion of the plantar fascia. Pt had mild tenderness at base of 5th metatarsal.    BALANCE:   Pt tolerates mod perturbation in standing, min perturbation in Romberg stance with increased sway and with minimal weight bearing on LLE. Pt able to maintain tandem stance with increased sway and SBA for safety for 10 sec without LOB with eyes opened and with eyes closed; still with majority of weight on RLE  Static Standing Balance: LEs unsupported  Normal Able to maintain standing balance against maximal resistance   Good Able to maintain standing balance against moderate resistance X  Good-/Fair+ Able to maintain standing balance against minimal resistance   Fair Able to stand unsupported without UE support and without LOB for 1-2 min   Fair- Requires Min A and UE support to maintain standing without loss of balance   Poor+ Requires mod A and UE support to maintain standing without loss of balance   Poor Requires max A and UE support to maintain standing balance without loss     Standing Dynamic Balance  Normal Stand independently unsupported, able to weight shift and cross midline  maximally   Good Stand independently unsupported, able to weight shift and cross midline moderately X  Good-/Fair+ Stand independently unsupported, able to weight shift across midline minimally   Fair Stand independently unsupported, weight shift, and reach ipsilaterally, loss of balance when crossing midline   Poor+ Able to stand with Min A and reach ipsilaterally, unable to weight shift  Poor+ Able to stand with Mod A and minimally reach ipsilaterally, unable to cross midline.      SPECIAL TESTS:     Windlass test: (+) left indicating plantar fascia irritation. Performed in weight bearing.   FUNCTIONAL MOBILITY:   Transfers: Pt is able to transfer sit> stand with min use of UEs though push-off from thighs. No evidence of LOB on initial standing. Pt relies heavily on RLE for sit to stand due to pain in L foot.    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                  Objective measurements completed on examination: See above findings.     Treatment:  Ther-ex:   Gastroc and soleus wall stretch 2 x 30 sec each with handout for HEP. Pt able to perform properly with min cueing for foot position and to avoid raising heel.    Towel pull for foot intrinsic strengthening with cues to avoid hip and knee movement and to use only foot intrinsic motion; Pt instructed to add this exercise to HEP           PT Education - 10/08/16 1152    Education provided Yes   Education Details Plan of care, ther-ex    Person(s)  Educated Patient   Methods Explanation;Demonstration;Tactile cues;Verbal cues;Handout   Comprehension Verbal cues required;Returned demonstration;Verbalized understanding;Tactile cues required          PT Short Term Goals - 10/08/16 1908      PT SHORT TERM GOAL #1   Title Pt's ankle DF AROM will increase by at least 4 deg to facilitate improved gait mechanics and balance for improved functional mobility   Baseline 6 degrees DF in BLE (norm is 15 to 20 deg)   Time 4   Period Weeks   Status New   Target Date 11/05/16     PT SHORT TERM GOAL #2   Title Patient will increase lower extremity functional scale to >31/80 to demonstrate improved functional mobility and increased tolerance with ADLs.    Baseline 22/80   Time 4   Period Weeks   Status New   Target Date 11/05/16           PT Long Term Goals - 10/08/16 1912      PT LONG TERM GOAL #1   Title Patient will be independent in home exercise program to improve strength/mobility for better functional independence with ADLs.   Time 6   Period Weeks   Status New   Target Date 11/19/16     PT LONG TERM GOAL #2   Title Patient will report a worst pain of 3/10 on VAS in L foot to improve tolerance with ADLs and reduced symptoms with activities.    Baseline 6/10 in standing currently  Time 6   Period Weeks   Status New   Target Date 11/19/16     PT LONG TERM GOAL #3   Title Patient will increase lower extremity functional scale to >47/80 to demonstrate improved functional mobility and increased tolerance with ADLs.    Baseline  22/80   Time 6   Period Weeks   Status New   Target Date 11/19/16     PT LONG TERM GOAL #4   Title Patient will increase 10 meter walk test to >1.82m/s as to improve gait speed for better community ambulation and to reduce fall risk.   Baseline 0.9m/s with no AD and SBA for safety   Time 6   Period Weeks   Status New   Target Date 11/19/16     PT LONG TERM GOAL #5   Title Pt's ankle DF  AROM will increase by at least 6 deg to facilitate improved gait mechanics and balance for improved functional mobility   Baseline 6 deg bilaterally in NWB position   Time 6   Period Weeks   Status New   Target Date 11/19/16     Additional Long Term Goals   Additional Long Term Goals Yes     PT LONG TERM GOAL #6   Title Pt will demonstrate improved gait mechanics with equal stride length and cadence bilaterally with erect posture in order to increased gait efficiency and prevent secondary dysfunction   Time 6   Period Weeks   Status New   Target Date 11/20/16                Plan - 10/08/16 1153    Clinical Impression Statement 46 y/o female presents to therapy with chronic L foot pain with referral for plantar fasciitis. Pt was evaluated with LEFS indicating pt has self-perceived severe impairment in LE function. Pt's gait speed is community level, though pt has abnormality of gait including decreased stance time on L, forward flexed posture, and decreased knee extension in stance on L. Windlass test is positive on L indicating irritation of the plantar fascia. Pt's strength was limited bilaterally, but slightly decreased on left compared to right. Pt wears crocs currently due to sneakers being too tight secondary to edema in L foot and ankle. Currently there is only a 1cm difference in figure 8 foot/ankle measurement. Pt reports she is interested in returning to work, which will require prolonged standing and walking. Pt is appropriate for skilled therapy at this time in order to maximize her functional mobility and decrease pain.   History and Personal Factors relevant to plan of care: (-) Hx of depression and chronic pain in multiple areas of the body, chronicity of deficits, multiple comorbidities (+) young in age, independent with transportation   Clinical Presentation Evolving   Clinical Presentation due to: Multiple interdependent impairments   Clinical Decision Making Moderate    Rehab Potential Good   PT Frequency 2x / week   PT Duration 6 weeks   PT Treatment/Interventions ADLs/Self Care Home Management;Iontophoresis 4mg /ml Dexamethasone;Electrical Stimulation;Biofeedback;Cryotherapy;Gait training;Stair training;Balance training;Therapeutic exercise;Therapeutic activities;Functional mobility training;Neuromuscular re-education;Patient/family education;Manual techniques;Energy conservation;Taping;Splinting;Passive range of motion;Ultrasound;Orthotic Fit/Training;Dry needling   PT Next Visit Plan Ther-ex for PF strengthening and stretching, manual for foot and calf   PT Home Exercise Plan Initiated with stretching   Consulted and Agree with Plan of Care Patient      Patient will benefit from skilled therapeutic intervention in order to improve the following deficits and impairments:  Abnormal gait, Decreased  activity tolerance, Decreased balance, Decreased range of motion, Decreased mobility, Decreased strength, Difficulty walking, Hypomobility, Increased edema, Impaired flexibility, Impaired perceived functional ability, Impaired sensation, Postural dysfunction, Improper body mechanics, Pain, Increased fascial restricitons  Visit Diagnosis: Pain in left foot - Plan: PT plan of care cert/re-cert  Muscle weakness (generalized) - Plan: PT plan of care cert/re-cert  Difficulty in walking, not elsewhere classified - Plan: PT plan of care cert/re-cert     Problem List Patient Active Problem List   Diagnosis Date Noted  . Colon cancer screening 05/08/2016  . Rectal bleeding 05/08/2016  . Achilles tendonitis 04/28/2016  . Bowel movement symptom 12/07/2014  . Intractable chronic migraine without aura and without status migrainosus 12/05/2014  . Weight gain 12/01/2013  . Left hip pain 12/01/2013  . Migraine without aura 04/20/2013  . Depression 04/26/2012  . Lumbar back pain with radiculopathy affecting left lower extremity 02/09/2012  . Memory changes 10/17/2011    Burnett Corrente, SPT This entire session was performed under direct supervision and direction of a licensed therapist/therapist assistant . I have personally read, edited and approve of the note as written.  Trotter,Margaret PT, DPT 10/09/2016, 3:16 PM   [place] MAIN [facility] SERVICES [address] [place], [place], [address] Phone: [phone]   Fax:  [phone]  Name: Bridget Beck MRN: [id number] Date of Birth: 07-Mar-1970

## 2016-10-08 NOTE — Patient Instructions (Addendum)
   http://gt2.exer.us/478   Copyright  VHI. All rights reserved.  Calf Stretch    Hands on wall, shoulder height, slightly wider than shoulders, fingers up. Left foot ahead of right. Body straight (like a board); lean into wall by bending front knee. Keep right heel on floor and foot straight ahead. Hold __30_ seconds. Push away with arms. Repeat _2__ times, 3 sets per day. Do on other leg.   Copyright  VHI. All rights reserved.  Calf Stretch (Soleus)    Gently bend knees slightly and move one foot back. Lean into wall so that stretch is felt in back lower calf. Hold __30__ seconds. Repeat with other leg back. Repeat ___2_ times. Do _3___ sessions per day.  http://gt2.exer.us/418   Copyright  VHI. All rights reserved.

## 2016-10-15 ENCOUNTER — Ambulatory Visit: Payer: 59 | Admitting: Physical Therapy

## 2016-10-15 ENCOUNTER — Encounter: Payer: Self-pay | Admitting: Physical Therapy

## 2016-10-15 DIAGNOSIS — R262 Difficulty in walking, not elsewhere classified: Secondary | ICD-10-CM

## 2016-10-15 DIAGNOSIS — M6281 Muscle weakness (generalized): Secondary | ICD-10-CM

## 2016-10-15 DIAGNOSIS — M79672 Pain in left foot: Secondary | ICD-10-CM | POA: Diagnosis not present

## 2016-10-15 NOTE — Patient Instructions (Addendum)
Importance of wearing supportive shoes  Orthotic inserts for arch support  Not walking barefoot   Water bottle icing every hour when possible, before standing, after doing standing activity, ice after stretching  Stretch often 3-5 times per day, stretch before standing and after activity,   Wear shoes with a backing and arch support; example: Profoot plantar fasciitis insert Foot Arch Stretch    Place foot on top of a bat and gently roll forward and backward feeling an arch massage and stretch. Different areas of bat can be used to change feel and depth of massage. (Can use rolling pin, bottle, etc.) Start with _1-2___ minutes. Do _3-5___ sessions per day. CAUTION: Not recommended for the insensitive foot.  Copyright  VHI. All rights reserved.  SCI SELF STRETCH: Toe Flexion / Extension    Bend hip and knee. Stretch toes forward. Hold 30 - 60___ seconds. Repeat backward. __2_ reps per set, _3-5__ sets per day, __7_ days per week Place other hand on lower leg.  Copyright  VHI. All rights reserved.

## 2016-10-15 NOTE — Therapy (Signed)
[place] MAIN [facility] SERVICES [address] [place], [place], [address] Phone: [phone]   Fax:  [phone]  Physical Therapy Treatment  Patient Details  Name: Bridget Beck MRN: [id number] Date of Birth: 02-08-1970 Referring Provider: Dr. Cannon Kettle   Encounter Date: 10/15/2016      PT End of Session - 10/15/16 1208    Visit Number 2   Number of Visits 13   Date for PT Re-Evaluation 11/19/16   PT Start Time 7026   PT Stop Time 1114   PT Time Calculation (min) 45 min   Activity Tolerance Patient tolerated treatment well;Patient limited by pain   Behavior During Therapy [facility] for tasks assessed/performed      Past Medical History:  Diagnosis Date  . Depression    Managed  . GERD (gastroesophageal reflux disease)   . Migraines    with aura    Past Surgical History:  Procedure Laterality Date  . CERVICAL FUSION     x 2  . INCONTINENCE SURGERY  2004    There were no vitals filed for this visit.      Subjective Assessment - 10/15/16 1053    Subjective Pt reports that her pain is no better or worse than before. She reports she has had increased back pain lately. Pt has been performing stretches for HEP without issue.   Pertinent History 46 y/o female presents to therapy with chronic L foot pain. She has had foot pain since late in 2016 and the pain has fluctuated since then without resolution. Foot pain has been exacerbated by back pain. In March 2018 pt wore CAM boot for several months due to suspected calcaneal fracture. Pt has had 2 recent injections in the left foot for pain (second injection on 10/03/16) and has tried various NSAIDs and opiates without lasting improvement. Currently pt reports left foot pain is no better and continues to be an aggravating factor for her back pain due to abnormality of gait   Limitations Standing;Walking   How long can you sit comfortably? Unlimited   How long can you stand comfortably? foot goes to  sleep.    How long can you walk comfortably? Pt unable to walk more than a few hundred feet    Diagnostic tests MR 06/14/16 Suspected stress fracture the calcaneus; CT 09/09/16 IMPRESSION: No acute osseous injury of the left foot.   Currently in Pain? Yes   Pain Score 3   it varies   Pain Location Back   Pain Orientation Lower   Pain Descriptors / Indicators Aching   Pain Type Chronic pain   Pain Onset More than a month ago   Pain Frequency Intermittent   Aggravating Factors  Standing walking   Pain Relieving Factors Sitting, resting   Effect of Pain on Daily Activities Decreased activity tolerance    Multiple Pain Sites Yes   Pain Score 3   Pain Location Foot   Pain Orientation Left   Pain Descriptors / Indicators Aching   Pain Type Chronic pain   Pain Onset More than a month ago   Pain Frequency Intermittent   Aggravating Factors  Standing, walking   Pain Relieving Factors Sitting, stretching   Effect of Pain on Daily Activities Limited standing activity       Treatment:   Pt educated on wearing supportive shoes, not walking barefoot   Water bottle icing every hour when possible, before standing, after doing standing activity, ice after stretching, stretch often 3-5  times per day, stretch before standing and after activity, wearingg shoes with a backing and arch support, use of inserts;   Pt reports she has custom orthotics but is not wearing them currently.    Instructed pt in sitting foot/toe stretch; caused LBP with leg crossing over other leg, instructed pt to use a ball to stretch in sitting while driving heel towards to ground.    Manual therapy:   Soft tissue mobilization of triceps surae with trigger point release 8-10 min; pt with multiple painful bands, especially in lateral gastroc. Pt had increased tolerance and decreased tight muscle bands with continued manual trigger point release.   Soft tissue mobilization to plantar foot in sitting x 5-6 min with focus on  plantar fascia working from great toe down to insertion on calcaneus while in light DF stretch; pt initially with severe pain that rapidly decreased with continued mobilization.   Pt educated on performing similar mobilization at home with assist from spouse while in light DF stretch.   Lateral plantar heel soft tissue mobilization; pt with calcification and tissue tightness around lateral calcaneus; pt initially with discomfort that decreased with continued mobilization.   Ther-ex:  Sitting:  Re-instruct pt in seated foot stretch in DF to stretch plantar fascia 2 x 30 sec; pt with backpain while attempting to stretch foot flexors in cross leg sitting; instructions to perform with small ball under toes and ball of foot while driving heel towards ground; Pt able to perform with correct technique and no LBP.  Standing:   Gastroc stretch in standing 2 x 30 sec   Soleus stretch in standing 2 x 30 sec  Pt instructed on foot placement and use of bolster under ball of foot with soleus stretch. Pt able to perform correctly with min cueing.          PT Education - 10/15/16 1207    Education provided Yes   Education Details Stretching, manual therapy techniques for plantar foot, management techniques including icing, footwear, orthotics,    Person(s) Educated Patient   Methods Explanation;Demonstration;Tactile cues;Verbal cues   Comprehension Verbal cues required;Tactile cues required;Returned demonstration;Verbalized understanding          PT Short Term Goals - 10/08/16 1908      PT SHORT TERM GOAL #1   Title Pt's ankle DF AROM will increase by at least 4 deg to facilitate improved gait mechanics and balance for improved functional mobility   Baseline 6 degrees DF in BLE (norm is 15 to 20 deg)   Time 4   Period Weeks   Status New   Target Date 11/05/16     PT SHORT TERM GOAL #2   Title Patient will increase lower extremity functional scale to >31/80 to demonstrate improved  functional mobility and increased tolerance with ADLs.    Baseline 22/80   Time 4   Period Weeks   Status New   Target Date 11/05/16           PT Long Term Goals - 10/08/16 1912      PT LONG TERM GOAL #1   Title Patient will be independent in home exercise program to improve strength/mobility for better functional independence with ADLs.   Time 6   Period Weeks   Status New   Target Date 11/19/16     PT LONG TERM GOAL #2   Title Patient will report a worst pain of 3/10 on VAS in L foot to improve tolerance with ADLs and reduced symptoms  with activities.    Baseline 6/10 in standing currently   Time 6   Period Weeks   Status New   Target Date 11/19/16     PT LONG TERM GOAL #3   Title Patient will increase lower extremity functional scale to >47/80 to demonstrate improved functional mobility and increased tolerance with ADLs.    Baseline  22/80   Time 6   Period Weeks   Status New   Target Date 11/19/16     PT LONG TERM GOAL #4   Title Patient will increase 10 meter walk test to >1.27m/s as to improve gait speed for better community ambulation and to reduce fall risk.   Baseline 0.56m/s with no AD and SBA for safety   Time 6   Period Weeks   Status New   Target Date 11/19/16     PT LONG TERM GOAL #5   Title Pt's ankle DF AROM will increase by at least 6 deg to facilitate improved gait mechanics and balance for improved functional mobility   Baseline 6 deg bilaterally in NWB position   Time 6   Period Weeks   Status New   Target Date 11/19/16     Additional Long Term Goals   Additional Long Term Goals Yes     PT LONG TERM GOAL #6   Title Pt will demonstrate improved gait mechanics with equal stride length and cadence bilaterally with erect posture in order to increased gait efficiency and prevent secondary dysfunction   Time 6   Period Weeks   Status New   Target Date 11/20/16               Plan - 10/15/16 1210    Clinical Impression Statement Pt  instructed in stretching for plantar foot and gastroc/soleus and manual therapy techniques for plantar foot. Pt educated in icing, footwear, and orthotic use. Pt has pain with manual therapy with multiple tight muscle bands in gastroc/soleus, especially in lateral gastroc. Pt had severe pain with palpation of achilles tendon on L and at achilles insertion on calcaneus. Pt reports this pain is why she does not wear full backed shoes. Pt will benefit from continued therapy including manual therapy and advancement through stretching and ther-ex to maximize her functional mobility and decrease her pain.    Rehab Potential Good   PT Frequency 2x / week   PT Duration 6 weeks   PT Treatment/Interventions ADLs/Self Care Home Management;Iontophoresis 4mg /ml Dexamethasone;Electrical Stimulation;Biofeedback;Cryotherapy;Gait training;Stair training;Balance training;Therapeutic exercise;Therapeutic activities;Functional mobility training;Neuromuscular re-education;Patient/family education;Manual techniques;Energy conservation;Taping;Splinting;Passive range of motion;Ultrasound;Orthotic Fit/Training;Dry needling   PT Next Visit Plan Ther-ex for PF strengthening and stretching, manual for foot and calf   PT Home Exercise Plan Initiated with stretching   Consulted and Agree with Plan of Care Patient      Patient will benefit from skilled therapeutic intervention in order to improve the following deficits and impairments:  Abnormal gait, Decreased activity tolerance, Decreased balance, Decreased range of motion, Decreased mobility, Decreased strength, Difficulty walking, Hypomobility, Increased edema, Impaired flexibility, Impaired perceived functional ability, Impaired sensation, Postural dysfunction, Improper body mechanics, Pain, Increased fascial restricitons  Visit Diagnosis: Pain in left foot  Muscle weakness (generalized)  Difficulty in walking, not elsewhere classified     Problem List Patient Active  Problem List   Diagnosis Date Noted  . Colon cancer screening 05/08/2016  . Rectal bleeding 05/08/2016  . Achilles tendonitis 04/28/2016  . Bowel movement symptom 12/07/2014  . Intractable chronic migraine without aura and without  status migrainosus 12/05/2014  . Weight gain 12/01/2013  . Left hip pain 12/01/2013  . Migraine without aura 04/20/2013  . Depression 04/26/2012  . Lumbar back pain with radiculopathy affecting left lower extremity 02/09/2012  . Memory changes 10/17/2011   Burnett Corrente, SPT This entire session was performed under direct supervision and direction of a licensed therapist/therapist assistant . I have personally read, edited and approve of the note as written.   Trotter,Margaret  PT, DPT 10/15/2016, 1:11 PM  [place] MAIN [facility] SERVICES [address] [place], [place], [address] Phone: [phone]   Fax:  [phone]  Name: Bridget Beck MRN: [id number] Date of Birth: 05-28-1970

## 2016-10-17 ENCOUNTER — Ambulatory Visit: Payer: 59 | Admitting: Physical Therapy

## 2016-10-17 ENCOUNTER — Encounter: Payer: Self-pay | Admitting: Physical Therapy

## 2016-10-17 DIAGNOSIS — M6281 Muscle weakness (generalized): Secondary | ICD-10-CM | POA: Diagnosis not present

## 2016-10-17 DIAGNOSIS — M79672 Pain in left foot: Secondary | ICD-10-CM | POA: Diagnosis not present

## 2016-10-17 DIAGNOSIS — R262 Difficulty in walking, not elsewhere classified: Secondary | ICD-10-CM

## 2016-10-17 NOTE — Therapy (Signed)
[place] MAIN [facility] SERVICES [address] [place], [place], [address] Phone: [phone]   Fax:  [phone]  Physical Therapy Treatment  Patient Details  Name: Bridget Beck MRN: [id number] Date of Birth: 07/01/1970 Referring Provider: Dr. Cannon Kettle   Encounter Date: 10/17/2016      PT End of Session - 10/17/16 1500    Visit Number 3   Number of Visits 13   Date for PT Re-Evaluation 11/19/16   PT Start Time 1400   PT Stop Time 1430   PT Time Calculation (min) 30 min   Activity Tolerance Patient tolerated treatment well;No increased pain   Behavior During Therapy WFL for tasks assessed/performed      Past Medical History:  Diagnosis Date  . Depression    Managed  . GERD (gastroesophageal reflux disease)   . Migraines    with aura    Past Surgical History:  Procedure Laterality Date  . CERVICAL FUSION     x 2  . INCONTINENCE SURGERY  2004    There were no vitals filed for this visit.      Subjective Assessment - 10/17/16 1405    Subjective Patient reports still feeling sore. She presents to therapy with tennis shoes. She reports that she is trying to be compliant and has been doing her exercises. She reports that it hurts when wearing shoes all day;    Pertinent History 46 y/o female presents to therapy with chronic L foot pain. She has had foot pain since late in 2016 and the pain has fluctuated since then without resolution. Foot pain has been exacerbated by back pain. In March 2018 pt wore CAM boot for several months due to suspected calcaneal fracture. Pt has had 2 recent injections in the left foot for pain (second injection on 10/03/16) and has tried various NSAIDs and opiates without lasting improvement. Currently pt reports left foot pain is no better and continues to be an aggravating factor for her back pain due to abnormality of gait   Limitations Standing;Walking   How long can you sit comfortably? Unlimited   How  long can you stand comfortably? foot goes to sleep.    How long can you walk comfortably? Pt unable to walk more than a few hundred feet    Diagnostic tests MR 06/14/16 Suspected stress fracture the calcaneus; CT 09/09/16 IMPRESSION: No acute osseous injury of the left foot.   Currently in Pain? Yes   Pain Score 6    Pain Location Foot   Pain Orientation Left   Pain Descriptors / Indicators Aching;Burning;Sore;Tender   Pain Type Chronic pain   Pain Onset More than a month ago   Pain Frequency Intermittent   Aggravating Factors  standing/walking   Pain Relieving Factors sitting/rest/heat   Effect of Pain on Daily Activities decreased activity tolerance;    Multiple Pain Sites Yes   Pain Score 6   Pain Location Back   Pain Orientation Lower;Left   Pain Descriptors / Indicators Aching;Sore   Pain Type Chronic pain   Pain Onset More than a month ago   Pain Frequency Intermittent   Aggravating Factors  standing/walking   Pain Relieving Factors sitting, rest/heat           TREATMENT: PT had patient sit on table (long sitting) with sock/shoe removed from left foot;  PT performed passive ankle DF stretch with great toe extension to increase plantar fascia stretch 30 sec hold x3 reps; PT perform soft/deep  tissue massage including cross friction using edge tool to facilitate better tissue extensibility and reduce pain along left plantar fascia x20 min; Patient reports increased tenderness along medial plantar fascia with increased tightness noted with stretch applied to foot with toe extension. Patient responded well to manual therapy with decreased tenderness along arch and medial heel of LLE. However upon standing, she reports continued posterior heel discomfort. Patient educated on importance of ice/stretch to reduce inflammation;  Sitting LLE ankle AROM: DF: -10 from neutral; passive ROM: 20 degrees of DF;   PT instructed patient in sitting green tband ankle DF x10 reps bilaterally  to increase strength in LLE ankle for better ankle DF AROM. Patient required min Vcs for positioning to improve strengthening;                      PT Education - 10/17/16 1459    Education provided Yes   Education Details HEP reinforced/advanced; manual therapy   Person(s) Educated Patient   Methods Explanation;Demonstration;Verbal cues   Comprehension Verbalized understanding;Returned demonstration;Verbal cues required;Need further instruction          PT Short Term Goals - 10/08/16 1908      PT SHORT TERM GOAL #1   Title Pt's ankle DF AROM will increase by at least 4 deg to facilitate improved gait mechanics and balance for improved functional mobility   Baseline 6 degrees DF in BLE (norm is 15 to 20 deg)   Time 4   Period Weeks   Status New   Target Date 11/05/16     PT SHORT TERM GOAL #2   Title Patient will increase lower extremity functional scale to >31/80 to demonstrate improved functional mobility and increased tolerance with ADLs.    Baseline 22/80   Time 4   Period Weeks   Status New   Target Date 11/05/16           PT Long Term Goals - 10/08/16 1912      PT LONG TERM GOAL #1   Title Patient will be independent in home exercise program to improve strength/mobility for better functional independence with ADLs.   Time 6   Period Weeks   Status New   Target Date 11/19/16     PT LONG TERM GOAL #2   Title Patient will report a worst pain of 3/10 on VAS in L foot to improve tolerance with ADLs and reduced symptoms with activities.    Baseline 6/10 in standing currently   Time 6   Period Weeks   Status New   Target Date 11/19/16     PT LONG TERM GOAL #3   Title Patient will increase lower extremity functional scale to >47/80 to demonstrate improved functional mobility and increased tolerance with ADLs.    Baseline  22/80   Time 6   Period Weeks   Status New   Target Date 11/19/16     PT LONG TERM GOAL #4   Title Patient will increase  10 meter walk test to >1.5m/s as to improve gait speed for better community ambulation and to reduce fall risk.   Baseline 0.38m/s with no AD and SBA for safety   Time 6   Period Weeks   Status New   Target Date 11/19/16     PT LONG TERM GOAL #5   Title Pt's ankle DF AROM will increase by at least 6 deg to facilitate improved gait mechanics and balance for improved functional mobility   Baseline  6 deg bilaterally in NWB position   Time 6   Period Weeks   Status New   Target Date 11/19/16     Additional Long Term Goals   Additional Long Term Goals Yes     PT LONG TERM GOAL #6   Title Pt will demonstrate improved gait mechanics with equal stride length and cadence bilaterally with erect posture in order to increased gait efficiency and prevent secondary dysfunction   Time 6   Period Weeks   Status New   Target Date 11/20/16               Plan - 10/17/16 1500    Clinical Impression Statement PT performed extensive manual therapy to patient's left foot including crossfriction, soft/deep tissue massage to left plantar fascia. Patient reports increased tightness with palpation to plantar fascia especially when its on stretch. She demonstrates decreased LLE ankle AROM, however has full passive ROM. Patient instructed in LLE ankle strengthening to facilitate better ankle DF ROM. HEP was reinforced with instruction to increased ice and stretch to LEs to reduce pain. She reports less pain at end of session but does report, "My foot feels fat" at end of session. Concerned about various sensory deficits including feeling of "fullness/swelling" and if that is related to back pain;  patient would benefit from additional skilled PT intervention to improve strength balance and gait safety;    Rehab Potential Good   PT Frequency 2x / week   PT Duration 6 weeks   PT Treatment/Interventions ADLs/Self Care Home Management;Iontophoresis 4mg /ml Dexamethasone;Electrical  Stimulation;Biofeedback;Cryotherapy;Gait training;Stair training;Balance training;Therapeutic exercise;Therapeutic activities;Functional mobility training;Neuromuscular re-education;Patient/family education;Manual techniques;Energy conservation;Taping;Splinting;Passive range of motion;Ultrasound;Orthotic Fit/Training;Dry needling   PT Next Visit Plan manual for foot and calf, exercise modalities   PT Home Exercise Plan advanced with sitting DF strengthening;    Consulted and Agree with Plan of Care Patient      Patient will benefit from skilled therapeutic intervention in order to improve the following deficits and impairments:  Abnormal gait, Decreased activity tolerance, Decreased balance, Decreased range of motion, Decreased mobility, Decreased strength, Difficulty walking, Hypomobility, Increased edema, Impaired flexibility, Impaired perceived functional ability, Impaired sensation, Postural dysfunction, Improper body mechanics, Pain, Increased fascial restricitons  Visit Diagnosis: Pain in left foot  Muscle weakness (generalized)  Difficulty in walking, not elsewhere classified     Problem List Patient Active Problem List   Diagnosis Date Noted  . Colon cancer screening 05/08/2016  . Rectal bleeding 05/08/2016  . Achilles tendonitis 04/28/2016  . Bowel movement symptom 12/07/2014  . Intractable chronic migraine without aura and without status migrainosus 12/05/2014  . Weight gain 12/01/2013  . Left hip pain 12/01/2013  . Migraine without aura 04/20/2013  . Depression 04/26/2012  . Lumbar back pain with radiculopathy affecting left lower extremity 02/09/2012  . Memory changes 10/17/2011    Lynessa Almanzar PT, DPT 10/17/2016, 3:04 PM  [place] MAIN [facility] SERVICES [address] [place], [place], [address] Phone: [phone]   Fax:  [phone]  Name: Bridget Beck MRN: [id number] Date of Birth: [birth year]

## 2016-10-17 NOTE — Patient Instructions (Signed)
   Copyright  VHI. All rights reserved.  FLEXION: Sitting - Resistance Band (Active)   Sit with right foot flat. Have band tied around both feet, bend ankle, bringing toes toward head. Complete __2_ sets of __10_ repetitions. Perform _2__ sessions per day.

## 2016-10-18 ENCOUNTER — Ambulatory Visit (INDEPENDENT_AMBULATORY_CARE_PROVIDER_SITE_OTHER): Payer: 59 | Admitting: Neurology

## 2016-10-18 ENCOUNTER — Ambulatory Visit (INDEPENDENT_AMBULATORY_CARE_PROVIDER_SITE_OTHER): Payer: 59 | Admitting: Sports Medicine

## 2016-10-18 DIAGNOSIS — M779 Enthesopathy, unspecified: Secondary | ICD-10-CM | POA: Diagnosis not present

## 2016-10-18 DIAGNOSIS — R262 Difficulty in walking, not elsewhere classified: Secondary | ICD-10-CM

## 2016-10-18 DIAGNOSIS — M79672 Pain in left foot: Secondary | ICD-10-CM | POA: Diagnosis not present

## 2016-10-18 DIAGNOSIS — G43009 Migraine without aura, not intractable, without status migrainosus: Secondary | ICD-10-CM

## 2016-10-18 DIAGNOSIS — M722 Plantar fascial fibromatosis: Secondary | ICD-10-CM

## 2016-10-18 MED ORDER — PREDNISONE 10 MG (21) PO TBPK: ORAL_TABLET | ORAL | 0 refills | Status: DC

## 2016-10-18 MED ORDER — DICLOFENAC SODIUM 75 MG PO TBEC: 75.0000 mg | DELAYED_RELEASE_TABLET | Freq: Two times a day (BID) | ORAL | 0 refills | Status: DC

## 2016-10-18 MED ORDER — ONABOTULINUMTOXINA 100 UNITS IJ SOLR
155.0000 [IU] | Freq: Once | INTRAMUSCULAR | Status: AC
  Administered 2016-10-18: 155 [IU] via INTRAMUSCULAR

## 2016-10-18 NOTE — Progress Notes (Signed)
[facility]   Procedure Note Botox  Attending: Dr. Metta Clines  Preoperative Diagnosis(es): Chronic migraine  Consent obtained from: The patient Benefits discussed included, but were not limited to decreased muscle tightness, increased joint range of motion, and decreased pain.  Risk discussed included, but were not limited pain and discomfort, bleeding, bruising, excessive weakness, venous thrombosis, muscle atrophy and dysphagia.  Anticipated outcomes of the procedure as well as he risks and benefits of the alternatives to the procedure, and the roles and tasks of the personnel to be involved, were discussed with the patient, and the patient consents to the procedure and agrees to proceed. A copy of the patient medication guide was given to the patient which explains the blackbox warning.  Patients identity and treatment sites confirmed Yes.  .  Details of Procedure: Skin was cleaned with alcohol. Prior to injection, the needle plunger was aspirated to make sure the needle was not within a blood vessel.  There was no blood retrieved on aspiration.    Following is a summary of the muscles injected  And the amount of Botulinum toxin used:  Dilution 200 units of Botox was reconstituted with 4 ml of preservative free normal saline. Time of reconstitution: At the time of the office visit (<30 minutes prior to injection)   Injections  155 total units of Botox was injected with a 30 gauge needle.  Injection Sites: L occipitalis: 15 units- 3 sites  R occiptalis: 15 units- 3 sites  L upper trapezius: 15 units- 3 sites R upper trapezius: 15 units- 3 sits          L paraspinal: 10 units- 2 sites R paraspinal: 10 units- 2 sites  Face L frontalis(2 injection sites):10 units   R frontalis(2 injection sites):10 units         L corrugator: 5 units   R corrugator: 5 units           Procerus: 5 units   L temporalis: 20 units R temporalis: 20 units   Agent:  200 units of botulinum Type  A (Onobotulinum Toxin type A) was reconstituted with 4 ml of preservative free normal saline.  Time of reconstitution: At the time of the office visit (<30 minutes prior to injection)     Total injected (Units): 155  Total wasted (Units): 15  Patient tolerated procedure well without complications.   Reinjection is anticipated in 3 months. Return to clinic in 4 months

## 2016-10-19 NOTE — Progress Notes (Signed)
Subjective: Bridget Beck is a 46 y.o. female patient returns to office for follow-up evaluation of heel pain on the left, states that pain is now about 6/10. Pain is sometimes worse after PT especially with deep soft tissue manipulation. States that her foot feels like a cement block. Reports that her back is hurting still and now she has pain in her hips. Reports that the topical pain cream and the pain patch helps some. Reports swelling and abnormal feeling to left foot with difficulty fitting shoes. Reports that her left shoe feels tighter than her right. Denies any other constitutional symptoms. No other issues noted.   Patient Active Problem List   Diagnosis Date Noted  . Colon cancer screening 05/08/2016  . Rectal bleeding 05/08/2016  . Achilles tendonitis 04/28/2016  . Bowel movement symptom 12/07/2014  . Intractable chronic migraine without aura and without status migrainosus 12/05/2014  . Weight gain 12/01/2013  . Left hip pain 12/01/2013  . Migraine without aura 04/20/2013  . Depression 04/26/2012  . Lumbar back pain with radiculopathy affecting left lower extremity 02/09/2012  . Memory changes 10/17/2011    Current Outpatient Prescriptions on File Prior to Visit  Medication Sig Dispense Refill  . gabapentin (NEURONTIN) 300 MG capsule Take 1 capsule (300 mg total) by mouth at bedtime. (Patient taking differently: Take 300 mg by mouth 4 (four) times daily. 1 in the morning, 1 in the afternoon, 2 in the evening) 90 capsule 1  . lidocaine (LIDODERM) 5 % Place 1 patch onto the skin daily. Remove & Discard patch within 12 hours or as directed by MD 30 patch 0  . meloxicam (MOBIC) 15 MG tablet Take 1 tablet (15 mg total) by mouth daily. 30 tablet 0  . naproxen (NAPROSYN) 500 MG tablet Take 1 tablet with sumatriptan tablet 20 tablet 2  . promethazine (PHENERGAN) 25 MG tablet Take 1 tablet (25 mg total) by mouth every 6 (six) hours as needed for nausea. 40 tablet 3  . SUMAtriptan  (IMITREX) 100 MG tablet Take 1 tablet (100 mg total) by mouth as directed. May repeat in 2 hours if headache persists or recurs. 10 tablet 5  . SUMAtriptan Succinate 6 MG/0.5ML SOCT Inject 6 Mg into skin at earliest onset of migraine, may repeat in 1 hour if headache persists, or reoccurs 6 mL 5  . topiramate (TOPAMAX) 25 MG tablet Take 3 tablets (75 mg total) by mouth at bedtime. 270 tablet 3  . traMADol (ULTRAM) 50 MG tablet Take 1 tablet (50 mg total) by mouth every 6 (six) hours as needed. 20 tablet 0  . valACYclovir (VALTREX) 1000 MG tablet Take 2 tablets (2,000 mg total) by mouth 2 (two) times daily. For 1 day.  Repeat as needed with future episodes. 8 tablet 1   Current Facility-Administered Medications on File Prior to Visit  Medication Dose Route Frequency Provider Last Rate Last Dose  . 0.9 %  sodium chloride infusion  500 mL Intravenous Continuous [name], [name], MD      . triamcinolone acetonide (KENALOG-40) injection 20 mg  20 mg Other Once [name], [name]        Allergies  Allergen Reactions  . Amitriptyline Other (See Comments)    Had severe migraine and pt could not see for 2 hours.  [name] [Pregabalin]     Migraine  . Methocarbamol Other (See Comments)    Stomach upset  . Prozac [Fluoxetine Hcl]     Fatigue   . Remeron [Mirtazapine]     "  felt like a zombie"    Objective: Physical Exam General: The patient is alert and oriented x3 in no acute distress.  Dermatology: Skin is dry and supple bilateral lower extremities. Nails 1-10 are normal. There is no erythema, no eccymosis, no open lesions present. Integument is otherwise unremarkable.  Vascular: Dorsalis Pedis pulse 2/4 and Posterior Tibial pulse are 1/4 bilateral. Capillary fill time is immediate to all digits. There are varicosities with mild decreased temperature gradient is on left.  Neurological: Grossly intact to light touch bilateral.  Musculoskeletal: Tenderness to palpation at the medial greater  than lateral calcaneal tubercale and through the insertion of the plantar fascia on the left foot with moderate intensity at the medial aspect extending into arch. There is minimal tenderness at the insertion of the Achilles on left. Minimal tenderness along the anterior ankle tendons on left. No pain with compression of calcaneus on left. No pain with calf compression on left. There is decreased Ankle joint range of motion bilateral with guarding on the left due to pain. All other joints range of motion within normal limits bilateral, however, mild guarding on left. Strength 5/5 in all groups bilateral.   Gait: Apropulsive   Assessment and Plan: Problem List Items Addressed This Visit    None    Visit Diagnoses    Inflammatory heel pain, left    -  Primary   Relevant Medications   predniSONE (STERAPRED UNI-PAK 21 TAB) 10 MG (21) TBPK tablet   diclofenac (VOLTAREN) 75 MG EC tablet   Plantar fasciitis, left       Tendonitis       Foot pain, left       Difficulty walking         -Complete examination performed.  -Re-discussed with patient in detail the condition of inflammatory heel pain/ plantar fasciitis with compensation tendinitis, how this occurs and general treatment options. -Rx Prednisone dose pack and Diclofenac to take as instructed  -Continue Lidoderm patch for pain -Continue with physical therapy with additional treatment modalities for edema reduction range of motion, gait stability and strengthening; will suggest iontophoresis and dry needling and less frequent deep tissue manipulation  -Advised patient to ice 1-2 times daily as instructed previously -Continue with good supportive tennis shoes  -Continue with night splint to use on left as previously instructed  -Discussed with patient if back hurts to have it re-evaluated and to also follow up with Ortho about hips -Projected return to work date 11/12/2016, pending clinical course; [employer] paperwork completed  -Patient to  return to office in 2 weeks for follow-up evaluation or sooner if problems or questions arise.  [name], [name]

## 2016-10-23 ENCOUNTER — Ambulatory Visit: Payer: 59

## 2016-10-23 DIAGNOSIS — R262 Difficulty in walking, not elsewhere classified: Secondary | ICD-10-CM

## 2016-10-23 DIAGNOSIS — M79672 Pain in left foot: Secondary | ICD-10-CM | POA: Diagnosis not present

## 2016-10-23 DIAGNOSIS — M6281 Muscle weakness (generalized): Secondary | ICD-10-CM | POA: Diagnosis not present

## 2016-10-23 NOTE — Therapy (Signed)
[place] MAIN [facility] SERVICES [address] [place], [place], [address] Phone: [phone]   Fax:  [phone]  Physical Therapy Treatment  Patient Details  Name: Bridget Beck MRN: [id number] Date of Birth: 04/18/1970 Referring Provider: Dr. Cannon Kettle   Encounter Date: 10/23/2016      PT End of Session - 10/23/16 1436    Visit Number 4   Number of Visits 13   Date for PT Re-Evaluation 11/19/16   PT Start Time 3009   PT Stop Time 1515   PT Time Calculation (min) 42 min   Equipment Utilized During Treatment Gait belt   Activity Tolerance Patient tolerated treatment well;No increased pain   Behavior During Therapy WFL for tasks assessed/performed      Past Medical History:  Diagnosis Date  . Depression    Managed  . GERD (gastroesophageal reflux disease)   . Migraines    with aura    Past Surgical History:  Procedure Laterality Date  . CERVICAL FUSION     x 2  . INCONTINENCE SURGERY  2004    There were no vitals filed for this visit.      Subjective Assessment - 10/23/16 1435    Subjective Pt states that she is having increased pain today in L plantar fascia. She was very poor after the last therapy session. She states that in general she has felt unwell this week as well and was very tired yesterday.    Pertinent History 46 y/o female presents to therapy with chronic L foot pain. She has had foot pain since late in 2016 and the pain has fluctuated since then without resolution. Foot pain has been exacerbated by back pain. In March 2018 pt wore CAM boot for several months due to suspected calcaneal fracture. Pt has had 2 recent injections in the left foot for pain (second injection on 10/03/16) and has tried various NSAIDs and opiates without lasting improvement. Currently pt reports left foot pain is no better and continues to be an aggravating factor for her back pain due to abnormality of gait   Limitations Standing;Walking   How  long can you sit comfortably? Unlimited   How long can you stand comfortably? foot goes to sleep.    How long can you walk comfortably? Pt unable to walk more than a few hundred feet    Diagnostic tests MR 06/14/16 Suspected stress fracture the calcaneus; CT 09/09/16 IMPRESSION: No acute osseous injury of the left foot.   Currently in Pain? Yes   Pain Score 8    Pain Location Foot   Pain Orientation Left   Pain Descriptors / Indicators Aching;Sore;Tender   Pain Type Chronic pain   Pain Onset More than a month ago         TREATMENT  Manual Therapy Supine L ankle DF manual stretch with straight knee 30s hold x 6 bouts; PROM L ankle DF, PF, and circumduction; L ankle AP mobs at end range DF grade I-II, 30s/bout x 5 bouts; Gentle STM to peroneus longus/brevis tendons above lateral malleolus as well as gastrocsoleus complex, pt is very tender with light palpation of L foot, ankle, and calf;  Ther-ex L ankle isometrics for DF, PF, inversion, and eversion 10s hold x 5 each; AROM L ankle for PF and DF;  Ultrasound Ultrasound to L plantarfascia, 3.3 Mhz, continuous, 1 W/cm2, with warming head on x 8 minutes;  PT Education - 10/23/16 1436    Education provided Yes   Education Details Plan of care   Person(s) Educated Patient   Methods Explanation   Comprehension Verbalized understanding          PT Short Term Goals - 10/08/16 1908      PT SHORT TERM GOAL #1   Title Pt's ankle DF AROM will increase by at least 4 deg to facilitate improved gait mechanics and balance for improved functional mobility   Baseline 6 degrees DF in BLE (norm is 15 to 20 deg)   Time 4   Period Weeks   Status New   Target Date 11/05/16     PT SHORT TERM GOAL #2   Title Patient will increase lower extremity functional scale to >31/80 to demonstrate improved functional mobility and increased tolerance with ADLs.    Baseline 22/80   Time 4   Period Weeks    Status New   Target Date 11/05/16           PT Long Term Goals - 10/08/16 1912      PT LONG TERM GOAL #1   Title Patient will be independent in home exercise program to improve strength/mobility for better functional independence with ADLs.   Time 6   Period Weeks   Status New   Target Date 11/19/16     PT LONG TERM GOAL #2   Title Patient will report a worst pain of 3/10 on VAS in L foot to improve tolerance with ADLs and reduced symptoms with activities.    Baseline 6/10 in standing currently   Time 6   Period Weeks   Status New   Target Date 11/19/16     PT LONG TERM GOAL #3   Title Patient will increase lower extremity functional scale to >47/80 to demonstrate improved functional mobility and increased tolerance with ADLs.    Baseline  22/80   Time 6   Period Weeks   Status New   Target Date 11/19/16     PT LONG TERM GOAL #4   Title Patient will increase 10 meter walk test to >1.26m/s as to improve gait speed for better community ambulation and to reduce fall risk.   Baseline 0.78m/s with no AD and SBA for safety   Time 6   Period Weeks   Status New   Target Date 11/19/16     PT LONG TERM GOAL #5   Title Pt's ankle DF AROM will increase by at least 6 deg to facilitate improved gait mechanics and balance for improved functional mobility   Baseline 6 deg bilaterally in NWB position   Time 6   Period Weeks   Status New   Target Date 11/19/16     Additional Long Term Goals   Additional Long Term Goals Yes     PT LONG TERM GOAL #6   Title Pt will demonstrate improved gait mechanics with equal stride length and cadence bilaterally with erect posture in order to increased gait efficiency and prevent secondary dysfunction   Time 6   Period Weeks   Status New   Target Date 11/20/16               Plan - 10/23/16 1534    Clinical Impression Statement Pt is very tender to even light palpation of L foot, ankle, and calf. She is able to tolerate some  dorsiflexion mobilizations and stretching today. Avoided STM to plantarfascia due to excessive soreness following last session  and recommendation of podiatrist. Pt denies increase in pain following ultrasound. Pt reports that she has had steroid injections to her plantarfascia in the past without significant improvement so deferred intophoresis but may attempt at later session. Pt encouraged to continue HEP and follow-up as scheduled.   Rehab Potential Good   PT Frequency 2x / week   PT Duration 6 weeks   PT Treatment/Interventions ADLs/Self Care Home Management;Iontophoresis 4mg /ml Dexamethasone;Electrical Stimulation;Biofeedback;Cryotherapy;Gait training;Stair training;Balance training;Therapeutic exercise;Therapeutic activities;Functional mobility training;Neuromuscular re-education;Patient/family education;Manual techniques;Energy conservation;Taping;Splinting;Passive range of motion;Ultrasound;Orthotic Fit/Training;Dry needling   PT Next Visit Plan manual for foot and calf, exercise modalities   PT Home Exercise Plan advanced with sitting DF strengthening;    Consulted and Agree with Plan of Care Patient      Patient will benefit from skilled therapeutic intervention in order to improve the following deficits and impairments:  Abnormal gait, Decreased activity tolerance, Decreased balance, Decreased range of motion, Decreased mobility, Decreased strength, Difficulty walking, Hypomobility, Increased edema, Impaired flexibility, Impaired perceived functional ability, Impaired sensation, Postural dysfunction, Improper body mechanics, Pain, Increased fascial restricitons  Visit Diagnosis: Pain in left foot  Difficulty in walking, not elsewhere classified     Problem List Patient Active Problem List   Diagnosis Date Noted  . Colon cancer screening 05/08/2016  . Rectal bleeding 05/08/2016  . Achilles tendonitis 04/28/2016  . Bowel movement symptom 12/07/2014  . Intractable chronic migraine  without aura and without status migrainosus 12/05/2014  . Weight gain 12/01/2013  . Left hip pain 12/01/2013  . Migraine without aura 04/20/2013  . Depression 04/26/2012  . Lumbar back pain with radiculopathy affecting left lower extremity 02/09/2012  . Memory changes 10/17/2011   Phillips Grout PT, DPT   Huprich,Jason 10/23/2016, 3:50 PM  [place] MAIN [facility] SERVICES [address] [place], [place], [address] Phone: [phone]   Fax:  [phone]  Name: ESHAL PROPPS MRN: [id number] Date of Birth: 03-25-1970

## 2016-10-24 DIAGNOSIS — M545 Low back pain: Secondary | ICD-10-CM | POA: Diagnosis not present

## 2016-10-24 DIAGNOSIS — M546 Pain in thoracic spine: Secondary | ICD-10-CM | POA: Diagnosis not present

## 2016-10-24 DIAGNOSIS — M5184 Other intervertebral disc disorders, thoracic region: Secondary | ICD-10-CM | POA: Diagnosis not present

## 2016-10-25 ENCOUNTER — Encounter: Payer: Self-pay | Admitting: Family Medicine

## 2016-10-28 ENCOUNTER — Ambulatory Visit: Payer: 59 | Admitting: Physical Therapy

## 2016-10-30 ENCOUNTER — Ambulatory Visit: Payer: 59 | Admitting: Sports Medicine

## 2016-10-30 ENCOUNTER — Ambulatory Visit: Payer: 59 | Attending: Sports Medicine | Admitting: Physical Therapy

## 2016-10-30 DIAGNOSIS — R262 Difficulty in walking, not elsewhere classified: Secondary | ICD-10-CM | POA: Insufficient documentation

## 2016-10-30 DIAGNOSIS — M6281 Muscle weakness (generalized): Secondary | ICD-10-CM | POA: Insufficient documentation

## 2016-10-30 DIAGNOSIS — M79672 Pain in left foot: Secondary | ICD-10-CM | POA: Insufficient documentation

## 2016-10-30 NOTE — Therapy (Signed)
[place] MAIN [facility] SERVICES [address] [place], [place], [address] Phone: [phone]   Fax:  [phone]  October 30, 2016   _0 @  Physical Therapy Discharge Summary  Patient: Bridget Beck  MRN: [id number]  Date of Birth: 10/18/1970   Diagnosis: Pain in left foot  Difficulty in walking, not elsewhere classified  Muscle weakness (generalized) Referring Provider: Dr. Cannon Kettle   The above patient had been seen in Physical Therapy 4 times of 8 treatments scheduled with 0 no shows and 2 cancellations.  The treatment consisted of manual therapy, stretches, modalities including [place];  The patient is: Unchanged  Subjective: Patient came in for therapy session and stated that she followed up with Dr. Patrice Paradise who diagnosed her back/LLE pain including foot pain as worsening RSD (reflex sympathetic dystrophy); I did offer to treat patient today with iontophoresis however she feels as though her foot pain is not related to plantar fasciitis but rather RSD. She has had RSD in the past in RUE and her doctor believes that her current LLE symptoms are an exacerbation of this. She requested to be discharged at this time. She will be following up with [facility].   Discharge Findings: continued LLE pain with decreased ROM and severe tenderness to touch;   Functional Status at Discharge: walking with limp with decreased weight bearing in LLE demonstrating antalgic gait pattern;  No Goals Met    Sincerely,   Jamy Cleckler, PT, DPT   CC _1 @  [place] [address] [place], [place], [address] Phone: [phone]   Fax:  [phone]  Patient: Bridget Beck  MRN: [id number]  Date of Birth: 04/05/1970

## 2016-11-01 ENCOUNTER — Ambulatory Visit (INDEPENDENT_AMBULATORY_CARE_PROVIDER_SITE_OTHER): Payer: 59 | Admitting: Sports Medicine

## 2016-11-01 DIAGNOSIS — R262 Difficulty in walking, not elsewhere classified: Secondary | ICD-10-CM | POA: Diagnosis not present

## 2016-11-01 DIAGNOSIS — M722 Plantar fascial fibromatosis: Secondary | ICD-10-CM

## 2016-11-01 DIAGNOSIS — I739 Peripheral vascular disease, unspecified: Secondary | ICD-10-CM | POA: Diagnosis not present

## 2016-11-01 DIAGNOSIS — M79672 Pain in left foot: Secondary | ICD-10-CM

## 2016-11-01 DIAGNOSIS — G90522 Complex regional pain syndrome I of left lower limb: Secondary | ICD-10-CM | POA: Diagnosis not present

## 2016-11-01 DIAGNOSIS — M779 Enthesopathy, unspecified: Secondary | ICD-10-CM | POA: Diagnosis not present

## 2016-11-01 NOTE — Progress Notes (Addendum)
Subjective: Bridget Beck is a 46 y.o. female patient returns to office for follow-up evaluation of heel pain on the left, states that pain is the same with no change. Patient states that she still deals with very intense episodes of pain into her left heel that also seems like it is radiating even to the ankle and up the foot and leg. States that she still has abnormal sensations of feeling like her foot and leg feels heavy. States that she feels numbness, tingling, burning and still deals with temperature changes to the leg. Patient reports that her back. Dr. reevaluated her and she was still having pain in her back and also pain in her hips who reports the diagnoses of RSD. Patient states that she was referred to do pain management. However, prefers to see Dr. Virgina Organ in [place]. Patient states that she is worried that she may not be able to work. If the pain management doctor put her on very strong pain medicines. Patient denies any other constitutional symptoms. No other issues noted.   Patient Active Problem List   Diagnosis Date Noted  . Colon cancer screening 05/08/2016  . Rectal bleeding 05/08/2016  . Achilles tendonitis 04/28/2016  . Bowel movement symptom 12/07/2014  . Intractable chronic migraine without aura and without status migrainosus 12/05/2014  . Weight gain 12/01/2013  . Left hip pain 12/01/2013  . Migraine without aura 04/20/2013  . Depression 04/26/2012  . Lumbar back pain with radiculopathy affecting left lower extremity 02/09/2012  . Memory changes 10/17/2011    Current Outpatient Prescriptions on File Prior to Visit  Medication Sig Dispense Refill  . diclofenac (VOLTAREN) 75 MG EC tablet Take 1 tablet (75 mg total) by mouth 2 (two) times daily. 30 tablet 0  . gabapentin (NEURONTIN) 300 MG capsule Take 1 capsule (300 mg total) by mouth at bedtime. (Patient taking differently: Take 300 mg by mouth 4 (four) times daily. 1 in the morning, 1 in the afternoon, 2 in  the evening) 90 capsule 1  . lidocaine (LIDODERM) 5 % Place 1 patch onto the skin daily. Remove & Discard patch within 12 hours or as directed by MD 30 patch 0  . meloxicam (MOBIC) 15 MG tablet Take 1 tablet (15 mg total) by mouth daily. 30 tablet 0  . naproxen (NAPROSYN) 500 MG tablet Take 1 tablet with sumatriptan tablet 20 tablet 2  . predniSONE (STERAPRED UNI-PAK 21 TAB) 10 MG (21) TBPK tablet Take as instructed 21 tablet 0  . promethazine (PHENERGAN) 25 MG tablet Take 1 tablet (25 mg total) by mouth every 6 (six) hours as needed for nausea. 40 tablet 3  . SUMAtriptan (IMITREX) 100 MG tablet Take 1 tablet (100 mg total) by mouth as directed. May repeat in 2 hours if headache persists or recurs. 10 tablet 5  . SUMAtriptan Succinate 6 MG/0.5ML SOCT Inject 6 Mg into skin at earliest onset of migraine, may repeat in 1 hour if headache persists, or reoccurs 6 mL 5  . topiramate (TOPAMAX) 25 MG tablet Take 3 tablets (75 mg total) by mouth at bedtime. 270 tablet 3  . traMADol (ULTRAM) 50 MG tablet Take 1 tablet (50 mg total) by mouth every 6 (six) hours as needed. 20 tablet 0  . valACYclovir (VALTREX) 1000 MG tablet Take 2 tablets (2,000 mg total) by mouth 2 (two) times daily. For 1 day.  Repeat as needed with future episodes. 8 tablet 1   Current Facility-Administered Medications on File Prior to Visit  Medication Dose Route Frequency Provider Last Rate Last Dose  . 0.9 %  sodium chloride infusion  500 mL Intravenous Continuous [name], [name], MD      . triamcinolone acetonide (KENALOG-40) injection 20 mg  20 mg Other Once [name], [name]        Allergies  Allergen Reactions  . Amitriptyline Other (See Comments)    Had severe migraine and pt could not see for 2 hours.  [name] [Pregabalin]     Migraine  . Methocarbamol Other (See Comments)    Stomach upset  . Prozac [Fluoxetine Hcl]     Fatigue   . Remeron [Mirtazapine]     "felt like a zombie"    Objective: Physical  Exam General: The patient is alert and oriented x3 in no acute distress.  Dermatology: Skin is dry and supple bilateral lower extremities. Nails 1-10 are normal. There is no erythema, no eccymosis, no open lesions present. Integument is otherwise unremarkable.  Vascular: Dorsalis Pedis pulse 2/4 and Posterior Tibial pulse are 1/4 bilateral. Capillary fill time is immediate to all digits. There are varicosities with mild decreased temperature gradient is on left.No hypertrophic skin changes.  Neurological: Grossly intact to light touch bilateral. Mild increased sensitivity to left lower leg and heel.  Musculoskeletal: Tenderness to palpation at the medial greater than lateral calcaneal tubercale and through the insertion of the plantar fascia on the left foot with moderate intensity at the medial aspect extending into arch as same as previous. There is minimal tenderness at the insertion of the Achilles on left. Minimal tenderness along the anterior ankle tendons on left. No pain with compression of calcaneus on left. No pain with calf compression on left. There is decreased Ankle joint range of motion bilateral with guarding on the left due to pain. All other joints range of motion within normal limits bilateral, however, mild guarding on left. Strength 5/5 in all groups bilateral.   Assessment and Plan: Problem List Items Addressed This Visit    None    Visit Diagnoses    Complex regional pain syndrome type 1 of left lower extremity    -  Primary   Inflammatory heel pain, left       Plantar fasciitis, left       Tendonitis       Foot pain, left       Difficulty walking       PVD (peripheral vascular disease) (HCC)         -Complete examination performed.  -Re-discussed with patient in detail the condition of inflammatory heel pain/ plantar fasciitis with compensation tendinitis, how this occurs and general treatment optionsAnd how in my opinion that this may be complicated by the condition  of RSD of which I agree to this diagnosis as determined by her back doctor, since patient with all treatments even some treatments that we have tried that are comparable for RSD still has not made any progress. -Referral made to pain management to Dr. Glenna Durand -Continue Lidoderm patch for pain -Patient stopped physical therapy because she wants to see what the pain management doctors will recommend -Advised patient to ice 1-2 times daily as instructed previously and heat if needed if soothing for patient -Continue with good supportive tennis shoes  -Continue with night splint to use on left as previously instructed -[employer] paperwork completed in current the patient will return to me on an as-needed basis since her symptoms are likely complicated by a much larger pain syndrome,  which I am referring as well Patient to pain management for further management; Defer to pain management for return to work status. -Patient to return to office as needed r follow-up evaluation or sooner if problems or questions arise.  [name], [name]

## 2016-11-04 ENCOUNTER — Telehealth: Payer: Self-pay | Admitting: *Deleted

## 2016-11-04 ENCOUNTER — Ambulatory Visit: Payer: 59 | Admitting: Physical Therapy

## 2016-11-04 DIAGNOSIS — M79672 Pain in left foot: Secondary | ICD-10-CM

## 2016-11-04 DIAGNOSIS — M779 Enthesopathy, unspecified: Secondary | ICD-10-CM

## 2016-11-04 DIAGNOSIS — R262 Difficulty in walking, not elsewhere classified: Secondary | ICD-10-CM

## 2016-11-04 DIAGNOSIS — I739 Peripheral vascular disease, unspecified: Secondary | ICD-10-CM

## 2016-11-04 DIAGNOSIS — M722 Plantar fascial fibromatosis: Secondary | ICD-10-CM

## 2016-11-04 DIAGNOSIS — G90522 Complex regional pain syndrome I of left lower limb: Secondary | ICD-10-CM

## 2016-11-04 NOTE — Telephone Encounter (Addendum)
-----   Message from [name], [place] sent at 11/01/2016  4:37 PM EDT ----- Regarding: Refer to Pain management Patient request to see DR. SCOTT A BERTRAND for pain management I know that he is a anesthesiologist at the [facility] center of [place], off [address].  Thanks Dr Cannon Kettle. 11/04/2016-I spoke with [facility] and she states Dr. Isaiah Blakes had retired. 11/05/2016-I informed pt of Dr. Gareth Eagle retirement and orders to refer to [facility] from Dr. Cannon Kettle. Pt agreed. Referral form, clinicals and demographic faxed to [facility].

## 2016-11-04 NOTE — Telephone Encounter (Signed)
We can refer to Dr. Mirna Mires and let patient know that Dr. Isaiah Blakes has retired. Thanks Dr. Chauncey Cruel

## 2016-11-05 NOTE — Telephone Encounter (Signed)
-----   Message from [name], [place] sent at 11/01/2016  4:37 PM EDT ----- Regarding: Refer to Pain management Patient request to see DR. SCOTT A BERTRAND for pain management I know that he is a anesthesiologist at the surgical center of [place], off [address].  Thanks Dr Cannon Kettle

## 2016-11-06 ENCOUNTER — Encounter: Payer: Self-pay | Admitting: Sports Medicine

## 2016-11-06 ENCOUNTER — Ambulatory Visit: Payer: 59 | Admitting: Physical Therapy

## 2016-11-13 ENCOUNTER — Telehealth: Payer: Self-pay | Admitting: *Deleted

## 2016-11-13 NOTE — Telephone Encounter (Addendum)
-----   Message from [name], [place] sent at 11/13/2016 11:51 AM EDT ----- Regarding: Pain management referral Can you let patient know who she has been referred to for pain management? Thanks Dr. Cannon Kettle. I informed pt the referral to [facility] [phone], and referral had been sent 11/05/2016.

## 2016-11-21 NOTE — Telephone Encounter (Signed)
Bridget Beck - [facility] states she doesn't have a copy of pt's insurance card, and part of referral stated pt wanted to be seen by Dr. Isaiah Blakes, please advise if pt is to be referred to Dr. Mirna Mires and fax a copy of the insurance card.

## 2016-11-22 NOTE — Telephone Encounter (Signed)
[place] states pt is scheduled to see Dr. Mirna Mires on 12/30/2016, pt is aware of the appt.

## 2016-12-03 ENCOUNTER — Encounter: Payer: Self-pay | Admitting: Physical Therapy

## 2016-12-05 ENCOUNTER — Encounter: Payer: Self-pay | Admitting: Physical Therapy

## 2016-12-10 ENCOUNTER — Encounter: Payer: Self-pay | Admitting: Physical Therapy

## 2016-12-12 ENCOUNTER — Encounter: Payer: Self-pay | Admitting: Physical Therapy

## 2016-12-30 DIAGNOSIS — M79652 Pain in left thigh: Secondary | ICD-10-CM | POA: Diagnosis not present

## 2016-12-30 DIAGNOSIS — M545 Low back pain: Secondary | ICD-10-CM | POA: Diagnosis not present

## 2016-12-30 DIAGNOSIS — G894 Chronic pain syndrome: Secondary | ICD-10-CM | POA: Diagnosis not present

## 2016-12-30 DIAGNOSIS — M79672 Pain in left foot: Secondary | ICD-10-CM | POA: Diagnosis not present

## 2017-01-03 ENCOUNTER — Telehealth: Payer: Self-pay | Admitting: Family Medicine

## 2017-01-03 NOTE — Telephone Encounter (Signed)
Copied from [place]. Topic: Quick Communication - Office Called Patient >> Jan 03, 2017  5:52 PM Cecelia Byars, [place] wrote: Reason for RSW:NIOEVOJ returned call from [facility]  please call again

## 2017-01-06 ENCOUNTER — Ambulatory Visit: Payer: Self-pay | Admitting: Family Medicine

## 2017-01-08 ENCOUNTER — Telehealth: Payer: Self-pay | Admitting: *Deleted

## 2017-01-08 NOTE — Telephone Encounter (Signed)
See 01/08/17 phone note

## 2017-01-08 NOTE — Telephone Encounter (Signed)
Spoke to pt and advised, that unfortunately, we will no longer be able to provide medical services for her at [facility], due to her disruptive behavior towards the front desk staff. Pt indicates she did not receive a dismissal letter, and was advised we will mail it to her. Pt was also advised we would not be able to complete any of her disability forms, and her 01/09/17 would be cancelled.

## 2017-01-09 ENCOUNTER — Ambulatory Visit: Payer: Self-pay | Admitting: Family Medicine

## 2017-01-09 ENCOUNTER — Telehealth: Payer: Self-pay | Admitting: Family Medicine

## 2017-01-09 NOTE — Telephone Encounter (Signed)
Patient dismissed from [facility] by Elsie Stain MD , effective October 25, 2016. Dismissal letter sent out by certified / registered mail. daj

## 2017-01-10 ENCOUNTER — Encounter: Payer: Self-pay | Admitting: Sports Medicine

## 2017-01-10 ENCOUNTER — Telehealth: Payer: Self-pay | Admitting: Sports Medicine

## 2017-01-10 NOTE — Telephone Encounter (Signed)
Received a personal text from patient to call her. I returned her call. Patient reports that she need an updated work status because her pain doctor is unable to do any paperwork. Patient states that she was told by Dr. Mirna Mires her pain doctor that his license will not allow him to complete any papers for work status and that he recommend her to get this from a doctor who has been treating her. Patient states that she has a recheck with Dr. Mirna Mires on 01/27/17 and that the current regimen of Clonazepam he has her own is helping. I advised patient that I will send letter to [employer] on her behalf recommending sedentary work. I advised patient that she needs to have her pain doctor send me his office notes as supportive information for her current prognosis/diagnosis and to arrange final follow up after she has seen Dr. Mirna Mires. Patient expressed understanding and thanked me for helping her. -Dr. Cannon Kettle

## 2017-01-10 NOTE — Progress Notes (Signed)
Letter composed for patient and sent to [employer] Current Work status: Sedentary work -Dr. Cannon Kettle

## 2017-01-17 ENCOUNTER — Ambulatory Visit (INDEPENDENT_AMBULATORY_CARE_PROVIDER_SITE_OTHER): Payer: 59 | Admitting: Neurology

## 2017-01-17 DIAGNOSIS — G43009 Migraine without aura, not intractable, without status migrainosus: Secondary | ICD-10-CM

## 2017-01-17 MED ORDER — ONABOTULINUMTOXINA 100 UNITS IJ SOLR
155.0000 [IU] | Freq: Once | INTRAMUSCULAR | Status: AC
  Administered 2017-01-17: 155 [IU] via INTRAMUSCULAR

## 2017-01-17 NOTE — Progress Notes (Signed)
[facility]   Procedure Note Botox  Attending: Dr. Metta Clines  Preoperative Diagnosis(es): Chronic migraine  Consent obtained from: The patient Benefits discussed included, but were not limited to decreased muscle tightness, increased joint range of motion, and decreased pain.  Risk discussed included, but were not limited pain and discomfort, bleeding, bruising, excessive weakness, venous thrombosis, muscle atrophy and dysphagia.  Anticipated outcomes of the procedure as well as he risks and benefits of the alternatives to the procedure, and the roles and tasks of the personnel to be involved, were discussed with the patient, and the patient consents to the procedure and agrees to proceed. A copy of the patient medication guide was given to the patient which explains the blackbox warning.  Patients identity and treatment sites confirmed Yes.  .  Details of Procedure: Skin was cleaned with alcohol. Prior to injection, the needle plunger was aspirated to make sure the needle was not within a blood vessel.  There was no blood retrieved on aspiration.    Following is a summary of the muscles injected  And the amount of Botulinum toxin used:  Dilution 200 units of Botox was reconstituted with 4 ml of preservative free normal saline. Time of reconstitution: At the time of the office visit (<30 minutes prior to injection)   Injections  155 total units of Botox was injected with a 30 gauge needle.  Injection Sites: L occipitalis: 15 units- 3 sites  R occiptalis: 15 units- 3 sites  L upper trapezius: 15 units- 3 sites R upper trapezius: 15 units- 3 sits          L paraspinal: 10 units- 2 sites R paraspinal: 10 units- 2 sites  Face L frontalis(2 injection sites):10 units   R frontalis(2 injection sites):10 units         L corrugator: 5 units   R corrugator: 5 units           Procerus: 5 units   L temporalis: 20 units R temporalis: 20 units   Agent:  200 units of botulinum Type  A (Onobotulinum Toxin type A) was reconstituted with 4 ml of preservative free normal saline.  Time of reconstitution: At the time of the office visit (<30 minutes prior to injection)     Total injected (Units): 155  Total wasted (Units): none wasted  Patient tolerated procedure well without complications.   Reinjection is anticipated in 3 months. Return to clinic in next 4 to 6 weeks

## 2017-01-27 DIAGNOSIS — M79652 Pain in left thigh: Secondary | ICD-10-CM | POA: Diagnosis not present

## 2017-01-27 DIAGNOSIS — M79672 Pain in left foot: Secondary | ICD-10-CM | POA: Diagnosis not present

## 2017-01-27 DIAGNOSIS — M545 Low back pain: Secondary | ICD-10-CM | POA: Diagnosis not present

## 2017-01-27 DIAGNOSIS — G894 Chronic pain syndrome: Secondary | ICD-10-CM | POA: Diagnosis not present

## 2017-01-27 NOTE — Telephone Encounter (Signed)
Received signed domestic return receipt verifying delivery of certified letter on January 16, 2017. Article number [id number] [phone] WLS

## 2017-01-29 ENCOUNTER — Encounter: Payer: Self-pay | Admitting: Sports Medicine

## 2017-01-29 NOTE — Progress Notes (Signed)
Patient texted me requesting updated work note. This note is to be sent to [employer] at [employer] [phone]

## 2017-01-30 ENCOUNTER — Encounter: Payer: Self-pay | Admitting: Sports Medicine

## 2017-02-21 ENCOUNTER — Ambulatory Visit (INDEPENDENT_AMBULATORY_CARE_PROVIDER_SITE_OTHER): Payer: 59 | Admitting: Neurology

## 2017-02-21 ENCOUNTER — Encounter: Payer: Self-pay | Admitting: Neurology

## 2017-02-21 VITALS — BP 112/76 | HR 67 | Ht 64.0 in | Wt 239.2 lb

## 2017-02-21 DIAGNOSIS — G43009 Migraine without aura, not intractable, without status migrainosus: Secondary | ICD-10-CM | POA: Diagnosis not present

## 2017-02-21 DIAGNOSIS — G43719 Chronic migraine without aura, intractable, without status migrainosus: Secondary | ICD-10-CM | POA: Diagnosis not present

## 2017-02-21 MED ORDER — TOPIRAMATE 25 MG PO TABS: 75.0000 mg | ORAL_TABLET | Freq: Every day | ORAL | 3 refills | Status: DC

## 2017-02-21 NOTE — Progress Notes (Signed)
NEUROLOGY FOLLOW UP OFFICE NOTE  Bridget Beck [id number]  HISTORY OF PRESENT ILLNESS: Bridget Beck is a 47 year old right-handed woman with depression who follows up for migraine.   UPDATE: Intensity:  6/10 Duration:  30-45 minutes but lingers for a couple of days Frequency:  Approximately, once a month (triggered by change in weather or menses usually) Current NSAIDS:  no Current analgesics:  no Current triptans:  sumatriptan 100mg  Current anti-emetic:  promethazine 25mg  Current muscle relaxants:  no Current anti-anxiolytic:  no Current sleep aide:  no Current Antihypertensive medications:  no Current Antidepressant medications:  no Current Anticonvulsant medications:  topiramate 75mg , gabapentin 400mg  4x/daily (for CRPS/back pain) Current Vitamins/Herbal/Supplements:  no Current Antihistamines/Decongestants:  no Other therapy:  Botox    HISTORY: Onset:  Since childhood. Location:  Usually left sided (rarely right-sided or holocephalic) Quality:  pounding Initial intensity:  10/10 Aura:  no Associated symptoms:  Nausea, photophobia, phonophobia, osmophobia, difficulty seeing (cannot elaborate), talks jibberish, sometimes vomiting Initial duration:  Several hours but dull residual headache for a day or two afterwards Initial frequency:  25 days per month Triggers/exacerbating factors:  Peanuts, chocolate, smoked foods, alcohol, change in weather, menstrual cycle Relieving factors:  none Activity:  Needs to lay down in dark quite room   Past abortive therapy:  Bridget 40mg  (effective but cost too much), [name], ibuprofen (takes edge off), Bridget Beck Past preventative therapy:  Depakote (side effects), propranolol (unsure of dose.  ineffective), amitriptyline 25mg  (triggered headache), zonisamide 600mg  (stopped being effective)   Regarding restless leg:  Mirapex caused headaches.  She was started on ropinirole 0.25mg .  This has been effective.  PAST  MEDICAL HISTORY: Past Medical History:  Diagnosis Date  . Depression    Managed  . GERD (gastroesophageal reflux disease)   . Migraines    with aura    MEDICATIONS: Current Outpatient Medications on File Prior to Visit  Medication Sig Dispense Refill  . gabapentin (NEURONTIN) 400 MG capsule Take 400 mg by mouth 4 (four) times daily.    . diclofenac (VOLTAREN) 75 MG EC tablet Take 1 tablet (75 mg total) by mouth 2 (two) times daily. (Patient not taking: Reported on 02/21/2017) 30 tablet 0  . gabapentin (NEURONTIN) 300 MG capsule Take 1 capsule (300 mg total) by mouth at bedtime. (Patient not taking: Reported on 02/21/2017) 90 capsule 1  . lidocaine (LIDODERM) 5 % Place 1 patch onto the skin daily. Remove & Discard patch within 12 hours or as directed by MD 30 patch 0  . meloxicam (MOBIC) 15 MG tablet Take 1 tablet (15 mg total) by mouth daily. (Patient not taking: Reported on 02/21/2017) 30 tablet 0  . naproxen (NAPROSYN) 500 MG tablet Take 1 tablet with sumatriptan tablet 20 tablet 2  . predniSONE (STERAPRED UNI-PAK 21 TAB) 10 MG (21) TBPK tablet Take as instructed (Patient not taking: Reported on 02/21/2017) 21 tablet 0  . promethazine (PHENERGAN) 25 MG tablet Take 1 tablet (25 mg total) by mouth every 6 (six) hours as needed for nausea. 40 tablet 3  . SUMAtriptan (IMITREX) 100 MG tablet Take 1 tablet (100 mg total) by mouth as directed. May repeat in 2 hours if headache persists or recurs. 10 tablet 5  . SUMAtriptan Succinate 6 MG/0.5ML SOCT Inject 6 Mg into skin at earliest onset of migraine, may repeat in 1 hour if headache persists, or reoccurs 6 mL 5  . traMADol (ULTRAM) 50 MG tablet Take 1 tablet (50 mg total)  by mouth every 6 (six) hours as needed. 20 tablet 0  . valACYclovir (VALTREX) 1000 MG tablet Take 2 tablets (2,000 mg total) by mouth 2 (two) times daily. For 1 day.  Repeat as needed with future episodes. 8 tablet 1   Current Facility-Administered Medications on File Prior to  Visit  Medication Dose Route Frequency Provider Last Rate Last Dose  . 0.9 %  sodium chloride infusion  500 mL Intravenous Continuous Bridget Beck, Bridget Lines, MD      . triamcinolone acetonide (KENALOG-40) injection 20 mg  20 mg Other Once Bridget Beck, [name]        ALLERGIES: Allergies  Allergen Reactions  . Amitriptyline Other (See Comments)    Had severe migraine and pt could not see for 2 hours.  Bridget Beck [Pregabalin]     Migraine  . Methocarbamol Other (See Comments)    Stomach upset  . Prozac [Fluoxetine Hcl]     Fatigue   . Remeron [Mirtazapine]     "felt like a zombie"    FAMILY HISTORY: Family History  Problem Relation Age of Onset  . Peripheral vascular disease Father   . Bone cancer Father   . Stroke Sister        possible CVA  . Diabetes Mother        type II, diet controlled  . Cancer Maternal Aunt        non hodgkins lymphoma  . Heart disease Maternal Grandmother   . Breast cancer Maternal Aunt     SOCIAL HISTORY: Social History   Socioeconomic History  . Marital status: Married    Spouse name: Not on file  . Number of children: 3  . Years of education: Not on file  . Highest education level: Not on file  Social Needs  . Financial resource strain: Not on file  . Food insecurity - worry: Not on file  . Food insecurity - inability: Not on file  . Transportation needs - medical: Not on file  . Transportation needs - non-medical: Not on file  Occupational History  . Occupation: CMA    Employer:  [employer]  Tobacco Use  . Smoking status: Former Smoker    Packs/day: 0.10    Years: 3.00    Pack years: 0.30    Types: Cigarettes    Last attempt to quit: 10/28/2012    Years since quitting: 4.3  . Smokeless tobacco: Former Systems developer    Quit date: 11/10/2012  Substance and Sexual Activity  . Alcohol use: No    Alcohol/week: 0.0 oz  . Drug use: No  . Sexual activity: Yes    Partners: Male  Other Topics Concern  . Not on file  Social History  Narrative   2 years of college.   Former CMA at [employer]   1st husband died from [employer] in 2010-05-22   Remarried    3 kids    REVIEW OF SYSTEMS: Constitutional: No fevers, chills, or sweats, no generalized fatigue, change in appetite Eyes: No visual changes, double vision, eye pain Ear, nose and throat: No hearing loss, ear pain, nasal congestion, sore throat Cardiovascular: No chest pain, palpitations Respiratory:  No shortness of breath at rest or with exertion, wheezes GastrointestinaI: No nausea, vomiting, diarrhea, abdominal pain, fecal incontinence Genitourinary:  No dysuria, urinary retention or frequency Musculoskeletal:  No neck pain, back pain Integumentary: No rash, pruritus, skin lesions Neurological: as above Psychiatric: No depression, insomnia, anxiety Endocrine: No palpitations, fatigue, diaphoresis, mood swings, change  in appetite, change in weight, increased thirst Hematologic/Lymphatic:  No purpura, petechiae. Allergic/Immunologic: no itchy/runny eyes, nasal congestion, recent allergic reactions, rashes  PHYSICAL EXAM: Vitals:   02/21/17 1447  BP: 112/76  Pulse: 67  SpO2: 90%   General: No acute distress.  Patient appears well-groomed.. Head:  Normocephalic/atraumatic Eyes:  Fundi examined but not visualized Neck: supple, no paraspinal tenderness, full range of motion Heart:  Regular rate and rhythm Lungs:  Clear to auscultation bilaterally Back: No paraspinal tenderness Neurological Exam: alert and oriented to person, place, and time. Attention span and concentration intact, recent and remote memory intact, fund of knowledge intact.  Speech fluent and not dysarthric, language intact.  CN II-XII intact. Bulk and tone normal, muscle strength 5/5 throughout.  Sensation to light touch  intact.  Deep tendon reflexes 2+ throughout.  Finger to nose testing intact.  Gait normal, Romberg negative.  IMPRESSION: Migraines, stable overall  PLAN: 1.  Continue  topiramate 75mg  at bedtime 2.  Sumatriptan as needed/directed 3.  Follow up for Botox in March  Metta Clines, DO  CC:  Lenise Arena [place], [place]

## 2017-02-21 NOTE — Patient Instructions (Signed)
1.  Continue topiramate 75mg  at bedtime 2.  Use sumatriptan as needed/directed 3.  Follow up for next Botox

## 2017-02-22 ENCOUNTER — Emergency Department: Payer: 59

## 2017-02-22 ENCOUNTER — Emergency Department
Admission: EM | Admit: 2017-02-22 | Discharge: 2017-02-23 | Disposition: A | Discharge status: Home / Self Care | Payer: 59 | Attending: Emergency Medicine | Admitting: Emergency Medicine

## 2017-02-22 DIAGNOSIS — Z87891 Personal history of nicotine dependence: Secondary | ICD-10-CM | POA: Insufficient documentation

## 2017-02-22 DIAGNOSIS — R1011 Right upper quadrant pain: Secondary | ICD-10-CM | POA: Insufficient documentation

## 2017-02-22 DIAGNOSIS — R109 Unspecified abdominal pain: Secondary | ICD-10-CM

## 2017-02-22 DIAGNOSIS — R1031 Right lower quadrant pain: Secondary | ICD-10-CM | POA: Insufficient documentation

## 2017-02-22 DIAGNOSIS — F329 Major depressive disorder, single episode, unspecified: Secondary | ICD-10-CM | POA: Insufficient documentation

## 2017-02-22 DIAGNOSIS — Z79899 Other long term (current) drug therapy: Secondary | ICD-10-CM | POA: Diagnosis not present

## 2017-02-22 LAB — CBC
HCT: 41 % (ref 35.0–47.0)
Hemoglobin: 14.2 g/dL (ref 12.0–16.0)
MCH: 29.6 pg (ref 26.0–34.0)
MCHC: 34.7 g/dL (ref 32.0–36.0)
MCV: 85.3 fL (ref 80.0–100.0)
PLATELETS: 259 10*3/uL (ref 150–440)
RBC: 4.8 MIL/uL (ref 3.80–5.20)
RDW: 13.2 % (ref 11.5–14.5)
WBC: 9.5 10*3/uL (ref 3.6–11.0)

## 2017-02-22 LAB — URINALYSIS, COMPLETE (UACMP) WITH MICROSCOPIC
BILIRUBIN URINE: NEGATIVE
Bacteria, UA: NONE SEEN
Glucose, UA: NEGATIVE mg/dL
Hgb urine dipstick: NEGATIVE
Ketones, ur: NEGATIVE mg/dL
Leukocytes, UA: NEGATIVE
NITRITE: NEGATIVE
PH: 6 (ref 5.0–8.0)
Protein, ur: NEGATIVE mg/dL
RBC / HPF: NONE SEEN RBC/hpf (ref 0–5)
SPECIFIC GRAVITY, URINE: 1.012 (ref 1.005–1.030)

## 2017-02-22 LAB — LIPASE, BLOOD: Lipase: 44 U/L (ref 11–51)

## 2017-02-22 LAB — TROPONIN I: Troponin I: <0.03 ng/mL (ref <0.03)

## 2017-02-22 LAB — HEPATIC FUNCTION PANEL
ALT: 16 U/L (ref 14–54)
AST: 18 U/L (ref 15–41)
Albumin: 4.1 g/dL (ref 3.5–5.0)
Alkaline Phosphatase: 56 U/L (ref 38–126)
BILIRUBIN TOTAL: 0.5 mg/dL (ref 0.3–1.2)
Bilirubin, Direct: <0.1 mg/dL — ABNORMAL LOW (ref 0.1–0.5)
Total Protein: 7 g/dL (ref 6.5–8.1)

## 2017-02-22 LAB — BASIC METABOLIC PANEL
Anion gap: 8 (ref 5–15)
BUN: 19 mg/dL (ref 6–20)
CALCIUM: 8.9 mg/dL (ref 8.9–10.3)
CO2: 22 mmol/L (ref 22–32)
CREATININE: 0.82 mg/dL (ref 0.44–1.00)
Chloride: 106 mmol/L (ref 101–111)
GFR calc Af Amer: >60 mL/min (ref >60)
GLUCOSE: 95 mg/dL (ref 65–99)
Potassium: 3.8 mmol/L (ref 3.5–5.1)
SODIUM: 136 mmol/L (ref 135–145)

## 2017-02-22 LAB — HCG, QUANTITATIVE, PREGNANCY

## 2017-02-22 MED ORDER — ONDANSETRON HCL 4 MG/2ML IJ SOLN
4.0000 mg | Freq: Once | INTRAMUSCULAR | Status: AC
  Administered 2017-02-22: 4 mg via INTRAVENOUS
  Filled 2017-02-22: qty 2

## 2017-02-22 MED ORDER — SODIUM CHLORIDE 0.9 % IV BOLUS (SEPSIS)
1000.0000 mL | Freq: Once | INTRAVENOUS | Status: AC
  Administered 2017-02-22: 1000 mL via INTRAVENOUS

## 2017-02-22 MED ORDER — OXYCODONE-ACETAMINOPHEN 5-325 MG PO TABS
ORAL_TABLET | ORAL | Status: AC
  Filled 2017-02-22: qty 1

## 2017-02-22 MED ORDER — KETOROLAC TROMETHAMINE 30 MG/ML IJ SOLN
15.0000 mg | INTRAMUSCULAR | Status: AC
  Administered 2017-02-22: 15 mg via INTRAVENOUS
  Filled 2017-02-22: qty 1

## 2017-02-22 MED ORDER — OXYCODONE-ACETAMINOPHEN 5-325 MG PO TABS
1.0000 | ORAL_TABLET | ORAL | Status: DC | PRN
  Administered 2017-02-22: 1 via ORAL

## 2017-02-22 NOTE — ED Triage Notes (Signed)
Pt came to Ed via pov c/o right sided flank pain with radiation to chest. History of chronic back pain. VS stable.

## 2017-02-22 NOTE — ED Notes (Signed)
Patient transported to Ultrasound 

## 2017-02-22 NOTE — ED Notes (Signed)
Pt assisted to toilet to void 

## 2017-02-22 NOTE — ED Provider Notes (Signed)
[facility] Emergency Department Provider Note  ____________________________________________  Time seen: Approximately 11:26 PM  I have reviewed the triage vital signs and the nursing notes.   HISTORY  Chief Complaint Flank Pain and Chest Pain    HPI Bridget Beck is a 47 y.o. female who complains of right flank pain starting this morning on waking up. Constant, severe, nonradiating. No aggravating or alleviating factors. She ate a normal breakfast without difficulty, hasn't eaten or drank anything since then. Denies fevers chills or sweats.     Past Medical History:  Diagnosis Date  . Depression    Managed  . GERD (gastroesophageal reflux disease)   . Migraines    with aura     Patient Active Problem List   Diagnosis Date Noted  . Colon cancer screening 05/08/2016  . Rectal bleeding 05/08/2016  . Achilles tendonitis 04/28/2016  . Bowel movement symptom 12/07/2014  . Intractable chronic migraine without aura and without status migrainosus 12/05/2014  . Weight gain 12/01/2013  . Left hip pain 12/01/2013  . Migraine without aura 04/20/2013  . Depression 04/26/2012  . Lumbar back pain with radiculopathy affecting left lower extremity 02/09/2012  . Memory changes 10/17/2011     Past Surgical History:  Procedure Laterality Date  . CERVICAL FUSION     x 2  . INCONTINENCE SURGERY  2004     Prior to Admission medications   Medication Sig Start Date End Date Taking? Authorizing Provider  diclofenac (VOLTAREN) 75 MG EC tablet Take 1 tablet (75 mg total) by mouth 2 (two) times daily. Patient not taking: Reported on 02/21/2017 10/18/16   [name], [name]  gabapentin (NEURONTIN) 300 MG capsule Take 1 capsule (300 mg total) by mouth at bedtime. Patient not taking: Reported on 02/21/2017 05/06/16   Pieter Partridge, DO  gabapentin (NEURONTIN) 400 MG capsule Take 400 mg by mouth 4 (four) times daily.    [provider]  lidocaine (LIDODERM)  5 % Place 1 patch onto the skin daily. Remove & Discard patch within 12 hours or as directed by MD 10/04/16   [name], [name]  meloxicam (MOBIC) 15 MG tablet Take 1 tablet (15 mg total) by mouth daily. Patient not taking: Reported on 02/21/2017 08/29/16   [name], [name]  naproxen (NAPROSYN) 500 MG tablet Take 1 tablet with sumatriptan tablet 08/06/16   Tomi Likens, Adam R, DO  predniSONE (STERAPRED UNI-PAK 21 TAB) 10 MG (21) TBPK tablet Take as instructed Patient not taking: Reported on 02/21/2017 10/18/16   [name], [name]  promethazine (PHENERGAN) 25 MG tablet Take 1 tablet (25 mg total) by mouth every 6 (six) hours as needed for nausea. 02/05/16   Pieter Partridge, DO  SUMAtriptan (IMITREX) 100 MG tablet Take 1 tablet (100 mg total) by mouth as directed. May repeat in 2 hours if headache persists or recurs. 01/05/16   Pieter Partridge, DO  SUMAtriptan Succinate 6 MG/0.5ML SOCT Inject 6 Mg into skin at earliest onset of migraine, may repeat in 1 hour if headache persists, or reoccurs 10/06/15   Tomi Likens, Adam R, DO  topiramate (TOPAMAX) 25 MG tablet Take 3 tablets (75 mg total) by mouth at bedtime. 02/21/17   Pieter Partridge, DO  traMADol (ULTRAM) 50 MG tablet Take 1 tablet (50 mg total) by mouth every 6 (six) hours as needed. 09/04/16 09/04/17  Earleen Newport, MD  valACYclovir (VALTREX) 1000 MG tablet Take 2 tablets (2,000 mg total) by mouth 2 (two) times daily.  For 1 day.  Repeat as needed with future episodes. 05/25/12   Tonia Ghent, MD     Allergies Amitriptyline; Lyrica [pregabalin]; Methocarbamol; Prozac [fluoxetine hcl]; and Remeron [mirtazapine]   Family History  Problem Relation Age of Onset  . Peripheral vascular disease Father   . Bone cancer Father   . Stroke Sister        possible CVA  . Diabetes Mother        type II, diet controlled  . Cancer Maternal Aunt        non hodgkins lymphoma  . Heart disease Maternal Grandmother   . Breast cancer Maternal Aunt     Social  History Social History   Tobacco Use  . Smoking status: Former Smoker    Packs/day: 0.10    Years: 3.00    Pack years: 0.30    Types: Cigarettes    Last attempt to quit: 10/28/2012    Years since quitting: 4.3  . Smokeless tobacco: Former Systems developer    Quit date: 11/10/2012  Substance Use Topics  . Alcohol use: No    Alcohol/week: 0.0 oz  . Drug use: No    Review of Systems  Constitutional:   No fever or chills.  ENT:   No sore throat. No rhinorrhea. Cardiovascular:   No chest pain or syncope. Respiratory:   No dyspnea or cough. Gastrointestinal:   Positive for right flank pain without vomiting and diarrhea.  Musculoskeletal:   Negative for focal pain or swelling All other systems reviewed and are negative except as documented above in ROS and HPI.  ____________________________________________   PHYSICAL EXAM:  VITAL SIGNS: ED Triage Vitals  Enc Vitals Group     BP 02/22/17 1721 132/73     Pulse Rate 02/22/17 1721 78     Resp 02/22/17 1721 16     Temp 02/22/17 1721 97.8 F (36.6 C)     Temp Source 02/22/17 1721 Oral     SpO2 02/22/17 1721 100 %     Weight 02/22/17 1721 239 lb (108.4 kg)     Height 02/22/17 1721 5\' 4"  (1.626 m)     Head Circumference --      Peak Flow --      Pain Score 02/22/17 1958 8     Pain Loc --      Pain Edu? --      Excl. in [place]? --     Vital signs reviewed, nursing assessments reviewed.   Constitutional:   Alert and oriented. Well appearing and in no distress. Eyes:   No scleral icterus.  EOMI. No nystagmus. No conjunctival pallor. PERRL. ENT   Head:   Normocephalic and atraumatic.   Nose:   No congestion/rhinnorhea.    Mouth/Throat:   MMM, no pharyngeal erythema. No peritonsillar mass.    Neck:   No meningismus. Full ROM. Hematological/Lymphatic/Immunilogical:   No cervical lymphadenopathy. Cardiovascular:   RRR. Symmetric bilateral radial and DP pulses.  No murmurs.  Respiratory:   Normal respiratory effort without  tachypnea/retractions. Breath sounds are clear and equal bilaterally. No wheezes/rales/rhonchi. Gastrointestinal:   Soft with right upper quadrant tenderness. Non distended. There is no CVA tenderness.  No rebound, rigidity, or guarding. Genitourinary:   deferred Musculoskeletal:   Normal range of motion in all extremities. No joint effusions.  No lower extremity tenderness.  No edema. Neurologic:   Normal speech and language.  Motor grossly intact. No acute focal neurologic deficits are appreciated.  Skin:    Skin  is warm, dry and intact. No rash noted.  No petechiae, purpura, or bullae.  ____________________________________________    LABS (pertinent positives/negatives) (all labs ordered are listed, but only abnormal results are displayed) Labs Reviewed  URINALYSIS, COMPLETE (UACMP) WITH MICROSCOPIC - Abnormal; Notable for the following components:      Result Value   Color, Urine YELLOW (*)    APPearance CLOUDY (*)    Squamous Epithelial / LPF 0-5 (*)    All other components within normal limits  HEPATIC FUNCTION PANEL - Abnormal; Notable for the following components:   Bilirubin, Direct <0.1 (*)    All other components within normal limits  BASIC METABOLIC PANEL  CBC  TROPONIN I  LIPASE, BLOOD  HCG, QUANTITATIVE, PREGNANCY  PREGNANCY, URINE   ____________________________________________   EKG  Interpreted by me  Date: 02/22/2017  Rate: 77  Rhythm: normal sinus rhythm  QRS Axis: normal  Intervals: normal  ST/T Wave abnormalities: normal  Conduction Disutrbances: none  Narrative Interpretation: unremarkable      ____________________________________________    RADIOLOGY  US Abdomen Limited Ruq  Result Date: 02/22/2017 CLINICAL DATA:  Right flank and right upper quadrant abdominal pain. EXAM: ULTRASOUND ABDOMEN LIMITED RIGHT UPPER QUADRANT COMPARISON:  None. FINDINGS: Gallbladder: No gallstones or wall thickening visualized. No sonographic Murphy sign noted  by sonographer. Common bile duct: Diameter: 2 mm Liver: No focal lesion identified. Within normal limits in parenchymal echogenicity. Portal vein is patent on color Doppler imaging with normal direction of blood flow towards the liver. IMPRESSION: No cholelithiasis or sonographic evidence for acute cholecystitis. Electronically Signed   By: Lovey Newcomer M.D.   On: 02/22/2017 20:32    ____________________________________________   PROCEDURES Procedures  ____________________________________________    CLINICAL IMPRESSION / ASSESSMENT AND PLAN / ED COURSE  Pertinent labs & imaging results that were available during my care of the patient were reviewed by me and considered in my medical decision making (see chart for details).     Clinical Course as of Feb 23 2324  Sat Feb 22, 2017  1909 Presents with right flank pain and right upper quadrant tenderness. Severe pain. I'll give Toradol and Zofran for symptom relief. IV fluids for hydration. Follow up on urine tests. Obtain an ultrasound of the right upper quadrant to evaluate for biliary disease. Differential includes biliary colic, cholecystitis, ureterolithiasis, pyelonephritis. Low suspicion for bowel obstruction or perforation or appendicitis.  [PS]  2227 Waiting for lab pregnancy test result to obtain CT renal  [PS]  2326 Hcg neg. Will proceed with CT  [PS]    Clinical Course User Index [PS] Carrie Mew, MD     ----------------------------------------- 11:27 PM on 02/22/2017 -----------------------------------------  Ultrasound negative for signs of cholelithiasis or cholecystitis. Overall unremarkable exam. Case signed out to Dr. Tressia Miners to follow up on CT results  ____________________________________________   FINAL CLINICAL IMPRESSION(S) / ED DIAGNOSES    Final diagnoses:  Right flank pain       Portions of this note were generated with dragon dictation software. Dictation errors may occur despite best  attempts at proofreading.    Carrie Mew, MD 02/22/17 [id number]

## 2017-02-23 DIAGNOSIS — R1031 Right lower quadrant pain: Secondary | ICD-10-CM | POA: Diagnosis not present

## 2017-02-23 DIAGNOSIS — R1011 Right upper quadrant pain: Secondary | ICD-10-CM | POA: Diagnosis not present

## 2017-02-23 DIAGNOSIS — F329 Major depressive disorder, single episode, unspecified: Secondary | ICD-10-CM | POA: Diagnosis not present

## 2017-02-23 DIAGNOSIS — Z79899 Other long term (current) drug therapy: Secondary | ICD-10-CM | POA: Diagnosis not present

## 2017-02-23 DIAGNOSIS — Z87891 Personal history of nicotine dependence: Secondary | ICD-10-CM | POA: Diagnosis not present

## 2017-02-23 LAB — PREGNANCY, URINE: Preg Test, Ur: NEGATIVE

## 2017-02-23 MED ORDER — GI COCKTAIL ~~LOC~~
30.0000 mL | Freq: Once | ORAL | Status: AC
  Administered 2017-02-23: 30 mL via ORAL
  Filled 2017-02-23: qty 30

## 2017-02-23 MED ORDER — FAMOTIDINE 20 MG PO TABS
40.0000 mg | ORAL_TABLET | Freq: Once | ORAL | Status: AC
  Administered 2017-02-23: 40 mg via ORAL
  Filled 2017-02-23: qty 2

## 2017-02-23 MED ORDER — OXYCODONE-ACETAMINOPHEN 5-325 MG PO TABS
1.0000 | ORAL_TABLET | Freq: Once | ORAL | Status: AC
  Administered 2017-02-23: 1 via ORAL
  Filled 2017-02-23: qty 1

## 2017-02-23 MED ORDER — OXYCODONE-ACETAMINOPHEN 7.5-325 MG PO TABS: 1.0000 | ORAL_TABLET | Freq: Four times a day (QID) | ORAL | 0 refills | Status: DC | PRN

## 2017-02-23 NOTE — ED Provider Notes (Signed)
Care signed over from Dr. Joni Fears pending results of CT stone.  CT scan is negative.  The patient's abdomen is benign and she is able to eat and drink.  I had a lengthy discussion with the results of the diagnostic uncertainty.  She has primary care follow-up in 1 day.  She is requesting referral to GI as an outpatient for chronic gastritis.  Discharged home in improved condition with strict return precautions.  She verbalized understanding and agreement with the plan.   Darel Hong, MD 02/23/17 [phone]

## 2017-02-23 NOTE — Discharge Instructions (Signed)
Fortunately today your CT scan, your ultrasound, your blood work, and your urinalysis were reassuring.  I do not have a clear reason why you are hurting.  Please keep your follow-up with your primary care physicians coming Monday for reevaluation and return to the emergency department for any concerns whatsoever.  It was a pleasure to take care of you today, and thank you for coming to our emergency department.  If you have any questions or concerns before leaving please ask the nurse to grab me and I'm more than happy to go through your aftercare instructions again.  If you were prescribed any opioid pain medication today such as Norco, Vicodin, Percocet, morphine, hydrocodone, or oxycodone please make sure you do not drive when you are taking this medication as it can alter your ability to drive safely.  If you have any concerns once you are home that you are not improving or are in fact getting worse before you can make it to your follow-up appointment, please do not hesitate to call 911 and come back for further evaluation.  Darel Hong, MD  Results for orders placed or performed during the hospital encounter of 75/64/33  Basic metabolic panel  Result Value Ref Range   Sodium 136 135 - 145 mmol/L   Potassium 3.8 3.5 - 5.1 mmol/L   Chloride 106 101 - 111 mmol/L   CO2 22 22 - 32 mmol/L   Glucose, Bld 95 65 - 99 mg/dL   BUN 19 6 - 20 mg/dL   Creatinine, Ser 0.82 0.44 - 1.00 mg/dL   Calcium 8.9 8.9 - 10.3 mg/dL   GFR calc non Af Amer >60 >60 mL/min   GFR calc Af Amer >60 >60 mL/min   Anion gap 8 5 - 15  CBC  Result Value Ref Range   WBC 9.5 3.6 - 11.0 K/uL   RBC 4.80 3.80 - 5.20 MIL/uL   Hemoglobin 14.2 12.0 - 16.0 g/dL   HCT 41.0 35.0 - 47.0 %   MCV 85.3 80.0 - 100.0 fL   MCH 29.6 26.0 - 34.0 pg   MCHC 34.7 32.0 - 36.0 g/dL   RDW 13.2 11.5 - 14.5 %   Platelets 259 150 - 440 K/uL  Troponin I  Result Value Ref Range   Troponin I <0.03 <0.03 ng/mL  Urinalysis, Complete w  Microscopic  Result Value Ref Range   Color, Urine YELLOW (A) YELLOW   APPearance CLOUDY (A) CLEAR   Specific Gravity, Urine 1.012 1.005 - 1.030   pH 6.0 5.0 - 8.0   Glucose, UA NEGATIVE NEGATIVE mg/dL   Hgb urine dipstick NEGATIVE NEGATIVE   Bilirubin Urine NEGATIVE NEGATIVE   Ketones, ur NEGATIVE NEGATIVE mg/dL   Protein, ur NEGATIVE NEGATIVE mg/dL   Nitrite NEGATIVE NEGATIVE   Leukocytes, UA NEGATIVE NEGATIVE   RBC / HPF NONE SEEN 0 - 5 RBC/hpf   WBC, UA 0-5 0 - 5 WBC/hpf   Bacteria, UA NONE SEEN NONE SEEN   Squamous Epithelial / LPF 0-5 (A) NONE SEEN  Hepatic function panel  Result Value Ref Range   Total Protein 7.0 6.5 - 8.1 g/dL   Albumin 4.1 3.5 - 5.0 g/dL   AST 18 15 - 41 U/L   ALT 16 14 - 54 U/L   Alkaline Phosphatase 56 38 - 126 U/L   Total Bilirubin 0.5 0.3 - 1.2 mg/dL   Bilirubin, Direct <0.1 (L) 0.1 - 0.5 mg/dL   Indirect Bilirubin NOT CALCULATED 0.3 - 0.9 mg/dL  Lipase, blood  Result Value Ref Range   Lipase 44 11 - 51 U/L  hCG, quantitative, pregnancy  Result Value Ref Range   hCG, Beta Chain, Quant, S <1 <5 mIU/mL   Ct Renal Stone Study  Result Date: 02/22/2017 CLINICAL DATA:  Right-sided flank pain for 1 day. EXAM: CT ABDOMEN AND PELVIS WITHOUT CONTRAST TECHNIQUE: Multidetector CT imaging of the abdomen and pelvis was performed following the standard protocol without IV contrast. COMPARISON:  None. FINDINGS: Lower chest: No acute findings. Hepatobiliary: No mass visualized on this unenhanced exam. Gallbladder is unremarkable. Pancreas: No mass or inflammatory process visualized on this unenhanced exam. Spleen:  Within normal limits in size. Adrenals/Urinary tract: No evidence of urolithiasis or hydronephrosis. Unremarkable unopacified urinary bladder. Stomach/Bowel: No evidence of obstruction, inflammatory process, or abnormal fluid collections. Normal appendix visualized. Vascular/Lymphatic: No pathologically enlarged lymph nodes identified. No evidence of  abdominal aortic aneurysm. Reproductive:  No mass or other significant abnormality. Other:  None. Musculoskeletal:  No suspicious bone lesions identified. IMPRESSION: No evidence of urolithiasis, hydronephrosis, or other significant abnormality. Electronically Signed   By: Earle Gell M.D.   On: 02/22/2017 23:59   US Abdomen Limited Ruq  Result Date: 02/22/2017 CLINICAL DATA:  Right flank and right upper quadrant abdominal pain. EXAM: ULTRASOUND ABDOMEN LIMITED RIGHT UPPER QUADRANT COMPARISON:  None. FINDINGS: Gallbladder: No gallstones or wall thickening visualized. No sonographic Murphy sign noted by sonographer. Common bile duct: Diameter: 2 mm Liver: No focal lesion identified. Within normal limits in parenchymal echogenicity. Portal vein is patent on color Doppler imaging with normal direction of blood flow towards the liver. IMPRESSION: No cholelithiasis or sonographic evidence for acute cholecystitis. Electronically Signed   By: Lovey Newcomer M.D.   On: 02/22/2017 20:32

## 2017-02-24 DIAGNOSIS — M5416 Radiculopathy, lumbar region: Secondary | ICD-10-CM | POA: Diagnosis not present

## 2017-02-24 DIAGNOSIS — M546 Pain in thoracic spine: Secondary | ICD-10-CM | POA: Diagnosis not present

## 2017-02-24 DIAGNOSIS — R1013 Epigastric pain: Secondary | ICD-10-CM | POA: Diagnosis not present

## 2017-03-27 DIAGNOSIS — R1013 Epigastric pain: Secondary | ICD-10-CM | POA: Diagnosis not present

## 2017-03-27 DIAGNOSIS — G2581 Restless legs syndrome: Secondary | ICD-10-CM | POA: Diagnosis not present

## 2017-03-27 DIAGNOSIS — F411 Generalized anxiety disorder: Secondary | ICD-10-CM | POA: Diagnosis not present

## 2017-03-27 DIAGNOSIS — M5416 Radiculopathy, lumbar region: Secondary | ICD-10-CM | POA: Diagnosis not present

## 2017-04-22 ENCOUNTER — Ambulatory Visit (INDEPENDENT_AMBULATORY_CARE_PROVIDER_SITE_OTHER): Payer: 59 | Admitting: Neurology

## 2017-04-22 DIAGNOSIS — G43719 Chronic migraine without aura, intractable, without status migrainosus: Secondary | ICD-10-CM

## 2017-04-22 MED ORDER — ONABOTULINUMTOXINA 100 UNITS IJ SOLR
155.0000 [IU] | Freq: Once | INTRAMUSCULAR | Status: AC
  Administered 2017-04-22: 155 [IU] via INTRAMUSCULAR

## 2017-04-22 MED ORDER — TOPIRAMATE 100 MG PO TABS: 100.0000 mg | ORAL_TABLET | Freq: Every day | ORAL | 3 refills | Status: DC

## 2017-04-22 NOTE — Addendum Note (Signed)
Addended by: Clois Comber on: 04/22/2017 04:26 PM   Modules accepted: Orders

## 2017-04-22 NOTE — Progress Notes (Signed)
[facility]   Procedure Note Botox  Attending: Dr. Esai Stecklein  Preoperative Diagnosis(es): Chronic migraine  Consent obtained from: The patient. Benefits discussed included, but were not limited to decreased muscle tightness, increased joint range of motion, and decreased pain.  Risk discussed included, but were not limited pain and discomfort, bleeding, bruising, excessive weakness, venous thrombosis, muscle atrophy and dysphagia.  Anticipated outcomes of the procedure as well as he risks and benefits of the alternatives to the procedure, and the roles and tasks of the personnel to be involved, were discussed with the patient, and the patient consents to the procedure and agrees to proceed. A copy of the patient medication guide was given to the patient which explains the blackbox warning.  Patients identity and treatment sites confirmed:  Yes.  Details of Procedure: Skin was cleaned with alcohol. Prior to injection, the needle plunger was aspirated to make sure the needle was not within a blood vessel.  There was no blood retrieved on aspiration.    Following is a summary of the muscles injected  And the amount of Botulinum toxin used:  Dilution 200 units of Botox was reconstituted with 4 ml of preservative free normal saline. Time of reconstitution: At the time of the office visit (<30 minutes prior to injection)   Injections  155 total units of Botox was injected with a 30 gauge needle.  Injection Sites: L occipitalis: 15 units- 3 sites  R occiptalis: 15 units- 3 sites  L upper trapezius: 15 units- 3 sites R upper trapezius: 15 units- 3 sits          L paraspinal: 10 units- 2 sites R paraspinal: 10 units- 2 sites  Face L frontalis(2 injection sites):10 units   R frontalis(2 injection sites):10 units         L corrugator: 5 units   R corrugator: 5 units           Procerus: 5 units   L temporalis: 20 units R temporalis: 20 units   Agent:  200 units of botulinum Type A  (Onobotulinum Toxin type A) was reconstituted with 4 ml of preservative free normal saline.  Time of reconstitution: At the time of the office visit (<30 minutes prior to injection)     Total injected (Units): 155  Total wasted (Units): 45  Patient tolerated procedure well without complications.   Reinjection is anticipated in 3 months. Return to clinic in 4.5 months.   

## 2017-04-22 NOTE — Progress Notes (Signed)
To further reduce frequency of migraines, will increase topiramate from 75mg  to 100mg  at bedtime

## 2017-04-24 ENCOUNTER — Other Ambulatory Visit: Payer: Self-pay | Admitting: Neurology

## 2017-04-24 DIAGNOSIS — G43009 Migraine without aura, not intractable, without status migrainosus: Secondary | ICD-10-CM | POA: Diagnosis not present

## 2017-04-24 DIAGNOSIS — G2581 Restless legs syndrome: Secondary | ICD-10-CM | POA: Diagnosis not present

## 2017-04-24 DIAGNOSIS — Z131 Encounter for screening for diabetes mellitus: Secondary | ICD-10-CM | POA: Diagnosis not present

## 2017-04-24 DIAGNOSIS — F411 Generalized anxiety disorder: Secondary | ICD-10-CM | POA: Diagnosis not present

## 2017-05-27 ENCOUNTER — Encounter: Payer: Self-pay | Admitting: Neurology

## 2017-07-09 ENCOUNTER — Telehealth: Payer: Self-pay | Admitting: Neurology

## 2017-07-09 DIAGNOSIS — G43719 Chronic migraine without aura, intractable, without status migrainosus: Secondary | ICD-10-CM

## 2017-07-09 MED ORDER — PREDNISONE 10 MG (21) PO TBPK: ORAL_TABLET | ORAL | 0 refills | Status: DC

## 2017-07-09 NOTE — Telephone Encounter (Signed)
Patient lmom regarding a headache and needing Prednisone called in for her? Please Call. Thank you

## 2017-07-09 NOTE — Telephone Encounter (Signed)
Rx called in to [place], spoke to [place]

## 2017-07-09 NOTE — Telephone Encounter (Signed)
Patient ins has changed and she called to let us know because she has BOTOX on July 3 with [facility].. New ins information is   [facility] Value  ID [id number]  Group  [id number]  Telephone number [phone]    She will have to bring in the card when she comes in she could not fax the card.   She also has had a Migraine since Saturday and wants [place] to call her in a RX for some predinsone  She can not call in the Medrol Dose Pack. Please call it into the [facility] at [phone]

## 2017-07-09 NOTE — Telephone Encounter (Signed)
Okay to send prescription for prednisone taper.

## 2017-07-09 NOTE — Telephone Encounter (Signed)
Called Pt, advised her Rx was called in, and spoke with Estill Bamberg.

## 2017-07-30 ENCOUNTER — Ambulatory Visit (INDEPENDENT_AMBULATORY_CARE_PROVIDER_SITE_OTHER): Payer: BLUE CROSS/BLUE SHIELD | Admitting: Neurology

## 2017-07-30 DIAGNOSIS — G43719 Chronic migraine without aura, intractable, without status migrainosus: Secondary | ICD-10-CM

## 2017-07-30 DIAGNOSIS — G43009 Migraine without aura, not intractable, without status migrainosus: Secondary | ICD-10-CM | POA: Diagnosis not present

## 2017-07-30 MED ORDER — SUMATRIPTAN SUCCINATE 6 MG/0.5ML ~~LOC~~ SOCT: SUBCUTANEOUS | 5 refills | Status: DC

## 2017-07-30 MED ORDER — ONABOTULINUMTOXINA 100 UNITS IJ SOLR
200.0000 [IU] | Freq: Once | INTRAMUSCULAR | Status: AC
  Administered 2017-07-30: 200 [IU] via INTRAMUSCULAR

## 2017-07-30 NOTE — Progress Notes (Signed)
Sumatriptan injection not helping migraines. She will still use sumatriptan injection as first line.  May repeat dose once after 1 hour.  If migraine still persists after 30 minutes, then I will have her try the Sprix spray as a second line.

## 2017-07-30 NOTE — Progress Notes (Signed)
[facility]   Procedure Note Botox  Attending: Dr. Tomi Likens  Preoperative Diagnosis(es): Chronic migraine  Consent obtained from: The patient Benefits discussed included, but were not limited to decreased muscle tightness, increased joint range of motion, and decreased pain.  Risk discussed included, but were not limited pain and discomfort, bleeding, bruising, excessive weakness, venous thrombosis, muscle atrophy and dysphagia.  Anticipated outcomes of the procedure as well as he risks and benefits of the alternatives to the procedure, and the roles and tasks of the personnel to be involved, were discussed with the patient, and the patient consents to the procedure and agrees to proceed. A copy of the patient medication guide was given to the patient which explains the blackbox warning.  Patients identity and treatment sites confirmed Yes.  .  Details of Procedure: Skin was cleaned with alcohol. Prior to injection, the needle plunger was aspirated to make sure the needle was not within a blood vessel.  There was no blood retrieved on aspiration.    Following is a summary of the muscles injected  And the amount of Botulinum toxin used:  Dilution 200 units of Botox was reconstituted with 4 ml of preservative free normal saline. Time of reconstitution: At the time of the office visit (<30 minutes prior to injection)   Injections  155 total units of Botox was injected with a 30 gauge needle.  Injection Sites: L occipitalis: 15 units- 3 sites  R occiptalis: 15 units- 3 sites  L upper trapezius: 15 units- 3 sites R upper trapezius: 15 units- 3 sits          L paraspinal: 10 units- 2 sites R paraspinal: 10 units- 2 sites  Face L frontalis(2 injection sites):10 units   R frontalis(2 injection sites):10 units         L corrugator: 5 units   R corrugator: 5 units           Procerus: 5 units   L temporalis: 20 units R temporalis: 20 units   Agent:  200 units of botulinum Type A  (Onobotulinum Toxin type A) was reconstituted with 4 ml of preservative free normal saline.  Time of reconstitution: At the time of the office visit (<30 minutes prior to injection)     Total injected (Units): 155  Total wasted (Units): 45  Patient tolerated procedure well without complications.   Reinjection is anticipated in 3 months. Return to clinic in 6 weeks

## 2017-08-29 ENCOUNTER — Other Ambulatory Visit: Payer: Self-pay | Admitting: Neurology

## 2017-09-03 ENCOUNTER — Encounter: Payer: Self-pay | Admitting: Neurology

## 2017-09-03 ENCOUNTER — Ambulatory Visit: Payer: BLUE CROSS/BLUE SHIELD | Admitting: Neurology

## 2017-09-03 VITALS — BP 102/76 | HR 72 | Ht 65.0 in | Wt 233.0 lb

## 2017-09-03 DIAGNOSIS — G43719 Chronic migraine without aura, intractable, without status migrainosus: Secondary | ICD-10-CM | POA: Diagnosis not present

## 2017-09-03 DIAGNOSIS — G2581 Restless legs syndrome: Secondary | ICD-10-CM

## 2017-09-03 MED ORDER — SUMATRIPTAN SUCCINATE 6 MG/0.5ML ~~LOC~~ SOCT: SUBCUTANEOUS | 5 refills | Status: DC

## 2017-09-03 NOTE — Patient Instructions (Signed)
1.  Continue Botox and topiramate 2.  At earliest onset of migraine, take sumatriptan shot.  May repeat once after 1 hour if needed.  If not improved in 30 minutes, then may take the Sprix nasal spray as directed. 3.  Follow up for next Botox

## 2017-09-03 NOTE — Progress Notes (Signed)
NEUROLOGY FOLLOW UP OFFICE NOTE  Bridget Beck [id number]  HISTORY OF PRESENT ILLNESS: Bridget Beck is a 47 year old right-handed woman with depression who follows up for migraine.   UPDATE: As headaches persisted for a couple of days, she was advised to stop sumatriptan tablet and take the injection as first line and Sprix nasal spray as second line.  However, she ran out of the injection and has just taken Sprix.   Intensity:  6/10 Duration:  Goes to sleep and headache gone when she wakes up. Frequency:  Approximately, once a month (triggered by change in weather or menses usually) Current NSAIDS:  Sprix nasal spray Current analgesics:  Oxycodone (for CRPS/back pain) Current triptans:  no Current anti-emetic:  promethazine 25mg  Current muscle relaxants:  no Current anti-anxiolytic:  no Current sleep aide:  no Current Antihypertensive medications:  no Current Antidepressant medications:  no Current Anticonvulsant medications:  topiramate 100mg , Lyrica 75mg  twice daily (for CRPS/back pain) Current Vitamins/Herbal/Supplements:  no Current Antihistamines/Decongestants:  no Other therapy:  Botox   Once in awhile, she wakes up with aching, swelling and paresthesias of her face.   HISTORY: Onset:  Since childhood. Location:  Usually left sided (rarely right-sided or holocephalic) Quality:  pounding Initial intensity:  10/10 Aura:  no Associated symptoms:  Nausea, photophobia, phonophobia, osmophobia, difficulty seeing (cannot elaborate), talks jibberish, sometimes vomiting Initial duration:  Several hours but dull residual headache for a day or two afterwards Initial frequency:  25 days per month Triggers/exacerbating factors:  Peanuts, chocolate, smoked foods, alcohol, change in weather, menstrual cycle Relieving factors:  none Activity:  Needs to lay down in dark quite room   Past abortive therapy:  [name], sumatriptan 4mg  Bridget Beck, ibuprofen  (takes edge off), Bridget Beck Past preventative therapy:  Depakote (side effects), propranolol (unsure of dose.  ineffective), amitriptyline 25mg  (triggered headache), zonisamide 600mg  (stopped being effective)   Regarding restless leg:  Mirapex caused headaches.  She was started on ropinirole 0.25mg .  This has been effective.  PAST MEDICAL HISTORY: Past Medical History:  Diagnosis Date  . Depression    Managed  . GERD (gastroesophageal reflux disease)   . Migraines    with aura    MEDICATIONS: Current Outpatient Medications on File Prior to Visit  Medication Sig Dispense Refill  . diclofenac (VOLTAREN) 75 MG EC tablet Take 1 tablet (75 mg total) by mouth 2 (two) times daily. (Patient not taking: Reported on 02/21/2017) 30 tablet 0  . gabapentin (NEURONTIN) 300 MG capsule Take 1 capsule (300 mg total) by mouth at bedtime. (Patient not taking: Reported on 02/21/2017) 90 capsule 1  . gabapentin (NEURONTIN) 400 MG capsule Take 400 mg by mouth 4 (four) times daily.    Marland Kitchen lidocaine (LIDODERM) 5 % Place 1 patch onto the skin daily. Remove & Discard patch within 12 hours or as directed by MD 30 patch 0  . LYRICA 75 MG capsule Take 75 mg by mouth 2 (two) times daily.  5  . meloxicam (MOBIC) 15 MG tablet Take 1 tablet (15 mg total) by mouth daily. (Patient not taking: Reported on 02/21/2017) 30 tablet 0  . naproxen (NAPROSYN) 500 MG tablet Take 1 tablet with sumatriptan tablet 20 tablet 2  . oxyCODONE-acetaminophen (PERCOCET) 7.5-325 MG tablet Take 1 tablet by mouth every 6 (six) hours as needed for severe pain. 7 tablet 0  . predniSONE (STERAPRED UNI-PAK 21 TAB) 10 MG (21) TBPK tablet Take as instructed (Patient not  taking: Reported on 02/21/2017) 21 tablet 0  . predniSONE (STERAPRED UNI-PAK 21 TAB) 10 MG (21) TBPK tablet As directed (Patient not taking: Reported on 09/03/2017) 21 tablet 0  . promethazine (PHENERGAN) 25 MG tablet Take 1 tablet (25 mg total) by mouth every 6 (six) hours as needed for  nausea. 40 tablet 3  . topiramate (TOPAMAX) 100 MG tablet TAKE 1 TABLET BY MOUTH EVERY NIGHT AT BEDTIME 30 tablet 5  . traMADol (ULTRAM) 50 MG tablet Take 1 tablet (50 mg total) by mouth every 6 (six) hours as needed. (Patient not taking: Reported on 09/03/2017) 20 tablet 0  . valACYclovir (VALTREX) 1000 MG tablet Take 2 tablets (2,000 mg total) by mouth 2 (two) times daily. For 1 day.  Repeat as needed with future episodes. 8 tablet 1   Current Facility-Administered Medications on File Prior to Visit  Medication Dose Route Frequency Provider Last Rate Last Dose  . 0.9 %  sodium chloride infusion  500 mL Intravenous Continuous [name], [name], MD      . triamcinolone acetonide (KENALOG-40) injection 20 mg  20 mg Other Once [name], [name]        ALLERGIES: Allergies  Allergen Reactions  . Pramipexole Other (See Comments)  . Amitriptyline Other (See Comments)    Had severe migraine and pt could not see for 2 hours.  . Fluoxetine Hcl Other (See Comments)    Fatigue  . Lyrica [Pregabalin]     Migraine  . Methocarbamol Other (See Comments)    Stomach upset Stomach upset  . Mirtazapine Other (See Comments)    "felt like a zombie" "felt like a zombie"  . Prozac [Fluoxetine Hcl]     Fatigue     FAMILY HISTORY: Family History  Problem Relation Age of Onset  . Peripheral vascular disease Father   . Bone cancer Father   . Stroke Sister        possible CVA  . Diabetes Mother        type II, diet controlled  . Cancer Maternal Aunt        non hodgkins lymphoma  . Heart disease Maternal Grandmother   . Breast cancer Maternal Aunt     SOCIAL HISTORY: Social History   Socioeconomic History  . Marital status: Married    Spouse name: Not on file  . Number of children: 3  . Years of education: Not on file  . Highest education level: Not on file  Occupational History  . Occupation: CMA    Employer:  [employer]  . Financial resource strain: Not on file    . Food insecurity:    Worry: Not on file    Inability: Not on file  . Transportation needs:    Medical: Not on file    Non-medical: Not on file  Tobacco Use  . Smoking status: Former Smoker    Packs/day: 0.10    Years: 3.00    Pack years: 0.30    Types: Cigarettes    Last attempt to quit: 10/28/2012    Years since quitting: 4.8  . Smokeless tobacco: Former Systems developer    Quit date: 11/10/2012  Substance and Sexual Activity  . Alcohol use: No    Alcohol/week: 0.0 oz  . Drug use: No  . Sexual activity: Yes    Partners: Male  Lifestyle  . Physical activity:    Days per week: Not on file    Minutes per session: Not on file  .  Stress: Not on file  Relationships  . Social connections:    Talks on phone: Not on file    Gets together: Not on file    Attends religious service: Not on file    Active member of club or organization: Not on file    Attends meetings of clubs or organizations: Not on file    Relationship status: Not on file  . Intimate partner violence:    Fear of current or ex partner: Not on file    Emotionally abused: Not on file    Physically abused: Not on file    Forced sexual activity: Not on file  Other Topics Concern  . Not on file  Social History Narrative   2 years of college.   Former CMA at [employer]   1st husband died from [employer] in May 21, 2010   Remarried    3 kids    REVIEW OF SYSTEMS: Constitutional: No fevers, chills, or sweats, no generalized fatigue, change in appetite Eyes: No visual changes, double vision, eye pain Ear, nose and throat: No hearing loss, ear pain, nasal congestion, sore throat Cardiovascular: No chest pain, palpitations Respiratory:  No shortness of breath at rest or with exertion, wheezes GastrointestinaI: No nausea, vomiting, diarrhea, abdominal pain, fecal incontinence Genitourinary:  No dysuria, urinary retention or frequency Musculoskeletal:  No neck pain, back pain Integumentary: No rash, pruritus, skin  lesions Neurological: as above Psychiatric: No depression, insomnia, anxiety Endocrine: No palpitations, fatigue, diaphoresis, mood swings, change in appetite, change in weight, increased thirst Hematologic/Lymphatic:  No purpura, petechiae. Allergic/Immunologic: no itchy/runny eyes, nasal congestion, recent allergic reactions, rashes  PHYSICAL EXAM: Vitals:   09/03/17 1329  BP: 102/76  Pulse: 72  SpO2: 97%   General: No acute distress.  Patient appears well-groomed.   Head:  Normocephalic/atraumatic Eyes:  Fundi examined but not visualized Neck: supple, no paraspinal tenderness, full range of motion Heart:  Regular rate and rhythm Lungs:  Clear to auscultation bilaterally Back: No paraspinal tenderness Neurological Exam: alert and oriented to person, place, and time. Attention span and concentration intact, recent and remote memory intact, fund of knowledge intact.  Speech fluent and not dysarthric, language intact.  CN II-XII intact. Bulk and tone normal, muscle strength 5/5 throughout.  Sensation to light touch  intact.  Deep tendon reflexes 2+ throughout.  Finger to nose testing intact.  Gait normal, Romberg negative.  IMPRESSION: Migraine without aura, without status migrainosus, not intractable Restless leg syndrome  PLAN: 1.  Continue Botox every 3 months and topiramate 100mg  at bedtime for preventative 2.  Earliest onset of migraine, sumatriptan injection as first line.  May repeat dose once after 1 hour.  If migraine still persists after 30 minutes, then I will have her try the Sprix spray as a second line. 3.  Limit pain relievers to no more than 2 days out of week to prevent rebound headache 4.  Headache diary 5.  Ropinirole 0.25mg  at bedtime 6.  Follow up for next Botox.  Metta Clines, DO  CC:  Leonides Cave, PA-C

## 2017-10-08 ENCOUNTER — Telehealth: Payer: Self-pay | Admitting: Neurology

## 2017-10-08 NOTE — Telephone Encounter (Signed)
Called and spoke with Pt, she is coming 11/22

## 2017-10-08 NOTE — Telephone Encounter (Signed)
Patient called trying to get a Botox appt before December. She had her last injection on 07/30/17. Is there a work in spot for her? Our schedule is out until December. Her number is [phone]. Thanks!

## 2017-12-11 ENCOUNTER — Encounter: Payer: Self-pay | Admitting: *Deleted

## 2017-12-11 NOTE — Progress Notes (Signed)
Approved 2 vials [id number] GOV#[id number] valid 12/10/17-06/08/2018

## 2017-12-18 ENCOUNTER — Telehealth: Payer: Self-pay | Admitting: Neurology

## 2017-12-18 NOTE — Telephone Encounter (Signed)
Patient is changing insurance is no longer able to see [facility] and asked if you can Referrer her to [facility] Dr. Parks Ranger can do Botox. Please call her back at [phone]. Thanks!

## 2017-12-19 ENCOUNTER — Ambulatory Visit: Payer: Self-pay | Admitting: Neurology

## 2017-12-31 ENCOUNTER — Other Ambulatory Visit (HOSPITAL_COMMUNITY)
Admission: RE | Admit: 2017-12-31 | Discharge: 2017-12-31 | Disposition: A | Discharge status: Home / Self Care | Payer: BLUE CROSS/BLUE SHIELD | Source: Ambulatory Visit | Attending: Obstetrics and Gynecology | Admitting: Obstetrics and Gynecology

## 2017-12-31 ENCOUNTER — Encounter: Payer: Self-pay | Admitting: Obstetrics and Gynecology

## 2017-12-31 ENCOUNTER — Ambulatory Visit (INDEPENDENT_AMBULATORY_CARE_PROVIDER_SITE_OTHER): Payer: BLUE CROSS/BLUE SHIELD | Admitting: Obstetrics and Gynecology

## 2017-12-31 VITALS — BP 130/82 | HR 83 | Ht 63.0 in | Wt 238.0 lb

## 2017-12-31 DIAGNOSIS — Z30430 Encounter for insertion of intrauterine contraceptive device: Secondary | ICD-10-CM

## 2017-12-31 DIAGNOSIS — Z01419 Encounter for gynecological examination (general) (routine) without abnormal findings: Secondary | ICD-10-CM | POA: Diagnosis not present

## 2017-12-31 DIAGNOSIS — Z0000 Encounter for general adult medical examination without abnormal findings: Secondary | ICD-10-CM

## 2017-12-31 DIAGNOSIS — Z1239 Encounter for other screening for malignant neoplasm of breast: Secondary | ICD-10-CM

## 2017-12-31 DIAGNOSIS — Z124 Encounter for screening for malignant neoplasm of cervix: Secondary | ICD-10-CM | POA: Diagnosis not present

## 2017-12-31 DIAGNOSIS — Z9889 Other specified postprocedural states: Secondary | ICD-10-CM

## 2017-12-31 DIAGNOSIS — N393 Stress incontinence (female) (male): Secondary | ICD-10-CM

## 2017-12-31 DIAGNOSIS — N920 Excessive and frequent menstruation with regular cycle: Secondary | ICD-10-CM

## 2017-12-31 DIAGNOSIS — K648 Other hemorrhoids: Secondary | ICD-10-CM

## 2017-12-31 MED ORDER — LEVONORGESTREL 20 MCG/24HR IU IUD: INTRAUTERINE_SYSTEM | Freq: Once | INTRAUTERINE | Status: AC

## 2017-12-31 NOTE — Progress Notes (Signed)
Gynecology Annual Exam  PCP: Nathanial Millman, [place]  Chief Complaint:  Chief Complaint  Patient presents with  . Gynecologic Exam    Chronic migraine wants to not have a period because it triggers migraine     History of Present Illness: Patient is a 47 y.o. [id number] presents for annual exam. The patient has no complaints today.   LMP: No LMP recorded. Patient is premenopausal. Average Interval: regular, 28 days Duration of flow: 3-7 days Heavy Menses: some heavy some light, variable per patient Clots: small Intermenstrual Bleeding: no Postcoital Bleeding: no Dysmenorrhea: yes but improves with NSAID use, controlled. Severe migraines for 3 days usually during ehr periods. Would like    The patient is sexually active. She currently uses IUD for contraception. She denies dyspareunia.  The patient does not perform self breast exams.  There is no notable family history of breast or ovarian cancer in her family.  The patient wears seatbelts: yes.   The patient has regular exercise: no.    The patient denies current symptoms of depression.    Review of Systems: ROS  Past Medical History:  Past Medical History:  Diagnosis Date  . Depression    Managed  . GERD (gastroesophageal reflux disease)   . Hemorrhoids   . Migraines    with aura    Past Surgical History:  Past Surgical History:  Procedure Laterality Date  . CERVICAL FUSION     x 2  . INCONTINENCE SURGERY  2004  . TUBAL LIGATION  2002    Gynecologic History:  No LMP recorded. Patient is premenopausal. Contraception: IUD Last Pap: Results were: 2015 no abnormalities  Last mammogram: 2015 Results were: normal per patient Obstetric History: [id number]  Family History:  Family History  Problem Relation Age of Onset  . Peripheral vascular disease Father   . Bone cancer Father   . Stroke Sister        possible CVA  . Diabetes Mother        type II, diet controlled  . Cancer Maternal Aunt        non  hodgkins lymphoma  . Heart disease Maternal Grandmother   . Breast cancer Maternal Aunt     Social History:  Social History   Socioeconomic History  . Marital status: Married    Spouse name: Not on file  . Number of children: 3  . Years of education: Not on file  . Highest education level: Not on file  Occupational History  . Occupation: CMA    Employer:  [employer]  . Financial resource strain: Not on file  . Food insecurity:    Worry: Not on file    Inability: Not on file  . Transportation needs:    Medical: Not on file    Non-medical: Not on file  Tobacco Use  . Smoking status: Former Smoker    Packs/day: 0.10    Years: 3.00    Pack years: 0.30    Types: Cigarettes    Last attempt to quit: 10/28/2012    Years since quitting: 5.1  . Smokeless tobacco: Former Systems developer    Quit date: 11/10/2012  Substance and Sexual Activity  . Alcohol use: No    Alcohol/week: 0.0 standard drinks  . Drug use: No  . Sexual activity: Yes    Partners: Male    Birth control/protection: Surgical    Comment: Tubal  Lifestyle  . Physical activity:    Days per  week: Not on file    Minutes per session: Not on file  . Stress: Not on file  Relationships  . Social connections:    Talks on phone: Not on file    Gets together: Not on file    Attends religious service: Not on file    Active member of club or organization: Not on file    Attends meetings of clubs or organizations: Not on file    Relationship status: Not on file  . Intimate partner violence:    Fear of current or ex partner: Not on file    Emotionally abused: Not on file    Physically abused: Not on file    Forced sexual activity: Not on file  Other Topics Concern  . Not on file  Social History Narrative   2 years of college.   Former CMA at [employer]   1st husband died from [employer] in 14-May-2010   Remarried    3 kids    Allergies:  Allergies  Allergen Reactions  . Pramipexole Other (See Comments)    . Amitriptyline Other (See Comments)    Had severe migraine and pt could not see for 2 hours.  . Fluoxetine Hcl Other (See Comments)    Fatigue  . Lyrica [Pregabalin]     Migraine  . Methocarbamol Other (See Comments)    Stomach upset Stomach upset  . Mirtazapine Other (See Comments)    "felt like a zombie" "felt like a zombie"  . Prozac [Fluoxetine Hcl]     Fatigue     Medications: Prior to Admission medications   Medication Sig Start Date End Date Taking? Authorizing Provider  busPIRone (BUSPAR) 10 MG tablet TK 1 T PO BID. TAKE IN ADDITION TO CYMBALTA. 12/17/17  Yes [provider]  citalopram (CELEXA) 40 MG tablet  12/23/17  Yes [provider]  DULoxetine (CYMBALTA) 60 MG capsule  12/30/17  Yes [provider]  Ketorolac Tromethamine (SPRIX) 15.75 MG/SPRAY SOLN Place into the nose.   Yes [provider]  lidocaine (LIDODERM) 5 % Place 1 patch onto the skin daily. Remove & Discard patch within 12 hours or as directed by MD 10/04/16  Yes Stover, Titorya, [name]  LYRICA 75 MG capsule Take 75 mg by mouth 2 (two) times daily. 08/15/17  Yes [provider]  naproxen (NAPROSYN) 500 MG tablet Take 1 tablet with sumatriptan tablet 08/06/16  Yes Jaffe, Adam R, DO  pantoprazole (PROTONIX) 40 MG tablet TK 1 T PO BID 30 MINUTES BEFORE MEALS 11/12/17  Yes [provider]  promethazine (PHENERGAN) 25 MG tablet Take 1 tablet (25 mg total) by mouth every 6 (six) hours as needed for nausea. 02/05/16  Yes Tomi Likens, Adam R, DO  SUMAtriptan Succinate 6 MG/0.5ML SOCT Inject 6 Mg into skin at earliest onset of migraine, may repeat in 1 hour if headache persists, or reoccurs 09/03/17  Yes Jaffe, Adam R, DO  topiramate (TOPAMAX) 100 MG tablet TAKE 1 TABLET BY MOUTH EVERY NIGHT AT BEDTIME 08/29/17  Yes Jaffe, Adam R, DO  oxyCODONE-acetaminophen (PERCOCET) 7.5-325 MG tablet Take 1 tablet by mouth every 6 (six) hours as needed for severe pain. Patient not taking:  Reported on 12/31/2017 02/23/17 02/23/18  Darel Hong, MD  tiZANidine (ZANAFLEX) 2 MG tablet TK 1 T PO Q NIGHT 12/10/17   [provider]  valACYclovir (VALTREX) 1000 MG tablet Take 2 tablets (2,000 mg total) by mouth 2 (two) times daily. For 1 day.  Repeat as needed with  future episodes. Patient not taking: Reported on 12/31/2017 05/25/12   Tonia Ghent, MD    Physical Exam Vitals: Blood pressure 130/82, pulse 83, height 5\' 3"  (1.6 m), weight 238 lb (108 kg).  Physical Exam  Constitutional: She is oriented to person, place, and time. She appears well-developed.  Genitourinary: Vagina normal and uterus normal. There is no lesion on the right labia. There is no lesion on the left labia. Vagina exhibits no lesion. Right adnexum does not display mass. Left adnexum does not display mass. Cervix does not exhibit motion tenderness.  HENT:  Head: Normocephalic and atraumatic.  Eyes: EOM are normal.  Neck: Neck supple. No thyromegaly present.  Cardiovascular: Normal rate, regular rhythm and normal heart sounds.  Pulmonary/Chest: Effort normal and breath sounds normal. Right breast exhibits no inverted nipple, no mass, no nipple discharge and no skin change. Left breast exhibits no inverted nipple, no mass, no nipple discharge and no skin change.  Abdominal: Soft. Bowel sounds are normal. She exhibits no distension and no mass.  Neurological: She is alert and oriented to person, place, and time.  Skin: Skin is warm and dry.  Psychiatric: She has a normal mood and affect. Her behavior is normal. Judgment and thought content normal.  Vitals reviewed. cervical bump on anterior cervix.   IUD PROCEDURE NOTE:  ZIAIRE BIESER is a 47 y.o. [id number] here for IUD insertion. No GYN concerns.  Last pap smear was normal.  IUD Insertion Procedure Note Patient identified, informed consent performed, consent signed.   Discussed risks of irregular bleeding, cramping, infection, malpositioning or  misplacement of the IUD outside the uterus which may require further procedure such as laparoscopy, risk of failure <1%. Time out was performed.  Urine pregnancy test negative.  A bimanual exam showed the uterus to be anteverted.  Speculum placed in the vagina.  Cervix visualized.  Cleaned with Betadine x 2.  Grasped anteriorly with a single tooth tenaculum.  Uterus sounded to 8 cm.   IUD placed per manufacturer's recommendations.  Strings trimmed to 3 cm. Tenaculum was removed, good hemostasis noted.  Patient tolerated procedure well.   Patient was given post-procedure instructions.  She was advised to have backup contraception for one week.  Patient was also asked to check IUD strings periodically and follow up in 4 weeks for IUD check.   Female chaperone present for pelvic and breast  portions of the physical exam  Assessment: 47 y.o. [id number] routine annual exam  Plan: Problem List Items Addressed This Visit    None    Visit Diagnoses    Healthcare maintenance    -  Primary   Menorrhagia with regular cycle       Relevant Medications   levonorgestrel (MIRENA) 20 MCG/24HR IUD (Start on 12/31/2017 10:00 AM)   Screening breast examination       Relevant Orders   MM DIGITAL SCREENING BILATERAL   Screening for cervical cancer       Relevant Orders   Cytology - PAP   Screening for breast cancer       Relevant Orders   MM DIGITAL SCREENING BILATERAL   Stress incontinence of urine       Relevant Orders   Ambulatory referral to Urology   Status post creation of urethral sling by suprapubic approach       Relevant Orders   Ambulatory referral to Urology   Internal hemorrhoid       Relevant Orders   Ambulatory referral to Gastroenterology  1) Mammogram - recommend yearly screening mammogram.  Mammogram Was ordered today  2) STI screening was offered and declined  3) ASCCP guidelines and rational discussed.  Patient opts for every 3 years screening interval  4) Contraception -  Education given regarding options for contraception, including IUD placement.  5) Colonoscopy -- Screening recommended starting at age 33 for average risk individuals, age 73 for individuals deemed at increased risk (including African Americans) and recommended to continue until age 29.  For patient age 42-85 individualized approach is recommended.  Gold standard screening is via colonoscopy, Cologuard screening is an acceptable alternative for patient unwilling or unable to undergo colonoscopy.  "Colorectal cancer screening for average?risk adults: 2018 guideline update from the American Cancer Society"CA: A Cancer Journal for Clinicians: Jun 26, 2016   6) Routine healthcare maintenance including cholesterol, diabetes screening discussed managed by PCP   Referral to urology for hx of bladder sling with recurrence of stress incontinence GI for prolapsing internal hemorrhoids Pap smear today Mammogram ordered IUD inserted for menorrhagia and relief from periods . Periods are a migraine trigger. She understands that this will not suppress ovulation and she may still have headaches. Depot Provera also discussed as a possible option for period control.   Adrian Prows MD [facility] 12/31/17 10:15 AM

## 2018-01-01 LAB — CYTOLOGY - PAP
Diagnosis: NEGATIVE
HPV (WINDOPATH): NOT DETECTED

## 2018-01-02 NOTE — Telephone Encounter (Signed)
Called Pt and advised 

## 2018-01-02 NOTE — Telephone Encounter (Signed)
yes

## 2018-01-02 NOTE — Progress Notes (Signed)
NIL, released to mychart

## 2018-01-11 ENCOUNTER — Telehealth: Payer: BLUE CROSS/BLUE SHIELD | Admitting: Family

## 2018-01-11 DIAGNOSIS — R6889 Other general symptoms and signs: Secondary | ICD-10-CM

## 2018-01-11 DIAGNOSIS — R0602 Shortness of breath: Secondary | ICD-10-CM

## 2018-01-11 DIAGNOSIS — R079 Chest pain, unspecified: Secondary | ICD-10-CM

## 2018-01-11 NOTE — Progress Notes (Signed)
Based on what you shared with me it looks like you have a serious condition that should be evaluated in a face to face office visit.  NOTE: If you entered your credit card information for this eVisit, you will not be charged. You may see a "hold" on your card for the $30 but that hold will drop off and you will not have a charge processed.  If you are having a true medical emergency please call 911.  If you need an urgent face to face visit, [place] has four urgent care centers for your convenience.  If you need care fast and have a high deductible or no insurance consider:   [email or web address] to reserve your spot online an avoid wait times  [facility] [address], [address] [place], [place] [address] 8 am to 8 pm Monday-Friday 10 am to 4 pm Saturday-Sunday *Across the street from [employer]  [facility]  [address] [place], [address] 8 am to 5 pm Monday-Friday * In the [facility] the [facility]   The following sites will take your  insurance:  . [facility]  [phone] Get Driving Directions Find a Provider at this Location  [address] , [place] [address] . 10 am to 8 pm Monday-Friday . 12 pm to 8 pm Saturday-Sunday   . [facility]  [phone] Get Driving Directions Find a Provider at this Location  [address], [address] [place], [place] [address] . 8 am to 8 pm Monday-Friday . 9 am to 6 pm Saturday . 11 am to 6 pm Sunday   . [facility] [place]  [phone] Get Driving Directions  [address] [place], [place] [address] . 8 am to 8 pm Monday-Friday . 8 am to 4 pm Saturday-Sunday   Your e-visit answers were reviewed by a board certified advanced clinical practitioner to complete your personal care plan.  Thank you for using e-Visits.  

## 2018-01-26 ENCOUNTER — Ambulatory Visit (INDEPENDENT_AMBULATORY_CARE_PROVIDER_SITE_OTHER): Payer: BLUE CROSS/BLUE SHIELD | Admitting: Obstetrics and Gynecology

## 2018-01-26 ENCOUNTER — Encounter: Payer: Self-pay | Admitting: Obstetrics and Gynecology

## 2018-01-26 VITALS — BP 118/70 | HR 84 | Ht 63.0 in | Wt 235.0 lb

## 2018-01-26 DIAGNOSIS — Z30431 Encounter for routine checking of intrauterine contraceptive device: Secondary | ICD-10-CM | POA: Diagnosis not present

## 2018-01-26 NOTE — Progress Notes (Signed)
History of Present Illness:  Bridget Beck is a 47 y.o. that had a Mirena IUD placed approximately 1 month ago. Since that time, she states that she has had some bleeding and pain. Daily spotting. Felt that her last period was a little heavier. She noted some left sided pain and cramping. She did not take motrin for this pain because of her migraines she does not like to take these medications. Would like to continue to trial the IUD. Did not have a headache with ehr last period.   PMHx: She  has a past medical history of Depression, GERD (gastroesophageal reflux disease), Hemorrhoids, and Migraines. Also,  has a past surgical history that includes Cervical fusion; Incontinence surgery (2004); and Tubal ligation (2002)., family history includes Bone cancer in her father; Breast cancer in her maternal aunt; Cancer in her maternal aunt; Diabetes in her mother; Heart disease in her maternal grandmother; Peripheral vascular disease in her father; Stroke in her sister.,  reports that she quit smoking about 5 years ago. Her smoking use included cigarettes. She has a 0.30 pack-year smoking history. She quit smokeless tobacco use about 5 years ago. She reports that she does not drink alcohol or use drugs. Current Meds  Medication Sig  . busPIRone (BUSPAR) 10 MG tablet TK 1 T PO BID. TAKE IN ADDITION TO CYMBALTA.  . DULoxetine (CYMBALTA) 30 MG capsule Take by mouth.  . DULoxetine (CYMBALTA) 60 MG capsule   . Ketorolac Tromethamine (SPRIX) 15.75 MG/SPRAY SOLN Place into the nose.  . lidocaine (LIDODERM) 5 % Place 1 patch onto the skin daily. Remove & Discard patch within 12 hours or as directed by MD  . LYRICA 75 MG capsule Take 75 mg by mouth 2 (two) times daily.  . naproxen (NAPROSYN) 500 MG tablet Take 1 tablet with sumatriptan tablet  . pantoprazole (PROTONIX) 40 MG tablet TK 1 T PO BID 30 MINUTES BEFORE MEALS  . promethazine (PHENERGAN) 25 MG tablet Take 1 tablet (25 mg total) by mouth every 6 (six)  hours as needed for nausea.  . SUMAtriptan Succinate 6 MG/0.5ML SOCT Inject 6 Mg into skin at earliest onset of migraine, may repeat in 1 hour if headache persists, or reoccurs  . tiZANidine (ZANAFLEX) 2 MG tablet TK 1 T PO Q NIGHT  . topiramate (TOPAMAX) 100 MG tablet TAKE 1 TABLET BY MOUTH EVERY NIGHT AT BEDTIME  . valACYclovir (VALTREX) 1000 MG tablet Take 2 tablets (2,000 mg total) by mouth 2 (two) times daily. For 1 day.  Repeat as needed with future episodes.   Current Facility-Administered Medications for the 01/26/18 encounter (Office Visit) with Homero Fellers, MD  Medication  . 0.9 %  sodium chloride infusion  . levonorgestrel (MIRENA) 20 MCG/24HR IUD  . triamcinolone acetonide (KENALOG-40) injection 20 mg  .  Also, is allergic to pramipexole; amitriptyline; fluoxetine hcl; lyrica [pregabalin]; methocarbamol; mirtazapine; and prozac [fluoxetine hcl]..  Review of Systems  Constitutional: Negative for chills, fever, malaise/fatigue and weight loss.  HENT: Negative for congestion, hearing loss and sinus pain.   Eyes: Negative for blurred vision and double vision.  Respiratory: Negative for cough, sputum production, shortness of breath and wheezing.   Cardiovascular: Negative for chest pain, palpitations, orthopnea and leg swelling.  Gastrointestinal: Negative for abdominal pain, constipation, diarrhea, nausea and vomiting.  Genitourinary: Negative for dysuria, flank pain, frequency, hematuria and urgency.  Musculoskeletal: Negative for back pain, falls and joint pain.  Skin: Negative for itching and rash.  Neurological: Negative for  dizziness and headaches.  Psychiatric/Behavioral: Negative for depression, substance abuse and suicidal ideas. The patient is not nervous/anxious.     Physical Exam:  BP 118/70   Pulse 84   Ht 5\' 3"  (1.6 m)   Wt 235 lb (106.6 kg)   LMP  (LMP Unknown)   BMI 41.63 kg/m  Body mass index is 41.63 kg/m. Constitutional: Well nourished, well  developed female in no acute distress.  Abdomen: diffusely non tender to palpation, non distended, and no masses, hernias Neuro: Grossly intact Psych:  Normal mood and affect.    Pelvic exam:  Two IUD strings present seen coming from the cervical os. EGBUS, vaginal vault and cervix: within normal limits  Assessment: IUD strings present in proper location; pt doing well  Plan: She was told to continue to use barrier contraception, in order to prevent any STIs, and to take a home pregnancy test or call us if she ever thinks she may be pregnant, and that her IUD expires in 5 years.  She was amenable to this plan and we will see her back in 6 months/PRN.  A total of 15 minutes were spent face-to-face with the patient during this encounter and over half of that time dealt with counseling and coordination of care.  Adrian Prows MD [facility] 01/26/2018 10:25 AM

## 2018-02-16 ENCOUNTER — Ambulatory Visit: Payer: Self-pay | Admitting: Urology

## 2018-03-09 ENCOUNTER — Other Ambulatory Visit: Payer: Self-pay | Admitting: Neurology

## 2018-06-25 NOTE — Progress Notes (Signed)
Rcvd fax from [facility] (dated 05/25/18) YLT#[id number] 12/10/2017 - 10/29/2018 [id number] visits

## 2018-07-01 ENCOUNTER — Telehealth: Payer: Self-pay

## 2018-07-01 NOTE — Telephone Encounter (Signed)
Received Disability forms from [employer].  Per Dr. Tomi Likens, he does not complete disability forms for headaches only FMLA.  Spoke with Joaquim Lai at [employer] and she will inform pts case Freight forwarder.

## 2018-07-28 ENCOUNTER — Ambulatory Visit: Payer: BLUE CROSS/BLUE SHIELD | Admitting: Obstetrics and Gynecology

## 2018-08-28 ENCOUNTER — Emergency Department
Admission: EM | Admit: 2018-08-28 | Discharge: 2018-08-28 | Disposition: A | Discharge status: Home / Self Care | Payer: BLUE CROSS/BLUE SHIELD | Attending: Student in an Organized Health Care Education/Training Program | Admitting: Student in an Organized Health Care Education/Training Program

## 2018-08-28 ENCOUNTER — Other Ambulatory Visit: Payer: Self-pay

## 2018-08-28 ENCOUNTER — Emergency Department: Payer: BLUE CROSS/BLUE SHIELD

## 2018-08-28 DIAGNOSIS — M79605 Pain in left leg: Secondary | ICD-10-CM | POA: Insufficient documentation

## 2018-08-28 DIAGNOSIS — Z79899 Other long term (current) drug therapy: Secondary | ICD-10-CM | POA: Insufficient documentation

## 2018-08-28 DIAGNOSIS — R2242 Localized swelling, mass and lump, left lower limb: Secondary | ICD-10-CM | POA: Insufficient documentation

## 2018-08-28 DIAGNOSIS — Z87891 Personal history of nicotine dependence: Secondary | ICD-10-CM | POA: Insufficient documentation

## 2018-08-28 DIAGNOSIS — M7989 Other specified soft tissue disorders: Secondary | ICD-10-CM

## 2018-08-28 MED ORDER — MELOXICAM 15 MG PO TABS: 15.0000 mg | ORAL_TABLET | Freq: Every day | ORAL | 0 refills | Status: DC

## 2018-08-28 NOTE — Discharge Instructions (Signed)
Please follow-up with your primary care provider for symptoms that are not improving over the weekend.  Return to the emergency department if you develop any chest pain, shortness of breath, or other symptoms of concern.

## 2018-08-28 NOTE — ED Triage Notes (Signed)
Left leg and foot swelling X 1 week. intermittent pain. To back of leg and in calf. Pt alert and oriented X4, active, cooperative, pt in NAD. RR even and unlabored, color WNL.

## 2018-08-28 NOTE — ED Notes (Signed)

## 2018-08-29 NOTE — ED Provider Notes (Signed)
[facility] Emergency Department Provider Note ____________________________________________   First MD Initiated Contact with Patient 08/28/18 1417     (approximate)  I have reviewed the triage vital signs and the nursing notes.   HISTORY  Chief Complaint Leg Swelling  HPI Karmella Bouvier Duley is a 48 y.o. female who presents to the emergency department for treatment and evaluation of left leg and foot swelling for the past week. Pain is intermittent. Her PCP advised her to come to the ER for further evaluation. She has a history of back pain and is currently under care of pain management. She believes pain may be related, but is unsure.   Past Medical History:  Diagnosis Date   Depression    Managed   GERD (gastroesophageal reflux disease)    Hemorrhoids    Migraines    with aura    Patient Active Problem List   Diagnosis Date Noted   Colon cancer screening 05/08/2016   Rectal bleeding 05/08/2016   Achilles tendonitis 04/28/2016   Bowel movement symptom 12/07/2014   Intractable chronic migraine without aura and without status migrainosus 12/05/2014   Weight gain 12/01/2013   Left hip pain 12/01/2013   Migraine without aura 04/20/2013   Depression 04/26/2012   Lumbar back pain with radiculopathy affecting left lower extremity 02/09/2012   Memory changes 10/17/2011    Past Surgical History:  Procedure Laterality Date   CERVICAL FUSION     x 2   INCONTINENCE SURGERY  2004   TUBAL LIGATION  2002    Prior to Admission medications   Medication Sig Start Date End Date Taking? Authorizing Provider  busPIRone (BUSPAR) 10 MG tablet TK 1 T PO BID. TAKE IN ADDITION TO CYMBALTA. 12/17/17   [provider]  DULoxetine (CYMBALTA) 30 MG capsule Take by mouth. 01/08/18 01/08/19  [provider]  DULoxetine (CYMBALTA) 60 MG capsule  12/30/17   [provider]  Ketorolac Tromethamine (SPRIX) 15.75 MG/SPRAY SOLN  Place into the nose.    [provider]  lidocaine (LIDODERM) 5 % Place 1 patch onto the skin daily. Remove & Discard patch within 12 hours or as directed by MD 10/04/16   [name], [name]  LYRICA 75 MG capsule Take 75 mg by mouth 2 (two) times daily. 08/15/17   [provider]  meloxicam (MOBIC) 15 MG tablet Take 1 tablet (15 mg total) by mouth daily. 08/28/18   Benjimen Kelley, Johnette Abraham B, FNP  naproxen (NAPROSYN) 500 MG tablet Take 1 tablet with sumatriptan tablet 08/06/16   Jaffe, Adam R, DO  pantoprazole (PROTONIX) 40 MG tablet TK 1 T PO BID 30 MINUTES BEFORE MEALS 11/12/17   [provider]  promethazine (PHENERGAN) 25 MG tablet Take 1 tablet (25 mg total) by mouth every 6 (six) hours as needed for nausea. 02/05/16   Pieter Partridge, DO  SUMAtriptan Succinate 6 MG/0.5ML SOCT Inject 6 Mg into skin at earliest onset of migraine, may repeat in 1 hour if headache persists, or reoccurs 09/03/17   Tomi Likens, Adam R, DO  tiZANidine (ZANAFLEX) 2 MG tablet TK 1 T PO Q NIGHT 12/10/17   [provider]  topiramate (TOPAMAX) 100 MG tablet TAKE 1 TABLET BY MOUTH EVERY NIGHT AT BEDTIME 03/09/18   Jaffe, Adam R, DO  valACYclovir (VALTREX) 1000 MG tablet Take 2 tablets (2,000 mg total) by mouth 2 (two) times daily. For 1 day.  Repeat as needed with future episodes. 05/25/12   Tonia Ghent, MD  Allergies Pramipexole, Amitriptyline, Fluoxetine hcl, Lyrica [pregabalin], Methocarbamol, Mirtazapine, and Prozac [fluoxetine hcl]  Family History  Problem Relation Age of Onset   Peripheral vascular disease Father    Bone cancer Father    Stroke Sister        possible CVA   Diabetes Mother        type II, diet controlled   Cancer Maternal Aunt        non hodgkins lymphoma   Heart disease Maternal Grandmother    Breast cancer Maternal Aunt     Social History Social History   Tobacco Use   Smoking status: Former Smoker    Packs/day: 0.10    Years: 3.00    Pack years: 0.30     Types: Cigarettes    Quit date: 10/28/2012    Years since quitting: 5.8   Smokeless tobacco: Former Systems developer    Quit date: 11/10/2012  Substance Use Topics   Alcohol use: No    Alcohol/week: 0.0 standard drinks   Drug use: No    Review of Systems  Constitutional: No fever/chills Eyes: No visual changes. ENT: No sore throat. Cardiovascular: Denies chest pain. Respiratory: Denies shortness of breath. Gastrointestinal: No abdominal pain.  No nausea, no vomiting.  No diarrhea.  No constipation. Genitourinary: Negative for dysuria. Musculoskeletal:Positive for chronic back pain. Positive for left leg pain. Skin: Negative for rash. Neurological: Negative for headaches, focal weakness or numbness. ____________________________________________   PHYSICAL EXAM:  VITAL SIGNS: ED Triage Vitals  Enc Vitals Group     BP 08/28/18 1338 124/71     Pulse Rate 08/28/18 1338 69     Resp 08/28/18 1338 18     Temp 08/28/18 1338 98.7 F (37.1 C)     Temp Source 08/28/18 1338 Oral     SpO2 08/28/18 1338 98 %     Weight 08/28/18 1339 240 lb (108.9 kg)     Height 08/28/18 1339 5\' 3"  (1.6 m)     Head Circumference --      Peak Flow --      Pain Score 08/28/18 1350 0     Pain Loc --      Pain Edu? --      Excl. in [place]? --     Constitutional: Alert and oriented. Well appearing and in no acute distress. Eyes: Conjunctivae are normal. PERRL. EOMI. Head: Atraumatic. Nose: No congestion/rhinnorhea. Mouth/Throat: Mucous membranes are moist.  Oropharynx non-erythematous. Neck: No stridor.   Hematological/Lymphatic/Immunilogical: No cervical lymphadenopathy. Cardiovascular: Normal rate, regular rhythm. Grossly normal heart sounds.  Good peripheral circulation. Respiratory: Normal respiratory effort.  No retractions. Lungs CTAB. Gastrointestinal: Soft and nontender. No distention. No abdominal bruits. No CVA tenderness. Genitourinary:  Musculoskeletal: No lower extremity tenderness nor edema.  No  joint effusions. Neurologic:  Normal speech and language. No gross focal neurologic deficits are appreciated. No gait instability. Skin:  Skin is warm, dry and intact. No rash noted. Psychiatric: Mood and affect are normal. Speech and behavior are normal.  ____________________________________________   LABS (all labs ordered are listed, but only abnormal results are displayed)  Labs Reviewed - No data to display ____________________________________________  EKG  Not indicated. ____________________________________________  RADIOLOGY  ED MD interpretation:    No evidence of DVT in LLE.  Official radiology report(s): US Venous Img Lower Unilateral Left  Result Date: 08/28/2018 CLINICAL DATA:  Left lower extremity pain and edema. Former smoker. Evaluate for DVT. EXAM: LEFT LOWER EXTREMITY VENOUS DOPPLER ULTRASOUND TECHNIQUE: Gray-scale sonography with graded  compression, as well as color Doppler and duplex ultrasound were performed to evaluate the lower extremity deep venous systems from the level of the common femoral vein and including the common femoral, femoral, profunda femoral, popliteal and calf veins including the posterior tibial, peroneal and gastrocnemius veins when visible. The superficial great saphenous vein was also interrogated. Spectral Doppler was utilized to evaluate flow at rest and with distal augmentation maneuvers in the common femoral, femoral and popliteal veins. COMPARISON:  Left lower extremity venous Doppler ultrasound-09/04/2016 FINDINGS: Contralateral Common Femoral Vein: Respiratory phasicity is normal and symmetric with the symptomatic side. No evidence of thrombus. Normal compressibility. Common Femoral Vein: No evidence of thrombus. Normal compressibility, respiratory phasicity and response to augmentation. Saphenofemoral Junction: No evidence of thrombus. Normal compressibility and flow on color Doppler imaging. Profunda Femoral Vein: No evidence of thrombus.  Normal compressibility and flow on color Doppler imaging. Femoral Vein: No evidence of thrombus. Normal compressibility, respiratory phasicity and response to augmentation. Popliteal Vein: No evidence of thrombus. Normal compressibility, respiratory phasicity and response to augmentation. Calf Veins: No evidence of thrombus. Normal compressibility and flow on color Doppler imaging. Superficial Great Saphenous Vein: No evidence of thrombus. Normal compressibility. Venous Reflux:  None. Other Findings:  None. IMPRESSION: No evidence of DVT within the left lower extremity. Electronically Signed   By: Sandi Mariscal M.D.   On: 08/28/2018 15:05    ____________________________________________   PROCEDURES  Procedure(s) performed (including Critical Care):  Procedures  ____________________________________________   INITIAL IMPRESSION / ASSESSMENT AND PLAN     48 year old female presents with left lower extremity pain. Will [place] to rule out DVT. She denies chest pain or shortness of breath.  DIFFERENTIAL DIAGNOSIS  DVT, intermittent claudication, radiculopathy.  ED COURSE  DVT negative. She will be given a prescription for meloxicam and is to follow up with her PCP. She is to return to the ER for symptoms that change or worsen if unable to schedule an appointment. ____________________________________________   FINAL CLINICAL IMPRESSION(S) / ED DIAGNOSES  Final diagnoses:  Left leg swelling  Left leg pain     ED Discharge Orders         Ordered    meloxicam (MOBIC) 15 MG tablet  Daily     08/28/18 1523           Note:  This document was prepared using Dragon voice recognition software and may include unintentional dictation errors.   Victorino Dike, FNP 08/29/18 1456    Merlyn Lot, MD 08/29/18 1505

## 2018-09-02 DIAGNOSIS — F1190 Opioid use, unspecified, uncomplicated: Secondary | ICD-10-CM | POA: Insufficient documentation

## 2018-09-03 ENCOUNTER — Other Ambulatory Visit: Payer: Self-pay | Admitting: Neurology

## 2018-09-10 DIAGNOSIS — Z0289 Encounter for other administrative examinations: Secondary | ICD-10-CM | POA: Insufficient documentation

## 2018-09-13 ENCOUNTER — Emergency Department: Payer: BLUE CROSS/BLUE SHIELD

## 2018-09-13 ENCOUNTER — Encounter: Payer: Self-pay | Admitting: *Deleted

## 2018-09-13 ENCOUNTER — Other Ambulatory Visit: Payer: Self-pay

## 2018-09-13 ENCOUNTER — Emergency Department
Admission: EM | Admit: 2018-09-13 | Discharge: 2018-09-14 | Disposition: A | Discharge status: Home / Self Care | Payer: BLUE CROSS/BLUE SHIELD | Attending: Emergency Medicine | Admitting: Emergency Medicine

## 2018-09-13 DIAGNOSIS — Y929 Unspecified place or not applicable: Secondary | ICD-10-CM | POA: Diagnosis not present

## 2018-09-13 DIAGNOSIS — Y9389 Activity, other specified: Secondary | ICD-10-CM | POA: Diagnosis not present

## 2018-09-13 DIAGNOSIS — Z888 Allergy status to other drugs, medicaments and biological substances status: Secondary | ICD-10-CM | POA: Insufficient documentation

## 2018-09-13 DIAGNOSIS — Z87891 Personal history of nicotine dependence: Secondary | ICD-10-CM | POA: Insufficient documentation

## 2018-09-13 DIAGNOSIS — Y999 Unspecified external cause status: Secondary | ICD-10-CM | POA: Diagnosis not present

## 2018-09-13 DIAGNOSIS — Z79899 Other long term (current) drug therapy: Secondary | ICD-10-CM | POA: Diagnosis not present

## 2018-09-13 DIAGNOSIS — S91311A Laceration without foreign body, right foot, initial encounter: Secondary | ICD-10-CM | POA: Diagnosis not present

## 2018-09-13 DIAGNOSIS — W208XXA Other cause of strike by thrown, projected or falling object, initial encounter: Secondary | ICD-10-CM | POA: Diagnosis not present

## 2018-09-13 DIAGNOSIS — Z23 Encounter for immunization: Secondary | ICD-10-CM | POA: Insufficient documentation

## 2018-09-13 MED ORDER — OXYCODONE-ACETAMINOPHEN 5-325 MG PO TABS
1.0000 | ORAL_TABLET | Freq: Once | ORAL | Status: AC
  Administered 2018-09-13: 1 via ORAL
  Filled 2018-09-13: qty 1

## 2018-09-13 NOTE — ED Provider Notes (Addendum)
[facility] Emergency Department Provider Note  ____________________________________________  Time seen: Approximately 11:43 PM  I have reviewed the triage vital signs and the nursing notes.   HISTORY  Chief Complaint Laceration    HPI Bridget Beck is a 48 y.o. female who presents the emergency department complaining of injury to the right foot.  Patient and her significant other were moving a heavy desk when the item fell, striking her foot.  The corner of the desk hit her foot.  She sustained a laceration to the third digit, is complaining of pain to the metatarsal and phalangeal region.  No medications prior to arrival.         Past Medical History:  Diagnosis Date  . Depression    Managed  . GERD (gastroesophageal reflux disease)   . Hemorrhoids   . Migraines    with aura    Patient Active Problem List   Diagnosis Date Noted  . Colon cancer screening 05/08/2016  . Rectal bleeding 05/08/2016  . Achilles tendonitis 04/28/2016  . Bowel movement symptom 12/07/2014  . Intractable chronic migraine without aura and without status migrainosus 12/05/2014  . Weight gain 12/01/2013  . Left hip pain 12/01/2013  . Migraine without aura 04/20/2013  . Depression 04/26/2012  . Lumbar back pain with radiculopathy affecting left lower extremity 02/09/2012  . Memory changes 10/17/2011    Past Surgical History:  Procedure Laterality Date  . CERVICAL FUSION     x 2  . INCONTINENCE SURGERY  2004  . TUBAL LIGATION  2002    Prior to Admission medications   Medication Sig Start Date End Date Taking? Authorizing Provider  busPIRone (BUSPAR) 10 MG tablet TK 1 T PO BID. TAKE IN ADDITION TO CYMBALTA. 12/17/17   [provider]  cephALEXin (KEFLEX) 500 MG capsule Take 2 capsules (1,000 mg total) by mouth 2 (two) times daily. 09/14/18   Malacai Grantz, Charline Bills, PA-C  DULoxetine (CYMBALTA) 30 MG capsule Take by mouth. 01/08/18 01/08/19  [provider]  DULoxetine (CYMBALTA) 60 MG capsule  12/30/17   [provider]  Ketorolac Tromethamine (SPRIX) 15.75 MG/SPRAY SOLN Place into the nose.    [provider]  lidocaine (LIDODERM) 5 % Place 1 patch onto the skin daily. Remove & Discard patch within 12 hours or as directed by MD 10/04/16   [name], [name]  LYRICA 75 MG capsule Take 75 mg by mouth 2 (two) times daily. 08/15/17   [provider]  meloxicam (MOBIC) 15 MG tablet Take 1 tablet (15 mg total) by mouth daily. 08/28/18   Triplett, Johnette Abraham B, FNP  naproxen (NAPROSYN) 500 MG tablet Take 1 tablet with sumatriptan tablet 08/06/16   Jaffe, Adam R, DO  pantoprazole (PROTONIX) 40 MG tablet TK 1 T PO BID 30 MINUTES BEFORE MEALS 11/12/17   [provider]  promethazine (PHENERGAN) 25 MG tablet Take 1 tablet (25 mg total) by mouth every 6 (six) hours as needed for nausea. 02/05/16   Pieter Partridge, DO  SUMAtriptan Succinate 6 MG/0.5ML SOCT Inject 6 Mg into skin at earliest onset of migraine, may repeat in 1 hour if headache persists, or reoccurs 09/03/17   Tomi Likens, Adam R, DO  tiZANidine (ZANAFLEX) 2 MG tablet TK 1 T PO Q NIGHT 12/10/17   [provider]  topiramate (TOPAMAX) 100 MG tablet TAKE 1 TABLET BY MOUTH EVERY NIGHT AT BEDTIME 03/09/18   Jaffe, Adam R, DO  valACYclovir (VALTREX) 1000 MG tablet Take 2  tablets (2,000 mg total) by mouth 2 (two) times daily. For 1 day.  Repeat as needed with future episodes. 05/25/12   Tonia Ghent, MD    Allergies Pramipexole, Amitriptyline, Fluoxetine hcl, Lyrica [pregabalin], Methocarbamol, Mirtazapine, and Prozac [fluoxetine hcl]  Family History  Problem Relation Age of Onset  . Peripheral vascular disease Father   . Bone cancer Father   . Stroke Sister        possible CVA  . Diabetes Mother        type II, diet controlled  . Cancer Maternal Aunt        non hodgkins lymphoma  . Heart disease Maternal Grandmother   . Breast cancer Maternal Aunt      Social History Social History   Tobacco Use  . Smoking status: Former Smoker    Packs/day: 0.10    Years: 3.00    Pack years: 0.30    Types: Cigarettes    Quit date: 10/28/2012    Years since quitting: 5.8  . Smokeless tobacco: Former Systems developer    Quit date: 11/10/2012  Substance Use Topics  . Alcohol use: No    Alcohol/week: 0.0 standard drinks  . Drug use: No     Review of Systems  Constitutional: No fever/chills Eyes: No visual changes. No discharge ENT: No upper respiratory complaints. Cardiovascular: no chest pain. Respiratory: no cough. No SOB. Gastrointestinal: No abdominal pain.  No nausea, no vomiting. Musculoskeletal: Negative for musculoskeletal pain. Skin: Negative for rash, abrasions, lacerations, ecchymosis. Neurological: Negative for headaches, focal weakness or numbness. 10-point ROS otherwise negative.  ____________________________________________   PHYSICAL EXAM:  VITAL SIGNS: ED Triage Vitals  Enc Vitals Group     BP 09/13/18 2036 128/68     Pulse Rate 09/13/18 2036 65     Resp 09/13/18 2036 18     Temp 09/13/18 2036 98.2 F (36.8 C)     Temp Source 09/13/18 2036 Oral     SpO2 09/13/18 2036 100 %     Weight --      Height --      Head Circumference --      Peak Flow --      Pain Score 09/13/18 2039 10     Pain Loc --      Pain Edu? --      Excl. in [place]? --      Constitutional: Alert and oriented. Well appearing and in no acute distress. Eyes: Conjunctivae are normal. PERRL. EOMI. Head: Atraumatic. ENT:      Ears:       Nose: No congestion/rhinnorhea.      Mouth/Throat: Mucous membranes are moist.  Neck: No stridor.    Cardiovascular: Normal rate, regular rhythm. Normal S1 and S2.  Good peripheral circulation. Respiratory: Normal respiratory effort without tachypnea or retractions. Lungs CTAB. Good air entry to the bases with no decreased or absent breath sounds. Musculoskeletal: Full range of motion to all extremities. No gross  deformities appreciated.  Visualization of the right foot reveals superficial linear laceration over the proximal phalanx of the third digit.  Bleeding is controlled at this time.  Patient is able to extend and flex the digits.  Patient is very tender to palpation along the distal tarsals over the second through fourth region as well as the proximal phalanxes of corresponding digits.  No gross deformity noted.  Good range of motion to the ankle.  Dorsalis pedis pulse intact.  Sensation capillary refill intact all digits. Neurologic:  Normal speech  and language. No gross focal neurologic deficits are appreciated.  Skin:  Skin is warm, dry and intact. No rash noted. Psychiatric: Mood and affect are normal. Speech and behavior are normal. Patient exhibits appropriate insight and judgement.   ____________________________________________   LABS (all labs ordered are listed, but only abnormal results are displayed)  Labs Reviewed - No data to display ____________________________________________  EKG   ____________________________________________  RADIOLOGY I personally viewed and evaluated these images as part of my medical decision making, as well as reviewing the written report by the radiologist.  Dg Foot Complete Right  Result Date: 09/13/2018 CLINICAL DATA:  Initial evaluation for acute crush injury. EXAM: RIGHT FOOT COMPLETE - 3+ VIEW COMPARISON:  None. FINDINGS: There is no evidence of fracture or dislocation. There is no evidence of arthropathy or other focal bone abnormality. Soft tissues are unremarkable. No radiopaque foreign body. IMPRESSION: No acute osseous abnormality about the right foot. Electronically Signed   By: Jeannine Boga M.D.   On: 09/13/2018 22:22    ____________________________________________    PROCEDURES  Procedure(s) performed:    Marland KitchenMarland KitchenLaceration Repair  Date/Time: 09/24/2018 11:45 PM Performed by: Darletta Moll, PA-C Authorized by:  Darletta Moll, PA-C   Consent:    Consent obtained:  Verbal   Consent given by:  Patient   Risks discussed:  Pain Anesthesia (see MAR for exact dosages):    Anesthesia method:  None Laceration details:    Location:  Toe   Toe location:  R third toe   Length (cm):  1.5 Repair type:    Repair type:  Simple Exploration:    Hemostasis achieved with:  Direct pressure   Wound exploration: wound explored through full range of motion and entire depth of wound probed and visualized     Wound extent: no foreign bodies/material noted, no muscle damage noted, no nerve damage noted, no tendon damage noted, no underlying fracture noted and no vascular damage noted   Treatment:    Amount of cleaning:  Standard   Irrigation solution:  Sterile saline   Irrigation method:  Syringe Skin repair:    Repair method:  Tissue adhesive Approximation:    Approximation:  Close Post-procedure details:    Dressing:  Open (no dressing)   Patient tolerance of procedure:  Tolerated well, no immediate complications      Medications  oxyCODONE-acetaminophen (PERCOCET/ROXICET) 5-325 MG per tablet 1 tablet (1 tablet Oral Given 09/13/18 2327)  Tdap (BOOSTRIX) injection 0.5 mL (0.5 mLs Intramuscular Given 09/14/18 0019)     ____________________________________________   INITIAL IMPRESSION / ASSESSMENT AND PLAN / ED COURSE  Pertinent labs & imaging results that were available during my care of the patient were reviewed by me and considered in my medical decision making (see chart for details).  Review of the [place] CSRS was performed in accordance of the [place] prior to dispensing any controlled drugs.           Patient's diagnosis is consistent with foot injury, toe laceration.  Patient presented to the emergency department after dropping a desk on her foot.  Patient sustained a superficial laceration to the third digit and was complaining of pain to the foot.  Laceration is closed using Dermabond.   Patient tolerated well.  X-ray reveals no acute osseous abnormality about the right foot.  Patient is given instructions on wound care.  Patient is to limit weightbearing until symptoms improve.  Follow-up with primary care as needed..  Patient is given ED precautions to  return to the ED for any worsening or new symptoms.   Addendum: Addendum was added to add procedure note  ____________________________________________  FINAL CLINICAL IMPRESSION(S) / ED DIAGNOSES  Final diagnoses:  Laceration of right foot, initial encounter      NEW MEDICATIONS STARTED DURING THIS VISIT:  ED Discharge Orders         Ordered    cephALEXin (KEFLEX) 500 MG capsule  2 times daily     09/14/18 0020              This chart was dictated using voice recognition software/Dragon. Despite best efforts to proofread, errors can occur which can change the meaning. Any change was purely unintentional.    Brynda Peon 09/14/18 Primus Bravo, MD 09/16/18 0721    Darletta Moll, PA-C 09/24/18 2346    Earleen Newport, MD 09/25/18 [phone]

## 2018-09-13 NOTE — ED Triage Notes (Signed)
Per patient's report, family was putting together a computer table and the top fell off onto the right toes. Laceration noted across 3rd toe. Bleeding is controlled.

## 2018-09-14 MED ORDER — TETANUS-DIPHTH-ACELL PERTUSSIS 5-2.5-18.5 LF-MCG/0.5 IM SUSP
0.5000 mL | Freq: Once | INTRAMUSCULAR | Status: AC
  Administered 2018-09-14: 0.5 mL via INTRAMUSCULAR
  Filled 2018-09-14: qty 0.5

## 2018-09-14 MED ORDER — CEPHALEXIN 500 MG PO CAPS: 1000.0000 mg | ORAL_CAPSULE | Freq: Two times a day (BID) | ORAL | 0 refills | Status: DC

## 2019-01-18 ENCOUNTER — Other Ambulatory Visit: Payer: Self-pay

## 2019-01-18 ENCOUNTER — Ambulatory Visit (HOSPITAL_COMMUNITY)
Admission: RE | Admit: 2019-01-18 | Discharge: 2019-01-18 | Disposition: A | Discharge status: Home / Self Care | Payer: BLUE CROSS/BLUE SHIELD | Source: Ambulatory Visit | Attending: Family Medicine | Admitting: Family Medicine

## 2019-01-18 ENCOUNTER — Other Ambulatory Visit (HOSPITAL_COMMUNITY): Payer: Self-pay | Admitting: Family Medicine

## 2019-01-18 DIAGNOSIS — Z1231 Encounter for screening mammogram for malignant neoplasm of breast: Secondary | ICD-10-CM | POA: Diagnosis present

## 2019-02-01 ENCOUNTER — Telehealth: Payer: Self-pay | Admitting: Obstetrics and Gynecology

## 2019-02-01 ENCOUNTER — Encounter: Payer: Self-pay | Admitting: Obstetrics and Gynecology

## 2019-02-01 ENCOUNTER — Ambulatory Visit (INDEPENDENT_AMBULATORY_CARE_PROVIDER_SITE_OTHER): Payer: BLUE CROSS/BLUE SHIELD | Admitting: Obstetrics and Gynecology

## 2019-02-01 ENCOUNTER — Other Ambulatory Visit: Payer: Self-pay

## 2019-02-01 VITALS — BP 112/70 | Ht 63.0 in | Wt 252.0 lb

## 2019-02-01 DIAGNOSIS — N8110 Cystocele, unspecified: Secondary | ICD-10-CM | POA: Diagnosis not present

## 2019-02-01 DIAGNOSIS — N951 Menopausal and female climacteric states: Secondary | ICD-10-CM

## 2019-02-01 DIAGNOSIS — N812 Incomplete uterovaginal prolapse: Secondary | ICD-10-CM

## 2019-02-01 NOTE — Progress Notes (Signed)
Patient ID: Bridget Beck, female   DOB: 14-Aug-1970, 49 y.o.   MRN: [id number]  Reason for Consult: Follow-up (IUD check/poss prolapse and hot flashes, pain in uterus and having trouble urinating )   Referred by Nathanial Millman*  Subjective:     HPI:  Bridget Beck is a 49 y.o. female. She reports that for the last months she has had hot flashes. Usually 3-4 a day occurring throughout the day and at night. She is having some sleep disturbances related especially to the hot flashes at night.  She reports that she has not had a period in "close to one year, at least more than 6 months"   She feels a vaginal bulge which has caused some discomfort. This is especially with longer periods of standing.   She reports urinary urgency incontinence as well as leakage with cough laugh sneeze. When she urinates she feels like she can not fully empty her bladder.  She has been restricting her fluid intake at night which helps her not wake frequently to empty her bladder.   She has a history of 3 prior vaginal birth with two babies weighing more than 9 lbs and 1 baby weighing more than 10 lbs.   She reports that in 2004 she has a 6 hour prolapse repair surgery. She was 2 years postpartum at that point any was symptomatic of prolapse.    She is sexually active.  She is having lower back surgery soon.   Past Medical History:  Diagnosis Date  . Depression    Managed  . GERD (gastroesophageal reflux disease)   . Hemorrhoids   . Migraines    with aura   Family History  Problem Relation Age of Onset  . Peripheral vascular disease Father   . Bone cancer Father   . Stroke Sister        possible CVA  . Diabetes Mother        type II, diet controlled  . Cancer Maternal Aunt        non hodgkins lymphoma  . Heart disease Maternal Grandmother   . Breast cancer Maternal Aunt    Past Surgical History:  Procedure Laterality Date  . CERVICAL FUSION     x 2  . INCONTINENCE SURGERY   2004  . TUBAL LIGATION  2002    Short Social History:  Social History   Tobacco Use  . Smoking status: Former Smoker    Packs/day: 0.10    Years: 3.00    Pack years: 0.30    Types: Cigarettes    Quit date: 10/28/2012    Years since quitting: 6.2  . Smokeless tobacco: Former Systems developer    Quit date: 11/10/2012  Substance Use Topics  . Alcohol use: No    Alcohol/week: 0.0 standard drinks    Allergies  Allergen Reactions  . Pramipexole Other (See Comments)  . Amitriptyline Other (See Comments)    Had severe migraine and pt could not see for 2 hours.  . Fluoxetine Hcl Other (See Comments)    Fatigue  . Lyrica [Pregabalin]     Migraine  . Methocarbamol Other (See Comments)    Stomach upset Stomach upset  . Mirtazapine Other (See Comments)    "felt like a zombie" "felt like a zombie"  . Prozac [Fluoxetine Hcl]     Fatigue     Current Outpatient Medications  Medication Sig Dispense Refill  . busPIRone (BUSPAR) 30 MG tablet Take 30 mg by mouth 2 (  two) times daily.    . DULoxetine (CYMBALTA) 60 MG capsule   10  . Fremanezumab-vfrm (AJOVY) 225 MG/1.5ML SOAJ INJECT 225 MG UNDER THE SKIN EVERY 28 DAYS    . furosemide (LASIX) 20 MG tablet TAKE 1 TABLET BY MOUTH FOR UP TO 3 DAYS AS NEEDED FOR LEG SWELLING    . Ketorolac Tromethamine (SPRIX) 15.75 MG/SPRAY SOLN Place into the nose.    . lidocaine (LIDODERM) 5 % Place 1 patch onto the skin daily. Remove & Discard patch within 12 hours or as directed by MD 30 patch 0  . naproxen (NAPROSYN) 500 MG tablet Take 1 tablet with sumatriptan tablet 20 tablet 2  . pantoprazole (PROTONIX) 40 MG tablet TK 1 T PO BID 30 MINUTES BEFORE MEALS  0  . pregabalin (LYRICA) 150 MG capsule Take by mouth.    . promethazine (PHENERGAN) 25 MG tablet Take 1 tablet (25 mg total) by mouth every 6 (six) hours as needed for nausea. 40 tablet 3  . Riboflavin 400 MG TABS Take by mouth.    . SUMAtriptan (IMITREX) 100 MG tablet     . SUMAtriptan Succinate 6 MG/0.5ML  SOCT Inject 6 Mg into skin at earliest onset of migraine, may repeat in 1 hour if headache persists, or reoccurs 6 mL 5  . topiramate (TOPAMAX) 50 MG tablet Take 50 mg by mouth daily.    . traMADol (ULTRAM) 50 MG tablet Take by mouth.    . valACYclovir (VALTREX) 1000 MG tablet Take 2 tablets (2,000 mg total) by mouth 2 (two) times daily. For 1 day.  Repeat as needed with future episodes. 8 tablet 1  . DULoxetine (CYMBALTA) 30 MG capsule Take by mouth.     Current Facility-Administered Medications  Medication Dose Route Frequency Provider Last Rate Last Admin  . 0.9 %  sodium chloride infusion  500 mL Intravenous Continuous [name], [name], MD      . levonorgestrel (MIRENA) 20 MCG/24HR IUD   Intrauterine Once Johsua Shevlin R, MD      . triamcinolone acetonide (KENALOG-40) injection 20 mg  20 mg Other Once [name], [name]        Review of Systems  Constitutional: Negative for chills, fatigue, fever and unexpected weight change.  HENT: Negative for trouble swallowing.  Eyes: Negative for loss of vision.  Respiratory: Negative for cough, shortness of breath and wheezing.  Cardiovascular: Negative for chest pain, leg swelling, palpitations and syncope.  GI: Negative for abdominal pain, blood in stool, diarrhea, nausea and vomiting.  GU: Positive for difficulty urinating and frequency. Negative for dysuria and hematuria.  Musculoskeletal: Positive for back pain. Negative for leg pain and joint pain.  Skin: Negative for rash.  Neurological: Negative for dizziness, headaches, light-headedness, numbness and seizures.  Psychiatric: Negative for behavioral problem, confusion, depressed mood and sleep disturbance.        Objective:  Objective   Vitals:   02/01/19 1404  BP: 112/70  Weight: 252 lb (114.3 kg)  Height: 5\' 3"  (1.6 m)   Body mass index is 44.64 kg/m.  Physical Exam Vitals and nursing note reviewed.  Constitutional:      Appearance: She is well-developed.  HENT:      Head: Normocephalic and atraumatic.  Cardiovascular:     Rate and Rhythm: Normal rate and regular rhythm.  Pulmonary:     Effort: Pulmonary effort is normal.     Breath sounds: Normal breath sounds.  Abdominal:     General: Bowel sounds are  normal.     Palpations: Abdomen is soft.  Genitourinary:    Comments: External: Normal appearing vulva. No lesions noted.  Speculum examination: Normal appearing cervix. No blood in the vaginal vault. no discharge.    IUD strings seen Bimanual examination: Uterus midline, non-tender, normal in size, shape and contour.  No CMT. No adnexal masses. No adnexal tenderness. Pelvis not fixed. Musculoskeletal:        General: Normal range of motion.  Skin:    General: Skin is warm and dry.  Neurological:     Mental Status: She is alert and oriented to person, place, and time.  Psychiatric:        Behavior: Behavior normal.        Thought Content: Thought content normal.        Judgment: Judgment normal.   POP-Q exam:      Aa = -1 Ba = -1 C = 3  gH = 5 pb = 3 TVL = 7  Ap = -3 BP = -3 D = 5   Summary statement of POP-Q exam:    the anterior vaginal wall is 1 cm behind the level of the hymen the cuff / cervix is 3 cm behind the level of the hymen,  and the posterior vaginal wall is 3 cm behind the level of the hymen  Anterior vaginal wall irregular in appearance secondary to piror prolapse repair.        Assessment/Plan:     49 yo with stage II prolapse and mixed urianary incontinence  1. Stage II prolapse- history of prolapse repair surgery. Will refer to urogynecology for further evaluation and care. Discussed options for pessary, physical therapy and surgery 2. Mixed urinary incontinence. - Reviewed options for treatment both medical, therapeutic, and surgical. Offered to initiate medication for urge incontinence. She declines at this time. "I don't want to be on more medication."  She will consider her options further.  3. Vasomotor symptoms-  no period in close to one year. Could be perimenopausal or from the IUD. Will obtain [facility] and estradiol labs. Reviewed options for treatment.    More than 25 minutes were spent face to face with the patient in the room with more than 50% of the time spent providing counseling and discussing the plan of management.    Adrian Prows MD [facility] 02/01/2019 8:01 PM

## 2019-02-01 NOTE — Telephone Encounter (Signed)
Pt was scheduled and unaware about her ins. Advised pt that we are out of network so higher cost's. Pt was a little frustrated but said she still wants to be seen.

## 2019-02-02 LAB — ESTRADIOL: Estradiol: 110 pg/mL

## 2019-02-02 LAB — FOLLICLE STIMULATING HORMONE: FSH: 11 m[IU]/mL

## 2019-02-13 ENCOUNTER — Other Ambulatory Visit: Payer: Self-pay

## 2019-02-13 ENCOUNTER — Ambulatory Visit
Admission: EM | Admit: 2019-02-13 | Discharge: 2019-02-13 | Disposition: A | Discharge status: Home / Self Care | Payer: BLUE CROSS/BLUE SHIELD | Attending: Emergency Medicine | Admitting: Emergency Medicine

## 2019-02-13 DIAGNOSIS — L02412 Cutaneous abscess of left axilla: Secondary | ICD-10-CM

## 2019-02-13 MED ORDER — CEPHALEXIN 250 MG PO CAPS: 250.0000 mg | ORAL_CAPSULE | Freq: Four times a day (QID) | ORAL | 0 refills | Status: DC

## 2019-02-13 MED ORDER — IBUPROFEN 800 MG PO TABS: 800.0000 mg | ORAL_TABLET | Freq: Three times a day (TID) | ORAL | 0 refills | Status: DC

## 2019-02-13 NOTE — ED Triage Notes (Signed)
Pt presents with abscess under left axilla, started as hard ref bump, area is red and hot

## 2019-02-13 NOTE — ED Provider Notes (Signed)
[facility]    CSN: [id number] Arrival date & time: 02/13/19  1052      History   Chief Complaint Chief Complaint  Patient presents with  . Abscess    HPI Bridget Beck is a 49 y.o. female.   Bridget Beck 49 years old female presented to the urgent care with a complaint of abscess under her left axilla for the past 4 days.  Reported no drainage and redness.  Reported small scab that is painless a few days ago under her left axilla.  He has not used any medication.  Report pain at 5 on a scale 1-10.  Nothing make his symptoms worse.  Denies chills, fever, nausea, vomiting, diarrhea, chest pain, chest tightness, confusion.       Past Medical History:  Diagnosis Date  . Depression    Managed  . GERD (gastroesophageal reflux disease)   . Hemorrhoids   . Migraines    with aura    Patient Active Problem List   Diagnosis Date Noted  . Colon cancer screening 05/08/2016  . Rectal bleeding 05/08/2016  . Achilles tendonitis 04/28/2016  . Bowel movement symptom 12/07/2014  . Intractable chronic migraine without aura and without status migrainosus 12/05/2014  . Weight gain 12/01/2013  . Left hip pain 12/01/2013  . Migraine without aura 04/20/2013  . Depression 04/26/2012  . Lumbar back pain with radiculopathy affecting left lower extremity 02/09/2012  . Memory changes 10/17/2011    Past Surgical History:  Procedure Laterality Date  . CERVICAL FUSION     x 2  . INCONTINENCE SURGERY  2004  . TUBAL LIGATION  2002    OB History    Gravida  7   Para  3   Term  3   Preterm      AB  4   Living  3     SAB  3   TAB  1   Ectopic      Multiple      Live Births  3            Home Medications    Prior to Admission medications   Medication Sig Start Date End Date Taking? Authorizing Provider  busPIRone (BUSPAR) 30 MG tablet Take 30 mg by mouth 2 (two) times daily. 01/05/19   [provider]  cephALEXin (KEFLEX) 250 MG capsule  Take 1 capsule (250 mg total) by mouth 4 (four) times daily. 02/13/19   Mikko Lewellen, Darrelyn Hillock, FNP  DULoxetine (CYMBALTA) 30 MG capsule Take by mouth. 01/08/18 01/08/19  [provider]  DULoxetine (CYMBALTA) 60 MG capsule  12/30/17   [provider]  Fremanezumab-vfrm (AJOVY) 225 MG/1.5ML SOAJ INJECT 225 MG UNDER THE SKIN EVERY 28 DAYS 08/25/18   [provider]  furosemide (LASIX) 20 MG tablet TAKE 1 TABLET BY MOUTH FOR UP TO 3 DAYS AS NEEDED FOR LEG SWELLING 01/25/19   [provider]  ibuprofen (ADVIL) 800 MG tablet Take 1 tablet (800 mg total) by mouth 3 (three) times daily. with food 02/13/19   Dede Dobesh, Darrelyn Hillock, FNP  Ketorolac Tromethamine (SPRIX) 15.75 MG/SPRAY SOLN Place into the nose.    [provider]  lidocaine (LIDODERM) 5 % Place 1 patch onto the skin daily. Remove & Discard patch within 12 hours or as directed by MD 10/04/16   [name], [name]  naproxen (NAPROSYN) 500 MG tablet Take 1 tablet with sumatriptan tablet 08/06/16   Tomi Likens, Adam R, DO  pantoprazole (PROTONIX) 40  MG tablet TK 1 T PO BID 30 MINUTES BEFORE MEALS 11/12/17   [provider]  pregabalin (LYRICA) 150 MG capsule Take by mouth. 06/08/18   [provider]  promethazine (PHENERGAN) 25 MG tablet Take 1 tablet (25 mg total) by mouth every 6 (six) hours as needed for nausea. 02/05/16   Pieter Partridge, DO  Riboflavin 400 MG TABS Take by mouth. 06/01/18   [provider]  SUMAtriptan (IMITREX) 100 MG tablet  04/09/18   [provider]  SUMAtriptan Succinate 6 MG/0.5ML SOCT Inject 6 Mg into skin at earliest onset of migraine, may repeat in 1 hour if headache persists, or reoccurs 09/03/17   Tomi Likens, Adam R, DO  topiramate (TOPAMAX) 50 MG tablet Take 50 mg by mouth daily. 01/04/19   [provider]  traMADol Veatrice Bourbon) 50 MG tablet Take by mouth. 01/12/19   [provider]  valACYclovir (VALTREX) 1000 MG tablet Take 2 tablets (2,000 mg total) by  mouth 2 (two) times daily. For 1 day.  Repeat as needed with future episodes. 05/25/12   Tonia Ghent, MD    Family History Family History  Problem Relation Age of Onset  . Peripheral vascular disease Father   . Bone cancer Father   . Stroke Sister        possible CVA  . Diabetes Mother        type II, diet controlled  . Cancer Maternal Aunt        non hodgkins lymphoma  . Heart disease Maternal Grandmother   . Breast cancer Maternal Aunt     Social History Social History   Tobacco Use  . Smoking status: Former Smoker    Packs/day: 0.10    Years: 3.00    Pack years: 0.30    Types: Cigarettes    Quit date: 10/28/2012    Years since quitting: 6.2  . Smokeless tobacco: Former Systems developer    Quit date: 11/10/2012  Substance Use Topics  . Alcohol use: No    Alcohol/week: 0.0 standard drinks  . Drug use: No     Allergies   Pramipexole, Amitriptyline, Fluoxetine hcl, Methocarbamol, Mirtazapine, and Prozac [fluoxetine hcl]   Review of Systems Review of Systems  Constitutional: Negative.   Respiratory: Negative.   Cardiovascular: Negative.   Skin: Positive for color change.  All other systems reviewed and are negative.    Physical Exam Triage Vital Signs ED Triage Vitals  Enc Vitals Group     BP 02/13/19 1122 107/70     Pulse Rate 02/13/19 1122 90     Resp 02/13/19 1122 18     Temp 02/13/19 1122 98.8 F (37.1 C)     Temp src --      SpO2 02/13/19 1122 95 %     Weight --      Height --      Head Circumference --      Peak Flow --      Pain Score 02/13/19 1120 10     Pain Loc --      Pain Edu? --      Excl. in [place]? --    No data found.  Updated Vital Signs BP 107/70   Pulse 90   Temp 98.8 F (37.1 C)   Resp 18   SpO2 95%   Visual Acuity Right Eye Distance:   Left Eye Distance:   Bilateral Distance:    Right Eye Near:   Left Eye Near:  Bilateral Near:     Physical Exam Vitals and nursing note reviewed.  Constitutional:      General: She is  not in acute distress.    Appearance: Normal appearance. She is obese. She is not ill-appearing or toxic-appearing.  Cardiovascular:     Rate and Rhythm: Normal rate.     Pulses: Normal pulses.     Heart sounds: Normal heart sounds.  Pulmonary:     Effort: Pulmonary effort is normal. No respiratory distress.     Breath sounds: Normal breath sounds. No wheezing.  Chest:     Chest wall: No tenderness.  Skin:    General: Skin is warm.     Findings: Abscess and erythema present.  Neurological:     Mental Status: She is alert.      UC Treatments / Results  Labs (all labs ordered are listed, but only abnormal results are displayed) Labs Reviewed - No data to display  EKG   Radiology No results found.  Procedures Procedures (including critical care time)  Medications Ordered in UC Medications - No data to display  Initial Impression / Assessment and Plan / UC Course  I have reviewed the triage vital signs and the nursing notes.  Pertinent labs & imaging results that were available during my care of the patient were reviewed by me and considered in my medical decision making (see chart for details).   Patient stable for discharge I&D was not completed Keflex will be prescribed for possible infection  Tylenol be prescribed for pain If symptoms do not resolve patient will return for I&D Patient verbalized understanding the plan of care  Final Clinical Impressions(s) / UC Diagnoses   Final diagnoses:  Abscess of axilla, left     Discharge Instructions     I&D was not completed Wash site daily with warm water and mild soap Take ibuprofen for pain Take antibiotic as prescribed and to completion Follow up here or with PCP if symptoms persists To return if symptoms are worse for possible I&D Return or go to the ED if you have any new or worsening symptoms     ED Prescriptions    Medication Sig Dispense Auth. Provider   cephALEXin (KEFLEX) 250 MG capsule Take 1  capsule (250 mg total) by mouth 4 (four) times daily. 28 capsule Ellouise Mcwhirter S, FNP   ibuprofen (ADVIL) 800 MG tablet Take 1 tablet (800 mg total) by mouth 3 (three) times daily. with food 42 tablet Rolando Whitby, Darrelyn Hillock, FNP     PDMP not reviewed this encounter.   Emerson Monte, FNP 02/13/19 1216

## 2019-02-13 NOTE — Discharge Instructions (Addendum)
I&D was not completed Wash site daily with warm water and mild soap Take ibuprofen for pain Take antibiotic as prescribed and to completion Follow up here or with PCP if symptoms persists To return if symptoms are worse for possible I&D Return or go to the ED if you have any new or worsening symptoms

## 2019-02-15 ENCOUNTER — Ambulatory Visit
Admission: EM | Admit: 2019-02-15 | Discharge: 2019-02-15 | Disposition: A | Discharge status: Home / Self Care | Payer: BLUE CROSS/BLUE SHIELD

## 2019-02-15 ENCOUNTER — Other Ambulatory Visit: Payer: Self-pay

## 2019-02-15 DIAGNOSIS — Z5189 Encounter for other specified aftercare: Secondary | ICD-10-CM

## 2019-02-15 NOTE — ED Triage Notes (Addendum)
Pt presents to UC w/ c/o abscess on left axilla. Pt was seen 2 days ago for abscess and was prescribed antibiotics which she has been taking. Pt's wound is open and draining and would like to know if it needs to be stitched. Pt states it has decreased some in size.

## 2019-02-15 NOTE — Discharge Instructions (Addendum)
Apply warm compresses 3-4x daily for 10-15 minutes Wash site daily with warm water and mild soap Keep covered to avoid friction Continue with antibiotic as prescribed and to completion Follow up here or with PCP if symptoms persists Return or go to the ED if you have any new or worsening symptoms increased redness, swelling, pain, nausea, vomiting, fever, chills, etc..Marland Kitchen

## 2019-02-15 NOTE — ED Provider Notes (Signed)
[facility]   [id number] 02/15/19 Arrival Time: [id number]   CC: Wound check  SUBJECTIVE:  Bridget Beck is a 49 y.o. female present for wound check.  Had incision and drainage 2 days ago to LT axilla.  Here today for wound check.  Reports improvement in redness and swelling.  Denies fever, chills, nausea, vomiting.    ROS: As per HPI.  All other pertinent ROS negative.     Past Medical History:  Diagnosis Date  . Depression    Managed  . GERD (gastroesophageal reflux disease)   . Hemorrhoids   . Migraines    with aura   Past Surgical History:  Procedure Laterality Date  . CERVICAL FUSION     x 2  . INCONTINENCE SURGERY  2004  . TUBAL LIGATION  2002   Allergies  Allergen Reactions  . Pramipexole Other (See Comments)  . Amitriptyline Other (See Comments)    Had severe migraine and pt could not see for 2 hours.  . Fluoxetine Hcl Other (See Comments)    Fatigue  . Methocarbamol Other (See Comments)    Stomach upset Stomach upset  . Mirtazapine Other (See Comments)    "felt like a zombie" "felt like a zombie"  . Prozac [Fluoxetine Hcl]     Fatigue    Current Facility-Administered Medications on File Prior to Encounter  Medication Dose Route Frequency Provider Last Rate Last Admin  . 0.9 %  sodium chloride infusion  500 mL Intravenous Continuous [name], [name], MD      . levonorgestrel (MIRENA) 20 MCG/24HR IUD   Intrauterine Once Schuman, Christanna R, MD      . triamcinolone acetonide (KENALOG-40) injection 20 mg  20 mg Other Once [name], [name]       Current Outpatient Medications on File Prior to Encounter  Medication Sig Dispense Refill  . busPIRone (BUSPAR) 30 MG tablet Take 30 mg by mouth 2 (two) times daily.    . cephALEXin (KEFLEX) 250 MG capsule Take 1 capsule (250 mg total) by mouth 4 (four) times daily. 28 capsule 0  . DULoxetine (CYMBALTA) 30 MG capsule Take by mouth.    . DULoxetine (CYMBALTA) 60 MG capsule   10  . Fremanezumab-vfrm  (AJOVY) 225 MG/1.5ML SOAJ INJECT 225 MG UNDER THE SKIN EVERY 28 DAYS    . furosemide (LASIX) 20 MG tablet TAKE 1 TABLET BY MOUTH FOR UP TO 3 DAYS AS NEEDED FOR LEG SWELLING    . ibuprofen (ADVIL) 800 MG tablet Take 1 tablet (800 mg total) by mouth 3 (three) times daily. with food 42 tablet 0  . Ketorolac Tromethamine (SPRIX) 15.75 MG/SPRAY SOLN Place into the nose.    . lidocaine (LIDODERM) 5 % Place 1 patch onto the skin daily. Remove & Discard patch within 12 hours or as directed by MD 30 patch 0  . naproxen (NAPROSYN) 500 MG tablet Take 1 tablet with sumatriptan tablet 20 tablet 2  . pantoprazole (PROTONIX) 40 MG tablet TK 1 T PO BID 30 MINUTES BEFORE MEALS  0  . pregabalin (LYRICA) 150 MG capsule Take by mouth.    . promethazine (PHENERGAN) 25 MG tablet Take 1 tablet (25 mg total) by mouth every 6 (six) hours as needed for nausea. 40 tablet 3  . Riboflavin 400 MG TABS Take by mouth.    . SUMAtriptan (IMITREX) 100 MG tablet     . SUMAtriptan Succinate 6 MG/0.5ML SOCT Inject 6 Mg into skin at earliest onset of migraine,  may repeat in 1 hour if headache persists, or reoccurs 6 mL 5  . topiramate (TOPAMAX) 50 MG tablet Take 50 mg by mouth daily.    . traMADol (ULTRAM) 50 MG tablet Take by mouth.    . valACYclovir (VALTREX) 1000 MG tablet Take 2 tablets (2,000 mg total) by mouth 2 (two) times daily. For 1 day.  Repeat as needed with future episodes. 8 tablet 1   Social History   Socioeconomic History  . Marital status: Married    Spouse name: Not on file  . Number of children: 3  . Years of education: Not on file  . Highest education level: Not on file  Occupational History  . Occupation: CMA    Employer:  [employer]  Tobacco Use  . Smoking status: Former Smoker    Packs/day: 0.10    Years: 3.00    Pack years: 0.30    Types: Cigarettes    Quit date: 10/28/2012    Years since quitting: 6.3  . Smokeless tobacco: Former Systems developer    Quit date: 11/10/2012  Substance and Sexual  Activity  . Alcohol use: No    Alcohol/week: 0.0 standard drinks  . Drug use: No  . Sexual activity: Yes    Partners: Male    Birth control/protection: Surgical    Comment: Tubal  Other Topics Concern  . Not on file  Social History Narrative   2 years of college.   Former CMA at [employer]   1st husband died from [employer] in May 22, 2010   Remarried    3 kids   Social Determinants of Health   Financial Resource Strain:   . Difficulty of Paying Living Expenses: Not on file  Food Insecurity:   . Worried About Charity fundraiser in the Last Year: Not on file  . Ran Out of Food in the Last Year: Not on file  Transportation Needs:   . Lack of Transportation (Medical): Not on file  . Lack of Transportation (Non-Medical): Not on file  Physical Activity:   . Days of Exercise per Week: Not on file  . Minutes of Exercise per Session: Not on file  Stress:   . Feeling of Stress : Not on file  Social Connections:   . Frequency of Communication with Friends and Family: Not on file  . Frequency of Social Gatherings with Friends and Family: Not on file  . Attends Religious Services: Not on file  . Active Member of Clubs or Organizations: Not on file  . Attends Archivist Meetings: Not on file  . Marital Status: Not on file  Intimate Partner Violence:   . Fear of Current or Ex-Partner: Not on file  . Emotionally Abused: Not on file  . Physically Abused: Not on file  . Sexually Abused: Not on file   Family History  Problem Relation Age of Onset  . Peripheral vascular disease Father   . Bone cancer Father   . Stroke Sister        possible CVA  . Diabetes Mother        type II, diet controlled  . Cancer Maternal Aunt        non hodgkins lymphoma  . Heart disease Maternal Grandmother   . Breast cancer Maternal Aunt     OBJECTIVE:  Vitals:   02/15/19 1440  BP: 114/76  Pulse: 67  Resp: 16  Temp: 99.1 F (37.3 C)  TempSrc: Oral  SpO2: 96%     General  appearance:  alert; no distress Skin: 0.5 - 1 cm laceration to LT axilla healing with mild surrounding erythema, mild drainage present, TTP Psychological: alert and cooperative; normal mood and affect  ASSESSMENT & PLAN:  1. Wound check, abscess    Apply warm compresses 3-4x daily for 10-15 minutes Wash site daily with warm water and mild soap Keep covered to avoid friction Continue with antibiotic as prescribed and to completion Follow up here or with PCP if symptoms persists Return or go to the ED if you have any new or worsening symptoms increased redness, swelling, pain, nausea, vomiting, fever, chills, etc...   Reviewed expectations re: course of current medical issues. Questions answered. Outlined signs and symptoms indicating need for more acute intervention. Patient verbalized understanding. After Visit Summary given.          Lestine Box, PA-C 02/15/19 1741

## 2019-02-16 ENCOUNTER — Other Ambulatory Visit: Payer: Self-pay | Admitting: Obstetrics and Gynecology

## 2019-05-03 ENCOUNTER — Ambulatory Visit: Payer: BLUE CROSS/BLUE SHIELD | Admitting: Obstetrics and Gynecology

## 2019-05-29 IMAGING — CT CT FOOT*L* W/O CM
1 of 2 series · 9 of 14 positions shown, 12 images · non-contrast
Comparison: MRI left foot 06/14/2016

CLINICAL DATA: No known injury, left foot pain for 3 months

EXAM:
CT OF THE LEFT FOOT WITHOUT CONTRAST
TECHNIQUE: Multidetector CT imaging of the left foot was performed according to
the standard protocol. Multiplanar CT image reconstructions were
also generated.

[Series 3: thin bone · axial · 0.46mm/px · z∈[-246,-103]mm · 9 of 597 slices shown, 12 images]
[im 60/597  soft-tissue]
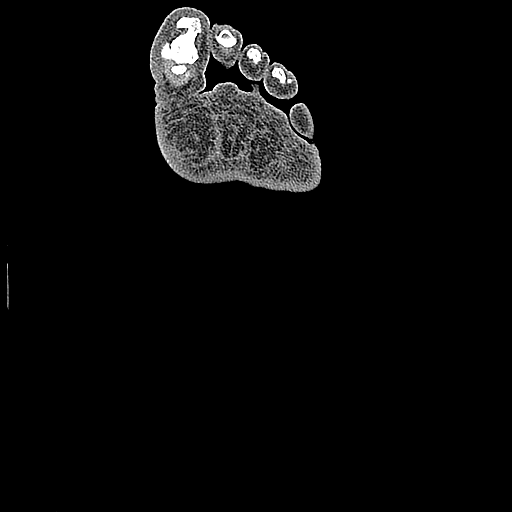
[im 60/597  bone]
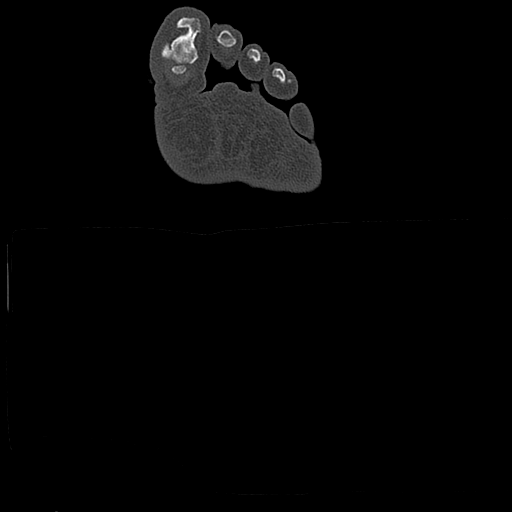
[im 120/597  bone]
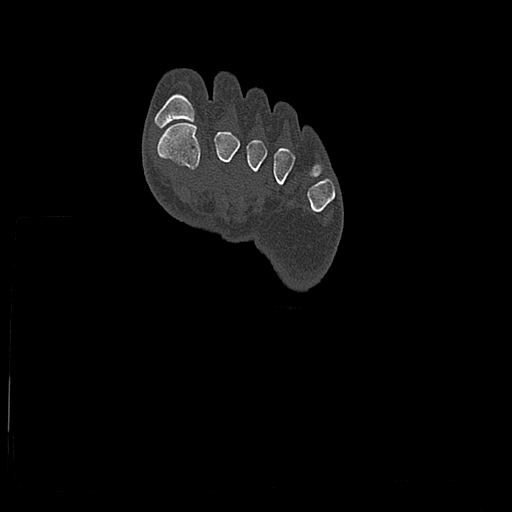
[im 179/597  bone]
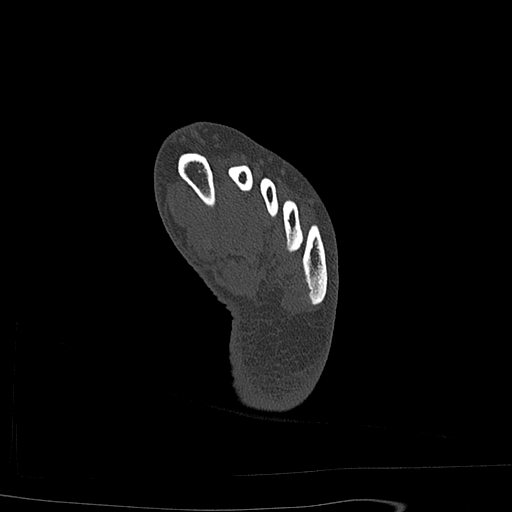
[im 239/597  bone]
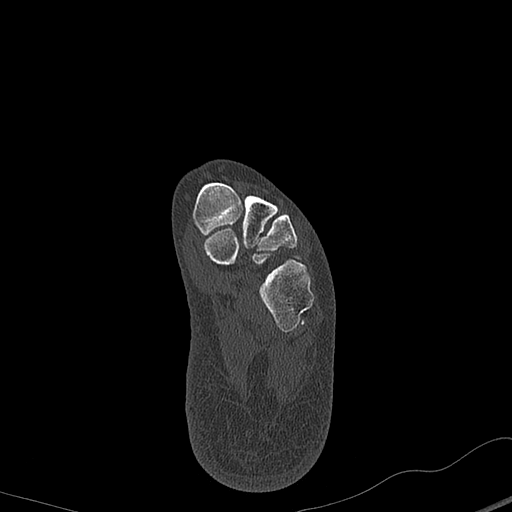
[im 299/597  soft-tissue]
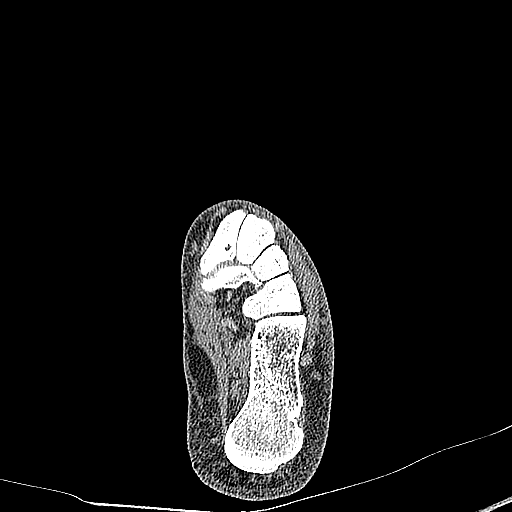
[im 299/597  bone]
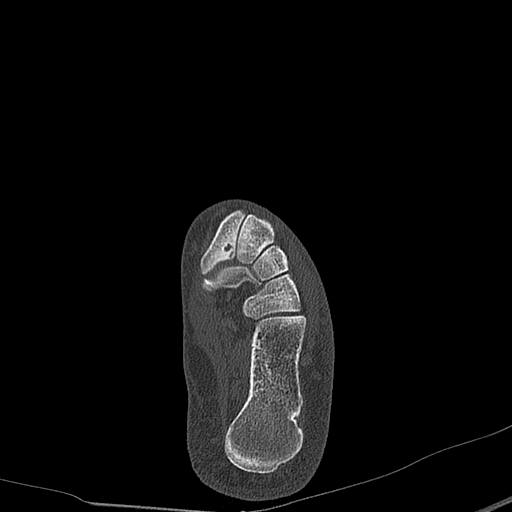
[im 358/597  bone]
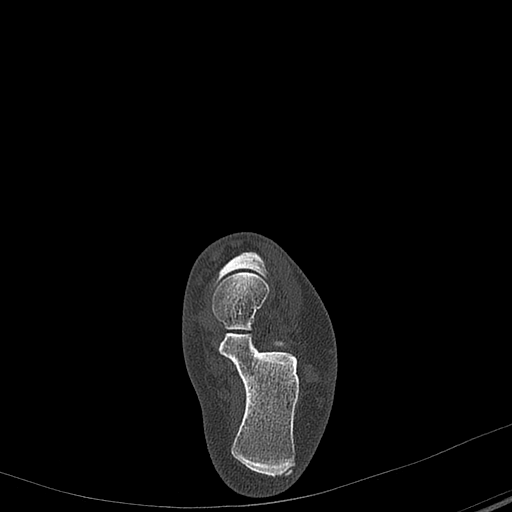
[im 418/597  bone]
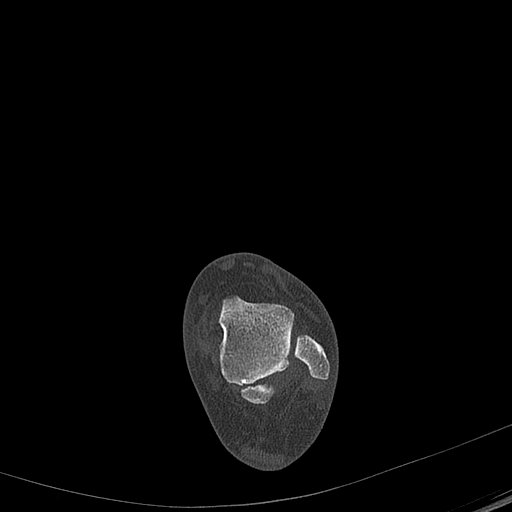
[im 477/597  bone]
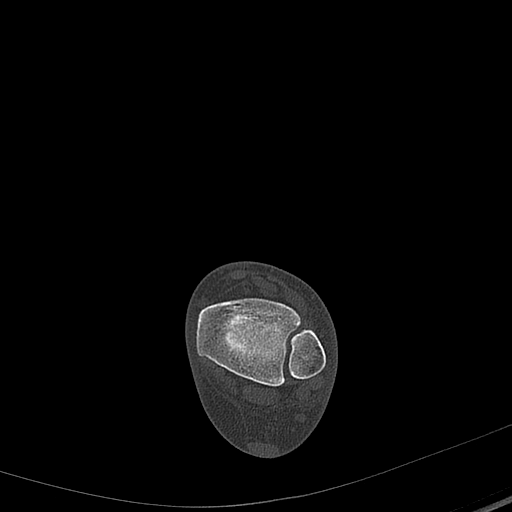
[im 537/597  soft-tissue]
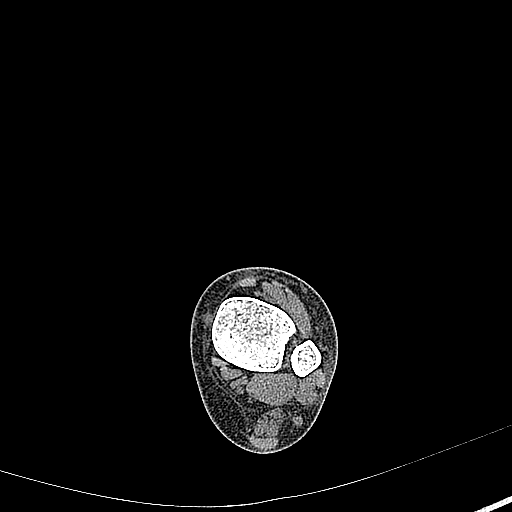
[im 537/597  bone]
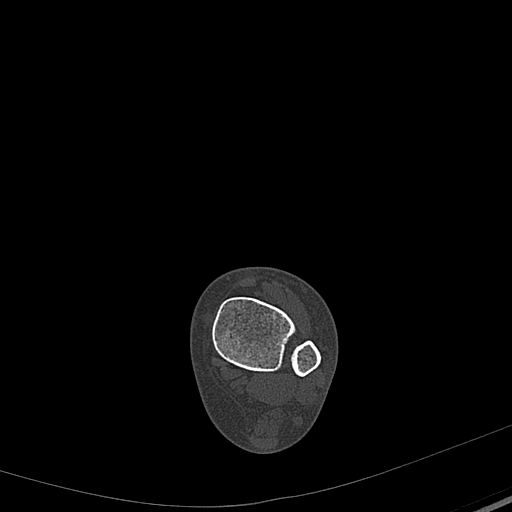

[9 of 14 positions shown; findings below may reference images not displayed]

FINDINGS: Bones/Joint/Cartilage

No fracture or dislocation. Normal alignment. No joint effusion.
Mild osteoarthritis of the navicular- medial cuneiform with
subchondral cystic changes in the medial cuneiform.

Prominent os trigonum with reactive changes on either side of the
pseudoarticulation.

Small plantar calcaneal spur.

Ligaments

Ligaments are suboptimally evaluated by CT.

Muscles and Tendons
Muscles are normal. Flexor, extensor, peroneal and Achilles tendons
are intact.

Soft tissue
No fluid collection or hematoma.  No soft tissue mass.
IMPRESSION: 1.  No acute osseous injury of the left foot.

## 2020-10-17 DIAGNOSIS — Z975 Presence of (intrauterine) contraceptive device: Secondary | ICD-10-CM | POA: Diagnosis not present

## 2020-10-17 DIAGNOSIS — Z6841 Body Mass Index (BMI) 40.0 and over, adult: Secondary | ICD-10-CM | POA: Diagnosis not present

## 2020-10-17 DIAGNOSIS — Z23 Encounter for immunization: Secondary | ICD-10-CM | POA: Diagnosis not present

## 2020-10-17 DIAGNOSIS — F419 Anxiety disorder, unspecified: Secondary | ICD-10-CM | POA: Diagnosis not present

## 2020-11-20 DIAGNOSIS — Z79899 Other long term (current) drug therapy: Secondary | ICD-10-CM | POA: Diagnosis not present

## 2020-11-20 DIAGNOSIS — Z6841 Body Mass Index (BMI) 40.0 and over, adult: Secondary | ICD-10-CM | POA: Diagnosis not present

## 2020-11-20 DIAGNOSIS — G43119 Migraine with aura, intractable, without status migrainosus: Secondary | ICD-10-CM | POA: Diagnosis not present

## 2020-11-20 DIAGNOSIS — R202 Paresthesia of skin: Secondary | ICD-10-CM | POA: Diagnosis not present

## 2020-11-20 DIAGNOSIS — R4189 Other symptoms and signs involving cognitive functions and awareness: Secondary | ICD-10-CM | POA: Diagnosis not present

## 2020-12-11 DIAGNOSIS — M5416 Radiculopathy, lumbar region: Secondary | ICD-10-CM | POA: Diagnosis not present

## 2020-12-11 DIAGNOSIS — G894 Chronic pain syndrome: Secondary | ICD-10-CM | POA: Diagnosis not present

## 2020-12-11 DIAGNOSIS — M5414 Radiculopathy, thoracic region: Secondary | ICD-10-CM | POA: Diagnosis not present

## 2020-12-11 DIAGNOSIS — Z6841 Body Mass Index (BMI) 40.0 and over, adult: Secondary | ICD-10-CM | POA: Diagnosis not present

## 2020-12-11 DIAGNOSIS — G43119 Migraine with aura, intractable, without status migrainosus: Secondary | ICD-10-CM | POA: Diagnosis not present

## 2020-12-11 DIAGNOSIS — M5412 Radiculopathy, cervical region: Secondary | ICD-10-CM | POA: Diagnosis not present

## 2020-12-11 DIAGNOSIS — Z0289 Encounter for other administrative examinations: Secondary | ICD-10-CM | POA: Diagnosis not present

## 2020-12-19 DIAGNOSIS — H353131 Nonexudative age-related macular degeneration, bilateral, early dry stage: Secondary | ICD-10-CM | POA: Diagnosis not present

## 2020-12-27 DIAGNOSIS — Q141 Congenital malformation of retina: Secondary | ICD-10-CM | POA: Diagnosis not present

## 2021-01-02 DIAGNOSIS — M5416 Radiculopathy, lumbar region: Secondary | ICD-10-CM | POA: Diagnosis not present

## 2021-01-23 DIAGNOSIS — Z30433 Encounter for removal and reinsertion of intrauterine contraceptive device: Secondary | ICD-10-CM | POA: Insufficient documentation

## 2021-01-28 DIAGNOSIS — I639 Cerebral infarction, unspecified: Secondary | ICD-10-CM

## 2021-01-28 HISTORY — DX: Cerebral infarction, unspecified: I63.9

## 2021-03-08 ENCOUNTER — Telehealth: Payer: Self-pay | Admitting: Internal Medicine

## 2021-03-08 NOTE — Telephone Encounter (Signed)
ok 

## 2021-03-08 NOTE — Telephone Encounter (Signed)
Good morning Dr. Hilarie Fredrickson,  This patient is requesting to come see you for an OV.  She has seen you in the past and is on recall with you for April of this year.  However, she did go to [facility] on 03/23/18 for an OV due to constipation.  This OV note is in Epic. Please let me know if you approve her transfer back to you.  Thank you.

## 2021-03-12 ENCOUNTER — Ambulatory Visit: Payer: Self-pay

## 2021-03-12 ENCOUNTER — Other Ambulatory Visit: Payer: Self-pay

## 2021-03-13 ENCOUNTER — Ambulatory Visit: Payer: Commercial Managed Care - PPO | Attending: Family Medicine

## 2021-03-13 DIAGNOSIS — R41841 Cognitive communication deficit: Secondary | ICD-10-CM | POA: Diagnosis present

## 2021-03-13 DIAGNOSIS — R482 Apraxia: Secondary | ICD-10-CM | POA: Insufficient documentation

## 2021-03-13 DIAGNOSIS — M6281 Muscle weakness (generalized): Secondary | ICD-10-CM | POA: Diagnosis present

## 2021-03-13 DIAGNOSIS — R278 Other lack of coordination: Secondary | ICD-10-CM | POA: Insufficient documentation

## 2021-03-13 DIAGNOSIS — I63532 Cerebral infarction due to unspecified occlusion or stenosis of left posterior cerebral artery: Secondary | ICD-10-CM | POA: Insufficient documentation

## 2021-03-14 NOTE — Therapy (Signed)
[place] MAIN [facility] SERVICES [address] [place], [place], [address] Phone: [phone]   Fax:  [phone]  Occupational Therapy Evaluation  Patient Details  Name: Bridget Beck MRN: [id number] Date of Birth: [birth year] Referring Provider (OT): Dr. Sharyn Creamer, PCP   Encounter Date: 03/13/2021   OT End of Session - 03/14/21 0828     Visit Number 1    Number of Visits 24    Date for OT Re-Evaluation 06/04/21    Authorization Type self pay    OT Start Time 0825    OT Stop Time 0930    OT Time Calculation (min) 65 min    Activity Tolerance Patient tolerated treatment well    Behavior During Therapy [facility] for tasks assessed/performed             Past Medical History:  Diagnosis Date   Depression    Managed   GERD (gastroesophageal reflux disease)    Hemorrhoids    Migraines    with aura    Past Surgical History:  Procedure Laterality Date   CERVICAL FUSION     x 2   INCONTINENCE SURGERY  2004   TUBAL LIGATION  2002    There were no vitals filed for this visit.   Subjective Assessment - 03/14/21 0808     Subjective  "I can't tie my shoes."    Pertinent History Recent L parietal infarct, hx of migraines, ACDF, chronic pain, neuropathy, depression    Limitations R hand numbness and weakness    Patient Stated Goals Improve RUE strength and coordination    Currently in Pain? No/denies    Pain Score 0-No pain               [facility] OT Assessment - 03/14/21 0001       Assessment   Medical Diagnosis CVA    Referring Provider (OT) Dr. Sharyn Creamer, PCP    Onset Date/Surgical Date 03/04/21    Hand Dominance Right    Next MD Visit neurologist Feb 28th    Prior Therapy no therapy for CVA      Precautions   Precautions None      Restrictions   Weight Bearing Restrictions No      Balance Screen   Has the patient fallen in the past 6 months No      Home  Environment   Family/patient expects to be discharged  to: Private residence    Living Arrangements Spouse/significant other   and 68 y/o daughter   Available Help at Discharge Family    Type of [place] One level    Alternate Level Stairs - Number of Steps 7 steps with a rail (pt also holds to husband)    Herbalist      Prior Function   Level of Independence Independent    Vocation Full time employment    Vocation Requirements Works at [employer] with her 5 dogs      ADL   Eating/Feeding Supervision/safety   extra time   Grooming Supervision/safety   difficulty with putting rubber band in hair for ponytail; extra time required   Upper Body Bathing Supervision/safety    Lower Body Bathing Supervision/safety    Upper Body Dressing Supervision/safety   extensive time to don  a bra and manage clothing fasteners   Lower Body Dressing Set up   assist to tie shoes   Toileting -  Hygiene Increase time   sometimes has to use L non-dominant hand   Tub/Shower Transfer Supervision/safety      IADL   Meal Prep Able to complete simple warm meal prep    Medication Management Is responsible for taking medication in correct dosages at correct time   pt reports spouse supervises   Financial Management Manages financial matters independently (budgets, writes checks, pays rent, bills goes to bank), collects and keeps track of income   bills on autopay     Mobility   Mobility Status Independent      Written Expression   Dominant Hand Right    Handwriting 100% legible;Increased time      Vision - History   Baseline Vision Wears glasses all the time      Vision Assessment   Ocular Range of Motion Within Functional Limits    Tracking/Visual Pursuits Able to track stimulus in all quads without difficulty    Saccades Within functional limits    Visual Fields No apparent deficits       Cognition   Overall Cognitive Status Impaired/Different from baseline   will request SLP eval; appears to have some STM deficits and slower processing     Observation/Other Assessments   Focus on Therapeutic Outcomes (FOTO)  57      Sensation   Light Touch Impaired by gross assessment   distal LUE impaired   Proprioception Appears Intact      Coordination   Gross Motor Movements are Fluid and Coordinated No    Fine Motor Movements are Fluid and Coordinated No    Right 9 Hole Peg Test 50 sec    Left 9 Hole Peg Test 28 sec      AROM   Overall AROM Comments BUEs WNL      Strength   Overall Strength Comments R shoulder 4/5, elbow and wrist flex/ext 4+, LUE 5/5      Hand Function   Right Hand Grip (lbs) 5    Right Hand Lateral Pinch 7 lbs    Right Hand 3 Point Pinch 2 lbs    Left Hand Grip (lbs) 60    Left Hand Lateral Pinch 18 lbs    Left 3 point pinch 14 lbs           Occupational Therapy Evaluation: Pt is a 51 y/o female s/p L parietal infarct, presenting today with R hand weakness, lack of coordination, and sensory impairment, impacting ADL performance when engaging R dominant hand.  Pt was working at [employer] prior to CVA and goal is to return to work.  Pt resides with spouse and 65 y/o daughter who are able to assist as needed with self care.  Pt will benefit from skilled OT to address deficits noted above in order to maximize functional use of R dominant hand.    Recommending SLP eval d/t pt/spouse reporting memory deficits post CVA, and noted slow processing at eval.     OT Education - 03/14/21 0828     Education Details Role of OT, goals, POC    Person(s) Educated Patient;Spouse    Methods Explanation;Verbal cues    Comprehension Verbalized understanding;Verbal cues required              OT Short Term Goals - 03/14/21 0836       OT SHORT TERM GOAL #1  Title Pt will be indep to perform HEP for increasing strength and coordination in RUE.    Baseline  Eval: issued pink theraputty, further training needed    Time 6    Period Weeks    Status New    Target Date 04/23/21               OT Long Term Goals - 03/14/21 0836       OT LONG TERM GOAL #1   Title Pt will increase FOTO score to 65 or better to indicate increased functional performance.    Baseline Eval: FOTO 57    Time 12    Period Weeks    Status New    Target Date 06/04/21      OT LONG TERM GOAL #2   Title Pt will be indep to tie shoes.    Baseline Eval: unable    Time 12    Period Weeks    Status New    Target Date 06/04/21      OT LONG TERM GOAL #3   Title Pt will be able to hold and operate cell phone with good accuracy and efficiency with R dominant hand.    Baseline Eval: using L non-dominant hand    Time 12    Period Weeks    Status New    Target Date 06/04/21      OT LONG TERM GOAL #4   Title Pt will increase R grip strength by 10 or more lbs to improve ability to hold and carry ADL supplies in R dominant hand    Baseline Eval: R grip 5 lbs (L 60)    Time 12    Period Weeks    Status New    Target Date 06/04/21      OT LONG TERM GOAL #5   Title Pt will increase dexterity for improved ability to manipulate ADL supplies in R dominant hand as demonstrated by improvement in 9 hole peg test score by 10 or more seconds.    Baseline Eval: R 50 sec, L 28 sec    Time 12    Period Weeks    Status New    Target Date 06/04/21                Plan - 03/14/21 5176     Clinical Impression Statement Pt is a 51 y/o female s/p L parietal infarct, presenting today with R hand weakness, lack of coordination, and sensory impairment, impacting ADL performance when engaging R dominant hand.  Pt was working at [employer] prior to CVA and goal is to return to work.  Pt resides with spouse and 77 y/o daughter who are able to assist as needed with self care.  Pt will benefit from skilled OT to address deficits noted above in order to maximize functional use of R  dominant hand.  Recommending SLP eval d/t pt/spouse reporting memory deficits post CVA, and noted slow processing at eval.    OT Occupational Profile and History Problem Focused Assessment - Including review of records relating to presenting problem    Occupational performance deficits (Please refer to evaluation for details): ADL's;IADL's;Work    Body Structure / Function / Physical Skills ADL;Coordination;GMC;UE functional use;Sensation;Decreased knowledge of use of DME;IADL;Dexterity;FMC;Strength    Cognitive Skills Memory;Understand;Thought    Rehab Potential Good    Clinical Decision Making Limited treatment options, no task modification necessary    Comorbidities Affecting Occupational Performance: May have comorbidities impacting occupational performance  Modification or Assistance to Complete Evaluation  No modification of tasks or assist necessary to complete eval    OT Frequency 2x / week    OT Duration 12 weeks    OT Treatment/Interventions Self-care/ADL training;Therapeutic exercise;DME and/or AE instruction;Cognitive remediation/compensation;Neuromuscular education;Therapeutic activities;Patient/family education    OT Home Exercise Plan issued pink theraputty    Recommended Other Services SLP eval    Consulted and Agree with Plan of Care Patient;Family member/caregiver    Family Member Consulted spouse, Abe People             Patient will benefit from skilled therapeutic intervention in order to improve the following deficits and impairments:   Body Structure / Function / Physical Skills: ADL, Coordination, GMC, UE functional use, Sensation, Decreased knowledge of use of DME, IADL, Dexterity, FMC, Strength Cognitive Skills: Memory, Understand, Thought     Visit Diagnosis: Apraxia  Muscle weakness (generalized)  Other lack of coordination    Problem List Patient Active Problem List   Diagnosis Date Noted   Colon cancer screening 05/08/2016   Rectal bleeding  05/08/2016   Achilles tendonitis 04/28/2016   Bowel movement symptom 12/07/2014   Intractable chronic migraine without aura and without status migrainosus 12/05/2014   Weight gain 12/01/2013   Left hip pain 12/01/2013   Migraine without aura 04/20/2013   Depression 04/26/2012   Lumbar back pain with radiculopathy affecting left lower extremity 02/09/2012   Memory changes 10/17/2011   Leta Speller, MS, OTR/L  Darleene Cleaver, OT 03/14/2021, 9:34 AM  [place] MAIN [facility] SERVICES [address] [place], [place], [address] Phone: [phone]   Fax:  [phone]  Name: Bridget Beck MRN: [id number] Date of Birth: 12-01-1970

## 2021-03-19 ENCOUNTER — Ambulatory Visit: Payer: Commercial Managed Care - PPO

## 2021-03-19 ENCOUNTER — Other Ambulatory Visit: Payer: Self-pay

## 2021-03-19 DIAGNOSIS — R278 Other lack of coordination: Secondary | ICD-10-CM

## 2021-03-19 DIAGNOSIS — M6281 Muscle weakness (generalized): Secondary | ICD-10-CM

## 2021-03-19 DIAGNOSIS — R482 Apraxia: Secondary | ICD-10-CM | POA: Diagnosis not present

## 2021-03-19 NOTE — Therapy (Signed)
[place] MAIN [facility] SERVICES [address] [place], [place], [address] Phone: [phone]   Fax:  [phone]  Occupational Therapy Treatment  Patient Details  Name: Bridget Beck MRN: [id number] Date of Birth: 1970/10/24 Referring Provider (OT): Dr. Sharyn Creamer, PCP   Encounter Date: 03/19/2021   OT End of Session - 03/19/21 1006     Visit Number 2    Number of Visits 24    Date for OT Re-Evaluation 06/04/21    Authorization Type [place]    OT Start Time [phone]    OT Stop Time 1015    OT Time Calculation (min) 43 min    Activity Tolerance Patient tolerated treatment well    Behavior During Therapy [facility] for tasks assessed/performed             Past Medical History:  Diagnosis Date   Depression    Managed   GERD (gastroesophageal reflux disease)    Hemorrhoids    Migraines    with aura    Past Surgical History:  Procedure Laterality Date   CERVICAL FUSION     x 2   INCONTINENCE SURGERY  2004   TUBAL LIGATION  2002    There were no vitals filed for this visit.   Subjective Assessment - 03/19/21 1002     Subjective  "I've really been working on my putty at home."    Pertinent History Recent L parietal infarct, hx of migraines, ACDF, chronic pain, neuropathy, depression    Limitations R hand numbness and weakness    Patient Stated Goals Improve RUE strength and coordination    Currently in Pain? No/denies    Pain Score 0-No pain    Pain Onset More than a month ago    Pain Onset More than a month ago            Occupational Therapy Treatment: Self Care: Practiced tying with large laces with garment laid on table top.  No issues.  OT encouraged pt to go home and practice with her own shoe laces.  OT inquired about pt's functional skills needed to return to work.  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  Pt continues to work with her putty at home and has greatly increased strength since eval last week, starting at 5lbs, and now 40 lbs today for R grip.  OT issued green putty (has been using pink), and encouraged pt to continue with gripping and pinching, and add rolling putty to simulate rolling dough.  Pt verbalized understanding.   Therapeutic Exercise: Facilitated hand strengthening with use of hand gripper set at 11.2# to remove jumbo pegs from pegboard x1 trial, then increased to 17.9# for 2 trials with good tolerance.  Pt picked up 1/2"-1" washers from dish and placed over vertical dowels to target small item pick up and reaching toward a target; demonstrated good accuracy with reaching forward, ipsilateral, and contralateral with RUE.  Worked with washers to pick up from table top, store in hand, and discard 1 at a time.  Occasional dropping of a washer, but mostly accurate, but requiring extra time to complete with R hand.      Response to Treatment: Pt continues to work with her putty at home and has greatly increased strength  since eval last week, starting at 5lbs, and now 40 lbs today for R grip.  OT issued green putty (has been using pink), and encouraged pt to continue with gripping and pinching, and add rolling putty to simulate rolling dough to prep for return to work.  Pt verbalized understanding.  Pt will continue to benefit from skilled OT to address R hand weakness and dexterity in R dominant hand to improve ability to hold, carry, and manipulate ADL supplies in R hand.      OT Education - 03/19/21 1002     Education Details hand exercises    Person(s) Educated Patient    Methods Explanation;Verbal cues    Comprehension Verbalized understanding;Verbal cues required;Returned demonstration;Need further instruction              OT Short Term Goals - 03/14/21 0836        OT SHORT TERM GOAL #1   Title Pt will be indep to perform HEP for increasing strength and coordination in RUE.    Baseline Eval: issued pink theraputty, further training needed    Time 6    Period Weeks    Status New    Target Date 04/23/21               OT Long Term Goals - 03/14/21 0836       OT LONG TERM GOAL #1   Title Pt will increase FOTO score to 65 or better to indicate increased functional performance.    Baseline Eval: FOTO 57    Time 12    Period Weeks    Status New    Target Date 06/04/21      OT LONG TERM GOAL #2   Title Pt will be indep to tie shoes.    Baseline Eval: unable    Time 12    Period Weeks    Status New    Target Date 06/04/21      OT LONG TERM GOAL #3   Title Pt will be able to hold and operate cell phone with good accuracy and efficiency with R dominant hand.    Baseline Eval: using L non-dominant hand    Time 12    Period Weeks    Status New    Target Date 06/04/21      OT LONG TERM GOAL #4   Title Pt will increase R grip strength by 10 or more lbs to improve ability to hold and carry ADL supplies in R dominant hand    Baseline Eval: R grip 5 lbs (L 60)    Time 12    Period Weeks    Status New    Target Date 06/04/21      OT LONG TERM GOAL #5   Title Pt will increase dexterity for improved ability to manipulate ADL supplies in R dominant hand as demonstrated by improvement in 9 hole peg test score by 10 or more seconds.    Baseline Eval: R 50 sec, L 28 sec    Time 12    Period Weeks    Status New    Target Date 06/04/21                Plan - 03/19/21 1321     Clinical Impression Statement Pt continues to work with her putty at home and has greatly increased strength since eval last week, starting at 5lbs, and now 40 lbs today for R grip.  OT issued green putty (has been using pink),  and encouraged pt to continue with gripping and pinching, and add rolling putty to simulate rolling dough to prep for return to  work.  Pt verbalized understanding.  Pt will continue to benefit from skilled OT to address R hand weakness and dexterity in R dominant hand to improve ability to hold, carry, and manipulate ADL supplies in R hand.    OT Occupational Profile and History Problem Focused Assessment - Including review of records relating to presenting problem    Occupational performance deficits (Please refer to evaluation for details): ADL's;IADL's;Work    Body Structure / Function / Physical Skills ADL;Coordination;GMC;UE functional use;Sensation;Decreased knowledge of use of DME;IADL;Dexterity;FMC;Strength    Cognitive Skills Memory;Understand;Thought    Rehab Potential Good    Clinical Decision Making Limited treatment options, no task modification necessary    Comorbidities Affecting Occupational Performance: May have comorbidities impacting occupational performance    Modification or Assistance to Complete Evaluation  No modification of tasks or assist necessary to complete eval    OT Frequency 2x / week    OT Duration 12 weeks    OT Treatment/Interventions Self-care/ADL training;Therapeutic exercise;DME and/or AE instruction;Cognitive remediation/compensation;Neuromuscular education;Therapeutic activities;Patient/family education    OT Home Exercise Plan issued pink theraputty    Recommended Other Services SLP eval    Consulted and Agree with Plan of Care Patient;Family member/caregiver    Family Member Consulted spouse, Abe People             Patient will benefit from skilled therapeutic intervention in order to improve the following deficits and impairments:   Body Structure / Function / Physical Skills: ADL, Coordination, GMC, UE functional use, Sensation, Decreased knowledge of use of DME, IADL, Dexterity, FMC, Strength Cognitive Skills: Memory, Understand, Thought     Visit Diagnosis: Muscle weakness (generalized)  Other lack of coordination    Problem List Patient Active Problem List    Diagnosis Date Noted   Colon cancer screening 05/08/2016   Rectal bleeding 05/08/2016   Achilles tendonitis 04/28/2016   Bowel movement symptom 12/07/2014   Intractable chronic migraine without aura and without status migrainosus 12/05/2014   Weight gain 12/01/2013   Left hip pain 12/01/2013   Migraine without aura 04/20/2013   Depression 04/26/2012   Lumbar back pain with radiculopathy affecting left lower extremity 02/09/2012   Memory changes 10/17/2011   Leta Speller, MS, OTR/L  Darleene Cleaver, OT 03/19/2021, 1:22 PM  [place] MAIN [facility] SERVICES [address] [place], [place], [address] Phone: [phone]   Fax:  [phone]  Name: Bridget Beck MRN: [id number] Date of Birth: 20-Jul-1970

## 2021-03-21 ENCOUNTER — Ambulatory Visit: Payer: Commercial Managed Care - PPO

## 2021-03-21 ENCOUNTER — Other Ambulatory Visit: Payer: Self-pay

## 2021-03-21 DIAGNOSIS — R278 Other lack of coordination: Secondary | ICD-10-CM

## 2021-03-21 DIAGNOSIS — M6281 Muscle weakness (generalized): Secondary | ICD-10-CM

## 2021-03-21 DIAGNOSIS — R482 Apraxia: Secondary | ICD-10-CM | POA: Diagnosis not present

## 2021-03-21 NOTE — Therapy (Signed)
[place] MAIN [facility] SERVICES [address] [place], [place], [address] Phone: [phone]   Fax:  [phone]  Occupational Therapy Treatment  Patient Details  Name: Peg Fifer Detlefsen MRN: [id number] Date of Birth: [birth year] Referring Provider (OT): Dr. Sharyn Creamer, PCP   Encounter Date: 03/21/2021   OT End of Session - 03/21/21 1507     Visit Number 3    Number of Visits 24    Date for OT Re-Evaluation 06/04/21    Authorization Type [place]    OT Start Time 0840    OT Stop Time [phone]    OT Time Calculation (min) 38 min    Activity Tolerance Patient tolerated treatment well    Behavior During Therapy [facility] for tasks assessed/performed             Past Medical History:  Diagnosis Date   Depression    Managed   GERD (gastroesophageal reflux disease)    Hemorrhoids    Migraines    with aura    Past Surgical History:  Procedure Laterality Date   CERVICAL FUSION     x 2   INCONTINENCE SURGERY  2004   TUBAL LIGATION  2002    There were no vitals filed for this visit.   Subjective Assessment - 03/21/21 1506     Subjective  "I shaved my legs today, but I had to switch over to my L hand towards the end."    Pertinent History Recent L parietal infarct, hx of migraines, ACDF, chronic pain, neuropathy, depression    Limitations R hand numbness and weakness    Patient Stated Goals Improve RUE strength and coordination    Currently in Pain? No/denies    Pain Score 0-No pain    Pain Onset More than a month ago    Pain Onset More than a month ago            Occupational Therapy Treatment: Therapeutic Exercise: Issued handout for theraputty exercises and instructed pt in strengthening and coordination exercises for R hand, including gross grasping, lateral/2 point/3 point pinching, digit abd/add, and digging coins out of putty.  Pt had brought her pink and grin putty from home today.  OT advised on specific colors to  use for various exercises based on difficulty; provided written cues on handout.  Able to return demo with intermittent vc for technique to improve quality of movement.  Encouraged completion 5-10 min, 1-2x per day.  Facilitated functional hand grasps/forearm rotation/pressing/pinching/turning maneuvers to simulate functional daily tasks at work and home with use of [place] board, performing 3-5 sets of each maneuver, rest breaks between each set.   Response to Treatment: Pt making excellent gains with R hand strength.  Pt reports that she was able to try shaving her legs today with her R hand, but did end up switching to her L hand towards the end d/t difficulty holding razor.  Able to progress theraputty exercises today and pt showed good return demo with written handout.  Pt returns to neurologist next Tues.  Planning 2 more sessions next week to continue with strengthening RUE and anticipate readiness to d/c OT end of next week.  Pt has SLP eval scheduled Monday morning.  Pt in agreement with plan.     OT Education - 03/21/21 1507     Education Details theraputty exercises    Person(s) Educated Patient    Methods Explanation;Verbal cues;Handout;Tactile cues;Demonstration    Comprehension Verbalized understanding;Verbal cues required;Returned  demonstration;Need further instruction              OT Short Term Goals - 03/14/21 0836       OT SHORT TERM GOAL #1   Title Pt will be indep to perform HEP for increasing strength and coordination in RUE.    Baseline Eval: issued pink theraputty, further training needed    Time 6    Period Weeks    Status New    Target Date 04/23/21               OT Long Term Goals - 03/14/21 0836       OT LONG TERM GOAL #1   Title Pt will increase FOTO score to 65 or better to indicate increased functional performance.    Baseline Eval: FOTO 57    Time 12    Period Weeks    Status New    Target Date 06/04/21      OT LONG TERM GOAL #2   Title Pt  will be indep to tie shoes.    Baseline Eval: unable    Time 12    Period Weeks    Status New    Target Date 06/04/21      OT LONG TERM GOAL #3   Title Pt will be able to hold and operate cell phone with good accuracy and efficiency with R dominant hand.    Baseline Eval: using L non-dominant hand    Time 12    Period Weeks    Status New    Target Date 06/04/21      OT LONG TERM GOAL #4   Title Pt will increase R grip strength by 10 or more lbs to improve ability to hold and carry ADL supplies in R dominant hand    Baseline Eval: R grip 5 lbs (L 60)    Time 12    Period Weeks    Status New    Target Date 06/04/21      OT LONG TERM GOAL #5   Title Pt will increase dexterity for improved ability to manipulate ADL supplies in R dominant hand as demonstrated by improvement in 9 hole peg test score by 10 or more seconds.    Baseline Eval: R 50 sec, L 28 sec    Time 12    Period Weeks    Status New    Target Date 06/04/21              Plan - 03/21/21 1516     Clinical Impression Statement Pt making excellent gains with R hand strength.  Pt reports that she was able to try shaving her legs today with her R hand, but did end up switching to her L hand towards the end d/t difficulty holding razor.  Able to progress theraputty exercises today and pt showed good return demo with written handout.  Pt returns to neurologist next Tues.  Planning 2 more sessions next week to continue with strengthening RUE and anticipate readiness to d/c OT end of next week.  Pt has SLP eval scheduled Monday morning.  Pt in agreement with plan.    OT Occupational Profile and History Problem Focused Assessment - Including review of records relating to presenting problem    Occupational performance deficits (Please refer to evaluation for details): ADL's;IADL's;Work    Body Structure / Function / Physical Skills ADL;Coordination;GMC;UE functional use;Sensation;Decreased knowledge of use of  DME;IADL;Dexterity;FMC;Strength    Cognitive Skills Memory;Understand;Thought    Rehab  Potential Good    Clinical Decision Making Limited treatment options, no task modification necessary    Comorbidities Affecting Occupational Performance: May have comorbidities impacting occupational performance    Modification or Assistance to Complete Evaluation  No modification of tasks or assist necessary to complete eval    OT Frequency 2x / week    OT Duration 12 weeks    OT Treatment/Interventions Self-care/ADL training;Therapeutic exercise;DME and/or AE instruction;Cognitive remediation/compensation;Neuromuscular education;Therapeutic activities;Patient/family education    OT Home Exercise Plan issued pink theraputty    Recommended Other Services SLP eval    Consulted and Agree with Plan of Care Patient;Family member/caregiver    Family Member Consulted spouse, Abe People             Patient will benefit from skilled therapeutic intervention in order to improve the following deficits and impairments:   Body Structure / Function / Physical Skills: ADL, Coordination, GMC, UE functional use, Sensation, Decreased knowledge of use of DME, IADL, Dexterity, FMC, Strength Cognitive Skills: Memory, Understand, Thought     Visit Diagnosis: Muscle weakness (generalized)  Other lack of coordination    Problem List Patient Active Problem List   Diagnosis Date Noted   Colon cancer screening 05/08/2016   Rectal bleeding 05/08/2016   Achilles tendonitis 04/28/2016   Bowel movement symptom 12/07/2014   Intractable chronic migraine without aura and without status migrainosus 12/05/2014   Weight gain 12/01/2013   Left hip pain 12/01/2013   Migraine without aura 04/20/2013   Depression 04/26/2012   Lumbar back pain with radiculopathy affecting left lower extremity 02/09/2012   Memory changes 10/17/2011   Leta Speller, MS, OTR/L  Darleene Cleaver, OT 03/21/2021, 3:16 PM  [place] MAIN [facility] SERVICES [address] [place], [place], [address] Phone: [phone]   Fax:  [phone]  Name: Veronnica Hennings Brecht MRN: [id number] Date of Birth: [birth year]

## 2021-03-26 ENCOUNTER — Encounter: Payer: Self-pay | Admitting: Podiatry

## 2021-03-26 ENCOUNTER — Ambulatory Visit: Payer: Commercial Managed Care - PPO

## 2021-03-26 ENCOUNTER — Other Ambulatory Visit: Payer: Self-pay

## 2021-03-26 ENCOUNTER — Ambulatory Visit: Payer: Commercial Managed Care - PPO | Admitting: Podiatry

## 2021-03-26 ENCOUNTER — Ambulatory Visit: Payer: Commercial Managed Care - PPO | Admitting: Speech Pathology

## 2021-03-26 DIAGNOSIS — M6281 Muscle weakness (generalized): Secondary | ICD-10-CM

## 2021-03-26 DIAGNOSIS — R278 Other lack of coordination: Secondary | ICD-10-CM

## 2021-03-26 DIAGNOSIS — R482 Apraxia: Secondary | ICD-10-CM | POA: Diagnosis not present

## 2021-03-26 DIAGNOSIS — I63532 Cerebral infarction due to unspecified occlusion or stenosis of left posterior cerebral artery: Secondary | ICD-10-CM

## 2021-03-26 DIAGNOSIS — L600 Ingrowing nail: Secondary | ICD-10-CM

## 2021-03-26 DIAGNOSIS — R41841 Cognitive communication deficit: Secondary | ICD-10-CM

## 2021-03-26 MED ORDER — NEOMYCIN-POLYMYXIN-HC 1 % OT SOLN: OTIC | 1 refills | Status: DC

## 2021-03-26 NOTE — Addendum Note (Signed)
Addended by: Stormy Fabian B on: 03/26/2021 01:40 PM   Modules accepted: Orders

## 2021-03-26 NOTE — Patient Instructions (Signed)

## 2021-03-26 NOTE — Therapy (Signed)
[place] MAIN [facility] SERVICES [address] [place], [place], [address] Phone: [phone]   Fax:  [phone]  Occupational Therapy Treatment  Patient Details  Name: Bridget Beck MRN: [id number] Date of Birth: [birth year] Referring Provider (OT): Dr. Sharyn Creamer, PCP   Encounter Date: 03/26/2021   OT End of Session - 03/26/21 1242     Visit Number 4    Number of Visits 24    Date for OT Re-Evaluation 06/04/21    Authorization Type [place]    OT Start Time [phone]    OT Stop Time 1015    OT Time Calculation (min) 40 min    Activity Tolerance Patient tolerated treatment well    Behavior During Therapy [facility] for tasks assessed/performed             Past Medical History:  Diagnosis Date   Depression    Managed   GERD (gastroesophageal reflux disease)    Hemorrhoids    Migraines    with aura    Past Surgical History:  Procedure Laterality Date   CERVICAL FUSION     x 2   INCONTINENCE SURGERY  2004   TUBAL LIGATION  2002    There were no vitals filed for this visit.   Subjective Assessment - 03/26/21 0957     Subjective  "I made 2 chicken pies over the weekend and mowed the lawn."    Pertinent History Recent L parietal infarct, hx of migraines, ACDF, chronic pain, neuropathy, depression    Limitations R hand numbness and weakness    Patient Stated Goals Improve RUE strength and coordination    Currently in Pain? Yes    Pain Score 6     Pain Location Back    Pain Orientation Lower    Pain Descriptors / Indicators Aching;Dull    Pain Type Chronic pain    Pain Onset More than a month ago    Pain Frequency Constant    Aggravating Factors  prolonged standing    Pain Relieving Factors sitting/rest/heat, pain meds    Effect of Pain on Daily Activities decreased standing tolerance    Multiple Pain Sites Yes    Pain Score 4    Pain Location Hand    Pain Orientation Right    Pain Descriptors / Indicators Aching     Pain Type Chronic pain    Pain Onset More than a month ago    Pain Frequency Intermittent    Aggravating Factors  overuse post CVa    Pain Relieving Factors rest/heat    Effect of Pain on Daily Activities ache after excessive use of hand            Occupational Therapy Treatment: Paraffin: 15 min R hand for pain management/muscle relaxation in prep for therapeutic exercises.  Therapeutic Exercise: Reviewed theraputty exercises with pt; pt demonstrating indep with exercises given from handout.  Pt reports she is consistent to use putty at home daily and has progressed to use green level resistance for all exercises.  Facilitated pinch strengthening with use of therapy resistant clothespins to target lateral and 3 point pinch of R/L hand.  Used moderate and heavy resistant clips to complete 2 sets for each pinch type for each hand.    Response to Treatment: Pt reported R hand pain today, but states she worked a lot with it over the weekend with extensive meal prep.  Pt was pleased to report that she made 2 chicken  pies, dessert, and mowed the lawn.  Pt tolerated paraffin well for R hand pain.  Anticipate readiness to discharge next visit as pt is now indep with HEP and using hand well with daily tasks.        OT Education - 03/26/21 1242     Education Details theraputty exercises    Person(s) Educated Patient    Methods Explanation;Verbal cues;Handout;Tactile cues;Demonstration    Comprehension Verbalized understanding;Returned demonstration              OT Short Term Goals - 03/14/21 0836       OT SHORT TERM GOAL #1   Title Pt will be indep to perform HEP for increasing strength and coordination in RUE.    Baseline Eval: issued pink theraputty, further training needed    Time 6    Period Weeks    Status New    Target Date 04/23/21               OT Long Term Goals - 03/14/21 0836       OT LONG TERM GOAL #1   Title Pt will increase FOTO score to 65 or better  to indicate increased functional performance.    Baseline Eval: FOTO 57    Time 12    Period Weeks    Status New    Target Date 06/04/21      OT LONG TERM GOAL #2   Title Pt will be indep to tie shoes.    Baseline Eval: unable    Time 12    Period Weeks    Status New    Target Date 06/04/21      OT LONG TERM GOAL #3   Title Pt will be able to hold and operate cell phone with good accuracy and efficiency with R dominant hand.    Baseline Eval: using L non-dominant hand    Time 12    Period Weeks    Status New    Target Date 06/04/21      OT LONG TERM GOAL #4   Title Pt will increase R grip strength by 10 or more lbs to improve ability to hold and carry ADL supplies in R dominant hand    Baseline Eval: R grip 5 lbs (L 60)    Time 12    Period Weeks    Status New    Target Date 06/04/21      OT LONG TERM GOAL #5   Title Pt will increase dexterity for improved ability to manipulate ADL supplies in R dominant hand as demonstrated by improvement in 9 hole peg test score by 10 or more seconds.    Baseline Eval: R 50 sec, L 28 sec    Time 12    Period Weeks    Status New    Target Date 06/04/21               Plan - 03/26/21 1252     Clinical Impression Statement Pt reported R hand pain today, but states she worked a lot with it over the weekend with extensive meal prep.  Pt was pleased to report that she made 2 chicken pies, dessert, and mowed the lawn.  Pt tolerated paraffin well for R hand pain.  Anticipate readiness to discharge next visit as pt is now indep with HEP and using hand well with daily tasks.    OT Occupational Profile and History Problem Focused Assessment - Including review of records relating to presenting problem  Occupational performance deficits (Please refer to evaluation for details): ADL's;IADL's;Work    Body Structure / Function / Physical Skills ADL;Coordination;GMC;UE functional use;Sensation;Decreased knowledge of use of  DME;IADL;Dexterity;FMC;Strength    Cognitive Skills Memory;Understand;Thought    Rehab Potential Good    Clinical Decision Making Limited treatment options, no task modification necessary    Comorbidities Affecting Occupational Performance: May have comorbidities impacting occupational performance    Modification or Assistance to Complete Evaluation  No modification of tasks or assist necessary to complete eval    OT Frequency 2x / week    OT Duration 12 weeks    OT Treatment/Interventions Self-care/ADL training;Therapeutic exercise;DME and/or AE instruction;Cognitive remediation/compensation;Neuromuscular education;Therapeutic activities;Patient/family education    OT Home Exercise Plan issued pink theraputty    Recommended Other Services SLP eval    Consulted and Agree with Plan of Care Patient;Family member/caregiver    Family Member Consulted spouse, Abe People             Patient will benefit from skilled therapeutic intervention in order to improve the following deficits and impairments:   Body Structure / Function / Physical Skills: ADL, Coordination, GMC, UE functional use, Sensation, Decreased knowledge of use of DME, IADL, Dexterity, FMC, Strength Cognitive Skills: Memory, Understand, Thought     Visit Diagnosis: Muscle weakness (generalized)  Other lack of coordination    Problem List Patient Active Problem List   Diagnosis Date Noted   Encounter for removal and reinsertion of intrauterine contraceptive device 01/23/2021   Pain medication agreement signed 09/10/2018   Chronic, continuous use of opioids 09/02/2018   Colon cancer screening 05/08/2016   Rectal bleeding 05/08/2016   Achilles tendonitis 04/28/2016   Bowel movement symptom 12/07/2014   Intractable chronic migraine without aura and without status migrainosus 12/05/2014   Weight gain 12/01/2013   Left hip pain 12/01/2013   Migraine without aura 04/20/2013   Depression 04/26/2012   Lumbar back pain with  radiculopathy affecting left lower extremity 02/09/2012   Memory changes 10/17/2011   Leta Speller, MS, OTR/L  Darleene Cleaver, OT 03/26/2021, 12:52 PM  [place] MAIN [facility] SERVICES [address] [place], [place], [address] Phone: [phone]   Fax:  [phone]  Name: Bridget Beck MRN: [id number] Date of Birth: November 03, 1970

## 2021-03-26 NOTE — Progress Notes (Signed)
Subjective:  Patient ID: Bridget Beck, female    DOB: 1970-10-19,  MRN: [id number] HPI Chief Complaint  Patient presents with   Toe Pain    Hallux right - both borders, ingrown x few weeks   New Patient (Initial Visit)    Est pt 2018    51 y.o. female presents with the above complaint.   ROS: Denies fever chills nausea vomiting muscle aches pains calf pain back pain chest pain shortness of breath.  Relates left parietal stroke February 2023  Past Medical History:  Diagnosis Date   Depression    Managed   GERD (gastroesophageal reflux disease)    Hemorrhoids    Migraines    with aura   Past Surgical History:  Procedure Laterality Date   CERVICAL FUSION     x 2   INCONTINENCE SURGERY  2004   TUBAL LIGATION  2002    Current Outpatient Medications:    NEOMYCIN-POLYMYXIN-HYDROCORTISONE (CORTISPORIN) 1 % SOLN OTIC solution, Apply 1-2 drops to toe BID after soaking, Disp: 10 mL, Rfl: 1   albuterol (VENTOLIN HFA) 108 (90 Base) MCG/ACT inhaler, SMARTSIG:2 Puff(s) By Mouth Every 4-6 Hours PRN, Disp: , Rfl:    aspirin EC 81 MG tablet, Take 81 mg by mouth daily. Swallow whole., Disp: , Rfl:    atorvastatin (LIPITOR) 80 MG tablet, Take 80 mg by mouth daily., Disp: , Rfl:    benzonatate (TESSALON) 100 MG capsule, Take 100 mg by mouth 3 (three) times daily as needed., Disp: , Rfl:    busPIRone (BUSPAR) 30 MG tablet, Take 30 mg by mouth 2 (two) times daily., Disp: , Rfl:    DULoxetine (CYMBALTA) 60 MG capsule, , Disp: , Rfl: 10   Fremanezumab-vfrm (AJOVY) 225 MG/1.5ML SOAJ, INJECT 225 MG UNDER THE SKIN EVERY 28 DAYS (Patient not taking: Reported on 03/13/2021), Disp: , Rfl:    furosemide (LASIX) 20 MG tablet, TAKE 1 TABLET BY MOUTH FOR UP TO 3 DAYS AS NEEDED FOR LEG SWELLING (Patient not taking: Reported on 03/13/2021), Disp: , Rfl:    HYDROcodone-acetaminophen (NORCO/VICODIN) 5-325 MG tablet, Take 1 tablet by mouth daily as needed., Disp: , Rfl:    ibuprofen (ADVIL) 800 MG tablet,  Take 1 tablet (800 mg total) by mouth 3 (three) times daily. with food, Disp: 42 tablet, Rfl: 0   Ketorolac Tromethamine 15.75 MG/SPRAY SOLN, Place into the nose., Disp: , Rfl:    lidocaine (LIDODERM) 5 %, Place 1 patch onto the skin daily. Remove & Discard patch within 12 hours or as directed by MD (Patient not taking: Reported on 03/13/2021), Disp: 30 patch, Rfl: 0   naproxen (NAPROSYN) 500 MG tablet, Take 1 tablet with sumatriptan tablet (Patient not taking: Reported on 03/13/2021), Disp: 20 tablet, Rfl: 2   ondansetron (ZOFRAN) 24 MG tablet, Take 24 mg by mouth daily as needed for nausea., Disp: , Rfl:    pantoprazole (PROTONIX) 40 MG tablet, TK 1 T PO BID 30 MINUTES BEFORE MEALS, Disp: , Rfl: 0   pregabalin (LYRICA) 150 MG capsule, Take by mouth., Disp: , Rfl:    pregabalin (LYRICA) 200 MG capsule, Take 200 mg by mouth 3 (three) times daily., Disp: , Rfl:    promethazine (PHENERGAN) 25 MG tablet, Take 1 tablet (25 mg total) by mouth every 6 (six) hours as needed for nausea. (Patient not taking: Reported on 03/13/2021), Disp: 40 tablet, Rfl: 3   QUEtiapine (SEROQUEL) 25 MG tablet, Take 25 mg by mouth at bedtime., Disp: , Rfl:  Riboflavin 400 MG TABS, Take by mouth. (Patient not taking: Reported on 03/13/2021), Disp: , Rfl:    SUMAtriptan (IMITREX) 100 MG tablet, , Disp: , Rfl:    SUMAtriptan Succinate 6 MG/0.5ML SOCT, Inject 6 Mg into skin at earliest onset of migraine, may repeat in 1 hour if headache persists, or reoccurs (Patient not taking: Reported on 03/13/2021), Disp: 6 mL, Rfl: 5   topiramate (TOPAMAX) 50 MG tablet, Take 50 mg by mouth daily., Disp: , Rfl:    traMADol (ULTRAM) 50 MG tablet, Take by mouth. (Patient not taking: Reported on 03/13/2021), Disp: , Rfl:    valACYclovir (VALTREX) 1000 MG tablet, Take 2 tablets (2,000 mg total) by mouth 2 (two) times daily. For 1 day.  Repeat as needed with future episodes., Disp: 8 tablet, Rfl: 1  Current Facility-Administered Medications:    0.9 %   sodium chloride infusion, 500 mL, Intravenous, Continuous, [name], [name], MD   levonorgestrel (MIRENA) 20 MCG/24HR IUD, , Intrauterine, Once, Schuman, Christanna R, MD   triamcinolone acetonide (KENALOG-40) injection 20 mg, 20 mg, Other, Once, Stover, Titorya, [name]  Allergies  Allergen Reactions   Buprenorphine Swelling   Pramipexole Other (See Comments)   Amitriptyline Other (See Comments)    Had severe migraine and pt could not see for 2 hours.   Fluoxetine Hcl Other (See Comments)    Fatigue   Methocarbamol Other (See Comments)    Stomach upset Stomach upset   Mirtazapine Other (See Comments)    "felt like a zombie" "felt like a zombie"   Prozac [Fluoxetine Hcl]     Fatigue    Review of Systems Objective:  There were no vitals filed for this visit.  General: Well developed, nourished, in no acute distress, alert and oriented x3   Dermatological: Skin is warm, dry and supple bilateral. Nails x 10 are well maintained; remaining integument appears unremarkable at this time. There are no open sores, no preulcerative lesions, no rash or signs of infection present.  Sharp incurvated nail margin along the tibial fibular border of the hallux right  Vascular: Dorsalis Pedis artery and Posterior Tibial artery pedal pulses are 2/4 bilateral with immedate capillary fill time. Pedal hair growth present. No varicosities and no lower extremity edema present bilateral.   Neruologic: Grossly intact via light touch bilateral. Vibratory intact via tuning fork bilateral. Protective threshold with Semmes Wienstein monofilament intact to all pedal sites bilateral. Patellar and Achilles deep tendon reflexes 2+ bilateral. No Babinski or clonus noted bilateral.   Musculoskeletal: No gross boney pedal deformities bilateral. No pain, crepitus, or limitation noted with foot and ankle range of motion bilateral. Muscular strength 5/5 in all groups tested bilateral.  Gait: Unassisted, Nonantalgic.     Radiographs:  None taken  Assessment & Plan:   Assessment: Ingrown toenail tibial fibular border hallux right  Plan: Chemical matricectomy was performed today to the tibial border the hallux right after local anesthetic was administered.  Tolerated procedure well without complications.  Was provided with both oral and written home-going instructions for the care and soaking of the toe as well as a prescription for Cortisporin Otic to be applied twice daily after soaking.     Jera Headings T. [place], [place]

## 2021-03-26 NOTE — Therapy (Signed)
[place] MAIN [facility] SERVICES [address] [place], [place], [address] Phone: [phone]   Fax:  [phone]  Speech Language Pathology Evaluation  Patient Details  Name: Bridget Beck MRN: [id number] Date of Birth: May 02, 1970 Referring Provider (SLP): Atha Starks   Encounter Date: 03/26/2021   End of Session - 03/26/21 1322     Visit Number 1    Number of Visits 25    Date for SLP Re-Evaluation 06/18/21    Authorization Type [employer] PPO    Authorization Time Period 03/26/2021 thru 06/18/2021    Authorization - Visit Number 1    Progress Note Due on Visit 1    SLP Start Time 0830    SLP Stop Time  0935    SLP Time Calculation (min) 65 min    Activity Tolerance Patient tolerated treatment well             Past Medical History:  Diagnosis Date   Depression    Managed   GERD (gastroesophageal reflux disease)    Hemorrhoids    Migraines    with aura    Past Surgical History:  Procedure Laterality Date   CERVICAL FUSION     x 2   INCONTINENCE SURGERY  2004   TUBAL LIGATION  2002    There were no vitals filed for this visit.   Subjective Assessment - 03/26/21 1315     Subjective pt pleasant, accompanied by her husband    Patient is accompained by: Family member    Currently in Pain? No/denies                SLP Evaluation [facility] - 03/26/21 1315       SLP Visit Information   SLP Received On 03/26/21    Referring Provider (SLP) Atha Starks    Onset Date 03/05/2021    Medical Diagnosis Left PCA CVA      General Information   HPI Pt is 51 year old right handed female who presented to [facility] on 03/05/2021 with right hand weakness. MRI revealed left parietal infarct and chronic small vessel ischemic disease. Past medical history is notable for migraines associated with visual aura, nausea/vomiting, photo/phonophobia, chronic pain, neuropathy and depression.     Behavioral/Cognition appropriate, appears anxious    Mobility Status ambulatory      Balance Screen   Has the patient fallen in the past 6 months No    Has the patient had a decrease in activity level because of a fear of falling?  No    Is the patient reluctant to leave their home because of a fear of falling?  No      Prior Functional Status   Cognitive/Linguistic Baseline Baseline deficits    Baseline deficit details prior to CVa pt expresses that she had memory problems     Lives With Spouse    Available Support Family    Vocation Full time employment      Cognition   Overall Cognitive Status History of cognitive impairments - at baseline    Attention Selective    Selective Attention Impaired    Selective Attention Impairment Verbal complex;Functional complex    Memory Impaired    Memory Impairment Retrieval deficit;Decreased recall of new information;Decreased short term memory    Decreased Short Term Memory Verbal basic;Functional basic    Awareness Appears intact    Problem Solving Impaired    Problem Solving Impairment Verbal complex;Functional complex  Executive Function Reasoning    Reasoning Impaired    Reasoning Impairment Verbal complex;Functional complex      Auditory Comprehension   Overall Auditory Comprehension Appears within functional limits for tasks assessed      Visual Recognition/Discrimination   Discrimination Within Function Limits      Reading Comprehension   Reading Status Within funtional limits      Expression   Primary Mode of Expression Verbal      Verbal Expression   Overall Verbal Expression Appears within functional limits for tasks assessed      Written Expression   Dominant Hand Right    Written Expression Within Functional Limits      Oral Motor/Sensory Function   Overall Oral Motor/Sensory Function Appears within functional limits for tasks assessed      Motor Speech   Overall Motor Speech Appears within functional limits  for tasks assessed      Standardized Assessments   Standardized Assessments  Cognitive Linguistic Quick Test   Multifactorial Memory Questionnaire              SLP Education - 03/26/21 1321     Education Details results of this assessment, ST POC    Person(s) Educated Patient;Spouse    Methods Explanation;Demonstration;Verbal cues;Handout    Comprehension Verbalized understanding;Need further instruction              SLP Short Term Goals - 03/26/21 1330       SLP SHORT TERM GOAL #1   Title Patient will recall information about daily routine/schedule using his phone calendar with min cues.    Baseline new goal    Time 10    Period --   sessions   Status New      SLP SHORT TERM GOAL #2   Title Patient/spouse will report daily completion of cognitive home activities 5/7 days.    Baseline new goal    Time 10    Period --   sessions   Status New              SLP Long Term Goals - 03/26/21 1329       SLP LONG TERM GOAL #1   Title Patient will demonstrate iADL tasks requiring multiple cognitive domains    Baseline new goal    Time 12    Period Weeks    Status New    Target Date 06/18/21      SLP LONG TERM GOAL #2   Title Patient will demonstrate mod-complex executive function tasks for home/community environments with 90% acc IND    Baseline new goal    Time 12    Period Weeks    Status New    Target Date 06/18/21              Plan - 03/26/21 1328     Clinical Impression Statement Pt presents with mild to moderate cognitive impairments c/b mild to moderate deficits in selective attention, moderate deficits in memory (specifically storage, retrieval, recall of new information), mild word finding deficits (likely related to memory deficits) and moderate impairments in executive function. Pt's deficits are further conceptualized by her scores on the Cognitive Linguistic Quick Test (CLQT) and the Multifactorial Memory Questionnaire (MMQ).    Cognitive  Linguistic Quick Test  The Cognitive Linguistic Quick Test (CLQT) was administered to assess the relative status of five cognitive domains: attention, memory, language, executive functioning, and visuospatial skills. Scores from 10 tasks were used to estimate severity ratings (for  age groups 18-69 years and 70-89 years) for each domain, a clock drawing task, as well as an overall composite severity rating of cognition.      Task    Score  Criterion Cut Scores  Personal Facts       7/8   8   Symbol Cancellation     12/12   11  Confrontation Naming    10/10   10  Clock Drawing      13/13   12  Story Retelling       3/10   6  Symbol Trails      6/10   9  Generative Naming      5/9   5  Design Memory      5/6   5  Mazes        7/8   7  Design Generation    1/13   6  Cognitive Domain  Composite Score Severity Rating  Attention   171/215  MILD   Memory   122/185  MODERATE  Executive Function  19/40   MODERATE  Language   25/37   MILD  Visuospatial Skills  78/105   MILD  Clock Drawing   13/13   WNL  Composite Severity Rating MILD     MULTIFACTORIAL MEMORY QUESTIONNAIRE (MMQ)  Administered patient self-reported outcome measure Multifactorial Memory Questionnaire (MMQ). The Multifactorial Memory Questionnaire [facility]) consists of three scales measuring separate aspects  of metamemory; Satisfaction, Ability and Strategy. Pt's responses are converted to T-Scores with severity levels based on pt's T-Score.   Severity Levels (T-score) Very Low - < 20 Low - 20 to 29 Below Average - 30-39 Average - 40 to 60 Above Average - 60 to 70 High - 71 to 80 Very High - > 80  Pt reports:  BELOW AVERAGE Memory Satisfaction (T-score: 30) BELOW AVERAGE Memory Ability (T-score: 36) HIGH use of Memory Strategies (T-score: 71)  Pt and her husband report some pre-existing memory problems and have an appointment with neurology on 03/27/2021.   Skilled ST is required to targeted the above mentioned cognitive  communication deficits to increase pt's functional independence, return to work and reduce caregiver burden.     Speech Therapy Frequency 2x / week    Duration 12 weeks    Treatment/Interventions SLP instruction and feedback;Functional tasks;Internal/external aids;Patient/family education    Potential to Achieve Goals Fair    Potential Considerations Ability to learn/carryover information;Previous level of function    Consulted and Agree with Plan of Care Patient;Family member/caregiver    Family Member Consulted pt's husband             Patient will benefit from skilled therapeutic intervention in order to improve the following deficits and impairments:   Cognitive communication deficit  Acute ischemic left PCA stroke [facility])    Problem List Patient Active Problem List   Diagnosis Date Noted   Encounter for removal and reinsertion of intrauterine contraceptive device 01/23/2021   Pain medication agreement signed 09/10/2018   Chronic, continuous use of opioids 09/02/2018   Colon cancer screening 05/08/2016   Rectal bleeding 05/08/2016   Achilles tendonitis 04/28/2016   Bowel movement symptom 12/07/2014   Intractable chronic migraine without aura and without status migrainosus 12/05/2014   Weight gain 12/01/2013   Left hip pain 12/01/2013   Migraine without aura 04/20/2013   Depression 04/26/2012   Lumbar back pain with radiculopathy affecting left lower extremity 02/09/2012   Memory changes 10/17/2011   Lonzell Dorris B.  Rutherford Nail M.S., CCC-SLP, [facility] Speech-Language Pathologist Rehabilitation Services Office [phone], [place] 03/26/2021, 1:34 PM  [place] MAIN [facility] SERVICES [address] [place], [place], [address] Phone: [phone]   Fax:  [phone]  Name: Bridget Beck MRN: [id number] Date of Birth: 1970/06/06

## 2021-03-29 ENCOUNTER — Encounter: Payer: Self-pay | Admitting: Occupational Therapy

## 2021-03-30 ENCOUNTER — Ambulatory Visit: Payer: Commercial Managed Care - PPO

## 2021-04-02 ENCOUNTER — Ambulatory Visit: Payer: Commercial Managed Care - PPO | Admitting: Speech Pathology

## 2021-04-02 ENCOUNTER — Encounter: Payer: Self-pay | Admitting: Occupational Therapy

## 2021-04-06 ENCOUNTER — Other Ambulatory Visit: Payer: Self-pay

## 2021-04-06 ENCOUNTER — Ambulatory Visit: Payer: Commercial Managed Care - PPO | Attending: Family Medicine | Admitting: Speech Pathology

## 2021-04-06 DIAGNOSIS — I63532 Cerebral infarction due to unspecified occlusion or stenosis of left posterior cerebral artery: Secondary | ICD-10-CM | POA: Diagnosis present

## 2021-04-06 DIAGNOSIS — M6281 Muscle weakness (generalized): Secondary | ICD-10-CM | POA: Insufficient documentation

## 2021-04-06 DIAGNOSIS — R278 Other lack of coordination: Secondary | ICD-10-CM | POA: Diagnosis present

## 2021-04-06 DIAGNOSIS — R41841 Cognitive communication deficit: Secondary | ICD-10-CM | POA: Diagnosis not present

## 2021-04-07 NOTE — Patient Instructions (Signed)
Make bills into re-occurring events ?Take notes in the notes section on calendar for events ?

## 2021-04-07 NOTE — Therapy (Signed)
[place] ?[place] [facility] REH[facility] SERVICES ?[place], [place], [address] ?Phone: [phone]   Fax:  [phone] ? ?Speech Language Pathology Treatment ? ?Patient Details  ?Name: Bridget Beck ?MRN: [id number] ?Date of Birth: 06-Aug-1970 ?Referring Provider (SLP): Anson Crofts Weeks ? ? ?Encounter Date: 04/06/2021 ? ? End of Session - 04/07/21 2225   ? ? Visit Number 2   ? Number of Visits 25   ? Date for SLP Re-Evaluation 06/18/21   ? Authorization Type [employer] PPO   ? Authorization Time Period 03/26/2021 thru 06/18/2021   ? Authorization - Visit Number 2   ? Progress Note Due on Visit 10   ? SLP Start Time 0800   ? SLP Stop Time  0900   ? SLP Time Calculation (min) 60 min   ? Activity Tolerance Patient tolerated treatment well   ? ?  ?  ? ?  ? ? ?Past Medical History:  ?Diagnosis Date  ? Depression   ? Managed  ? GERD (gastroesophageal reflux disease)   ? Hemorrhoids   ? Migraines   ? with aura  ? ? ?Past Surgical History:  ?Procedure Laterality Date  ? CERVICAL FUSION    ? x 2  ? INCONTINENCE SURGERY  2004  ? TUBAL LIGATION  2002  ? ? ?There were no vitals filed for this visit. ? ? Subjective Assessment - 04/07/21 2221   ? ? Subjective "whatever he said" referring to her husband's recall of OT appt time   ? Patient is accompained by: Family member   ? Currently in Pain? No/denies   ? ?  ?  ? ?  ? ? ? ? ? ? ? ? ADULT SLP TREATMENT - 04/07/21 0001   ? ?  ? Treatment Provided  ? Treatment provided Cognitive-Linquistic   ?  ? Cognitive-Linquistic Treatment  ? Treatment focused on Cognition;Patient/family/caregiver education   ? Skilled Treatment Skilled treatment session focused on pt's cognition goals, specifically use of compensatory memory aids for independent recall of information. SLP facilitated session by providing instruction in utilizing calendar app to schedule monthly re-occuring bills and therapy sessions. Further instruction was provided in putting  some notes into each activity to help with recall of information such as her granddaughter's soccer game.   ? ?  ?  ? ?  ? ? ? SLP Education - 04/07/21 2224   ? ? Education Details using external memory aids for recall of information instead of relying on her husband   ? Person(s) Educated Patient;Spouse   ? Methods Explanation;Demonstration;Verbal cues   ? Comprehension Verbalized understanding;Returned demonstration;Need further instruction   ? ?  ?  ? ?  ? ? ? SLP Short Term Goals - 03/26/21 1330   ? ?  ? SLP SHORT TERM GOAL #1  ? Title Patient will recall information about daily routine/schedule using his phone calendar with min cues.   ? Baseline new goal   ? Time 10   ? Period --   sessions  ? Status New   ?  ? SLP SHORT TERM GOAL #2  ? Title Patient/spouse will report daily completion of cognitive home activities 5/7 days.   ? Baseline new goal   ? Time 10   ? Period --   sessions  ? Status New   ? ?  ?  ? ?  ? ? ? SLP Long Term Goals - 03/26/21 1329   ? ?  ? SLP LONG TERM  GOAL #1  ? Title Patient will demonstrate iADL tasks requiring multiple cognitive domains   ? Baseline new goal   ? Time 12   ? Period Weeks   ? Status New   ? Target Date 06/18/21   ?  ? SLP LONG TERM GOAL #2  ? Title Patient will demonstrate mod-complex executive function tasks for home/community environments with 90% acc IND   ? Baseline new goal   ? Time 12   ? Period Weeks   ? Status New   ? Target Date 06/18/21   ? ?  ?  ? ?  ? ? ? Plan - 04/07/21 2225   ? ? Clinical Impression Statement Pt is very eager to participate in ST sessions and arrives with her husband. Pt frequently looks to her husband to help with recall of information as such she is not independent with recall or use of external memory aids. Extensive education provided to pt and her husband on need for pt to use aids to help with recall of information. Pt already has extensive information on her calendar app. While pt and her husband voice understanding, they will  likely benefit from further instruction and therefore need further skilled ST intervention to target her mild-moderate cognitive impairment.   ? Speech Therapy Frequency 2x / week   ? Duration 12 weeks   ? Treatment/Interventions SLP instruction and feedback;Functional tasks;Internal/external aids;Patient/family education   ? Potential to Achieve Goals Good   ? Potential Considerations Ability to learn/carryover information;Previous level of function   ? SLP Home Exercise Plan provided, see pt instructions section   ? Consulted and Agree with Plan of Care Patient;Family member/caregiver   ? Family Member Consulted pt's husband   ? ?  ?  ? ?  ? ? ?Patient will benefit from skilled therapeutic intervention in order to improve the following deficits and impairments:   ?Cognitive communication deficit ? ?Acute ischemic left PCA stroke ([place]) ? ? ? ?Problem List ?Patient Active Problem List  ? Diagnosis Date Noted  ? Encounter for removal and reinsertion of intrauterine contraceptive device 01/23/2021  ? Pain medication agreement signed 09/10/2018  ? Chronic, continuous use of opioids 09/02/2018  ? Colon cancer screening 05/08/2016  ? Rectal bleeding 05/08/2016  ? Achilles tendonitis 04/28/2016  ? Bowel movement symptom 12/07/2014  ? Intractable chronic migraine without aura and without status migrainosus 12/05/2014  ? Weight gain 12/01/2013  ? Left hip pain 12/01/2013  ? Migraine without aura 04/20/2013  ? Depression 04/26/2012  ? Lumbar back pain with radiculopathy affecting left lower extremity 02/09/2012  ? Memory changes 10/17/2011  ? ?[place] B. Rutherford Nail, M.S., CCC-SLP, CBIS ?Speech-Language Pathologist ?Rehabilitation Services ?Office [phone] ? ?Kemontae Dunklee, CCC-SLP ?04/07/2021, 10:40 PM ? ?[place] ?[place] [facility] RE[facility] SERVICES ?[place], [place], [address] ?Phone: [phone]   Fax:  [phone] ? ? ?Name: Kathya Wilz Brodrick ?MRN: [id number] ?Date of Birth: 1970/05/14 ? ?

## 2021-04-09 ENCOUNTER — Ambulatory Visit: Payer: Commercial Managed Care - PPO | Admitting: Speech Pathology

## 2021-04-09 ENCOUNTER — Other Ambulatory Visit: Payer: Self-pay

## 2021-04-09 ENCOUNTER — Encounter: Payer: Self-pay | Admitting: Occupational Therapy

## 2021-04-09 DIAGNOSIS — R41841 Cognitive communication deficit: Secondary | ICD-10-CM

## 2021-04-09 DIAGNOSIS — I63532 Cerebral infarction due to unspecified occlusion or stenosis of left posterior cerebral artery: Secondary | ICD-10-CM

## 2021-04-10 NOTE — Therapy (Signed)
[place] ?[place] [facility] RE[facility] SERVICES ?[place], [place], [address] ?Phone: [phone]   Fax:  [phone] ? ?Speech Language Pathology Treatment ? ?Patient Details  ?Name: Bridget Beck ?MRN: [id number] ?Date of Birth: Nov 06, 1970 ?Referring Provider (SLP): Anson Crofts Weeks ? ? ?Encounter Date: 04/09/2021 ? ? End of Session - 04/10/21 4259   ? ? Visit Number 3   ? Number of Visits 25   ? Date for SLP Re-Evaluation 06/18/21   ? Authorization Type [employer] PPO   ? Authorization Time Period 03/26/2021 thru 06/18/2021   ? Authorization - Visit Number 3   ? Progress Note Due on Visit 10   ? SLP Start Time 0800   ? SLP Stop Time  0900   ? SLP Time Calculation (min) 60 min   ? Activity Tolerance Patient tolerated treatment well   ? ?  ?  ? ?  ? ? ?Past Medical History:  ?Diagnosis Date  ? Depression   ? Managed  ? GERD (gastroesophageal reflux disease)   ? Hemorrhoids   ? Migraines   ? with aura  ? ? ?Past Surgical History:  ?Procedure Laterality Date  ? CERVICAL FUSION    ? x 2  ? INCONTINENCE SURGERY  2004  ? TUBAL LIGATION  2002  ? ? ?There were no vitals filed for this visit. ? ? Subjective Assessment - 04/10/21 0920   ? ? Subjective "I took a picture of my desk"   ? Patient is accompained by: Family member   ? Currently in Pain? No/denies   ? ?  ?  ? ?  ? ? ? ? ? ? ? ? ADULT SLP TREATMENT - 04/10/21 0001   ? ?  ? Treatment Provided  ? Treatment provided Cognitive-Linquistic   ?  ? Cognitive-Linquistic Treatment  ? Treatment focused on Cognition;Patient/family/caregiver education   ? Skilled Treatment Skilled treatment session focused on pt's cognitive impairments, specifically recall of information. SLP facilitated session by providing the following interventions:     ? ?Use of external memory aids for recall of information: pt was Mod I using her calendar to recall events from weekend     ? ?Task organization: SLP provided skilled written information on  ways to organize her desk, specifically guidelines on how long to keep medical bills, utilities, bank statements and sequence for sorting into basic categories and then subdividing by company   ? ?  ?  ? ?  ? ? ? SLP Education - 04/10/21 0921   ? ? Education Details organizational skills   ? Person(s) Educated Patient;Spouse   ? Methods Explanation;Demonstration;Verbal cues;Handout   ? Comprehension Verbalized understanding;Returned demonstration;Need further instruction   ? ?  ?  ? ?  ? ? ? SLP Short Term Goals - 03/26/21 1330   ? ?  ? SLP SHORT TERM GOAL #1  ? Title Patient will recall information about daily routine/schedule using his phone calendar with min cues.   ? Baseline new goal   ? Time 10   ? Period --   sessions  ? Status New   ?  ? SLP SHORT TERM GOAL #2  ? Title Patient/spouse will report daily completion of cognitive home activities 5/7 days.   ? Baseline new goal   ? Time 10   ? Period --   sessions  ? Status New   ? ?  ?  ? ?  ? ? ? SLP Long Term Goals -  03/26/21 1329   ? ?  ? SLP LONG TERM GOAL #1  ? Title Patient will demonstrate iADL tasks requiring multiple cognitive domains   ? Baseline new goal   ? Time 12   ? Period Weeks   ? Status New   ? Target Date 06/18/21   ?  ? SLP LONG TERM GOAL #2  ? Title Patient will demonstrate mod-complex executive function tasks for home/community environments with 90% acc IND   ? Baseline new goal   ? Time 12   ? Period Weeks   ? Status New   ? Target Date 06/18/21   ? ?  ?  ? ?  ? ? ? Plan - 04/10/21 0922   ? ? Clinical Impression Statement Pt reports that she drove last Friday and had difficulty remembering how to get to known [place] location. Pt used her maps on phone to find location. She also expresses anxiety towards organizing her desk. Pt continues to be responsive to therapy with immediate implementation of strategies. Prognosis for functional independence continues to be good with skilled ST intervention.   ? Speech Therapy Frequency 2x / week   ?  Duration 12 weeks   ? Treatment/Interventions SLP instruction and feedback;Functional tasks;Internal/external aids;Patient/family education   ? Potential to Achieve Goals Good   ? Potential Considerations Ability to learn/carryover information;Previous level of function   ? SLP Home Exercise Plan provided, see pt instructions section   ? Consulted and Agree with Plan of Care Patient;Family member/caregiver   ? Family Member Consulted pt's husband   ? ?  ?  ? ?  ? ? ?Patient will benefit from skilled therapeutic intervention in order to improve the following deficits and impairments:   ?Cognitive communication deficit ? ?Acute ischemic left PCA stroke ([place]) ? ? ? ?Problem List ?Patient Active Problem List  ? Diagnosis Date Noted  ? Encounter for removal and reinsertion of intrauterine contraceptive device 01/23/2021  ? Pain medication agreement signed 09/10/2018  ? Chronic, continuous use of opioids 09/02/2018  ? Colon cancer screening 05/08/2016  ? Rectal bleeding 05/08/2016  ? Achilles tendonitis 04/28/2016  ? Bowel movement symptom 12/07/2014  ? Intractable chronic migraine without aura and without status migrainosus 12/05/2014  ? Weight gain 12/01/2013  ? Left hip pain 12/01/2013  ? Migraine without aura 04/20/2013  ? Depression 04/26/2012  ? Lumbar back pain with radiculopathy affecting left lower extremity 02/09/2012  ? Memory changes 10/17/2011  ? ?[place] B. Rutherford Nail, M.S., CCC-SLP, CBIS ?Speech-Language Pathologist ?Rehabilitation Services ?Office [phone] ? ?[place], [place] ?04/10/2021, 9:23 AM ? ?[place] ?[place] [facility] ?[place], [place], [address] ?Phone: [phone]   Fax:  [phone] ? ? ?Name: Bridget Beck ?MRN: [id number] ?Date of Birth: [birth year] ? ?

## 2021-04-10 NOTE — Patient Instructions (Signed)
Organize paperwork according to basic categories ?Continue using calendar as a journal for recall of information/activities ?

## 2021-04-11 ENCOUNTER — Ambulatory Visit: Payer: Commercial Managed Care - PPO | Admitting: Podiatry

## 2021-04-12 ENCOUNTER — Other Ambulatory Visit: Payer: Self-pay

## 2021-04-12 ENCOUNTER — Ambulatory Visit: Payer: Commercial Managed Care - PPO

## 2021-04-12 ENCOUNTER — Ambulatory Visit: Payer: Commercial Managed Care - PPO | Admitting: Speech Pathology

## 2021-04-12 DIAGNOSIS — R41841 Cognitive communication deficit: Secondary | ICD-10-CM | POA: Diagnosis not present

## 2021-04-12 NOTE — Therapy (Signed)
[place] ?[facility] ?[place], [place], [address] ?Phone: [phone]   Fax:  [phone] ? ?Occupational Therapy Treatment ? ?Patient Details  ?Name: Bridget Beck ?MRN: [id number] ?Date of Birth: 06/08/1970 ?Referring Provider (OT): Dr. Sharyn Creamer, PCP ? ? ?Encounter Date: 04/12/2021 ? ? OT End of Session - 04/12/21 1652   ? ? Visit Number 5   ? Number of Visits 24   ? Date for OT Re-Evaluation 06/04/21   ? Authorization Type [employer]   ? OT Start Time [phone]   ? OT Stop Time 0955   ? OT Time Calculation (min) 50 min   ? Activity Tolerance Patient tolerated treatment well   ? Behavior During Therapy [facility] for tasks assessed/performed   ? ?  ?  ? ?  ? ? ?Past Medical History:  ?Diagnosis Date  ? Depression   ? Managed  ? GERD (gastroesophageal reflux disease)   ? Hemorrhoids   ? Migraines   ? with aura  ? ? ?Past Surgical History:  ?Procedure Laterality Date  ? CERVICAL FUSION    ? x 2  ? INCONTINENCE SURGERY  2004  ? TUBAL LIGATION  2002  ? ? ?There were no vitals filed for this visit. ? ? Subjective Assessment - 04/12/21 0906   ? ? Subjective  "My back is stiff."   ? Pertinent History Recent L parietal infarct, hx of migraines, ACDF, chronic pain, neuropathy, depression   ? Limitations R hand numbness and weakness   ? Patient Stated Goals Improve RUE strength and coordination   ? Currently in Pain? Yes   ? Pain Score 4    ? Pain Location Back   ? Pain Orientation Lower   ? Pain Descriptors / Indicators Aching;Tightness   ? Pain Type Chronic pain   ? Pain Onset More than a month ago   ? Pain Frequency Constant   ? Aggravating Factors  prolonged standing   ? Pain Relieving Factors sitting/rest/heat, pain meds   ? Effect of Pain on Daily Activities decreased standing tolerance   ? Multiple Pain Sites No   ? Pain Onset More than a month ago   ? ?  ?  ? ?  ? ? ? ? ? [facility] OT Assessment - 04/12/21 0001   ? ?  ? ADL  ? Lower Body Bathing Independent    ? Upper Body Dressing Independent   ? Lower Body Dressing Independent   ? Toileting -  Hygiene Independent   ? Tub/Shower Transfer Independent   ?  ? IADL  ? Meal Prep Plans, prepares and serves adequate meals independently   has dropped dishes recently when using R hand  ?  ? Mobility  ? Mobility Status Independent   ?  ? Observation/Other Assessments  ? Focus on Therapeutic Outcomes (FOTO)  72   ?  ? Sensation  ? Light Touch Impaired by gross assessment   distal RUE impaired  ? Proprioception Impaired by gross assessment   ?  ? Coordination  ? Right 9 Hole Peg Test 27 sec   ? Left 9 Hole Peg Test 26 sec   ?  ? Strength  ? Overall Strength Comments R shoulder 4/5, R elbow and wrist flex/ext 5/5   ?  ? Hand Function  ? Right Hand Grip (lbs) 36   ? Right Hand Lateral Pinch 19 lbs   ? Right Hand 3 Point Pinch 17 lbs   ?  Left Hand Grip (lbs) 46   ? Left Hand Lateral Pinch 20 lbs   ? Left 3 point pinch 22 lbs   ? ?  ?  ? ?  ? ?Occupational Therapy Treatment: ?Therapeutic Exercise: ?Objective measures taken, goals added.  Plan was for discharge this session, but pt had not had OT since 03/26/21.  In reassessing for discharge, found additional deficits to address, including some residual shoulder weakness and RUE impairment related to decreased light tough and proprioception after pt reported recently dropping a dish in the kitchen.  Pt further described that sometimes her arm doesn't feel like it's a part of her body or that she didn't always know what the R hand was doing if not looking at it.  OT educated pt on her diminished light touch and proprioceptive impairment throughout RUE and reinforced need to look at R hand when using it.  Further training needed.  HEP reviewed.  Encouraged pt to progress to green putty only.  Will plan to add shoulder strengthening for RUE next session.  Also plan to decrease pt to 1x weekly to address OT goals.  Pt in agreement with plan.  ? ? ? ? OT Education - 04/12/21 1651   ? ?  Education Details Compensatory strategies related to decreased light touch and proprioception in RUE.   ? Person(s) Educated Patient   ? Methods Explanation;Verbal cues;Demonstration   ? Comprehension Verbalized understanding;Verbal cues required;Need further instruction   ? ?  ?  ? ?  ? ? ? OT Short Term Goals - 03/14/21 0836   ? ?  ? OT SHORT TERM GOAL #1  ? Title Pt will be indep to perform HEP for increasing strength and coordination in RUE.   ? Baseline Eval: issued pink theraputty, further training needed   ? Time 6   ? Period Weeks   ? Status New   ? Target Date 04/23/21   ? ?  ?  ? ?  ? ? ? ? OT Long Term Goals - 04/12/21 1654   ? ?  ? OT LONG TERM GOAL #1  ? Title Pt will increase FOTO score to 65 or better to indicate increased functional performance.   ? Baseline Eval: FOTO 57   ? Time 12   ? Period Weeks   ? Status New   ? Target Date 06/04/21   ?  ? OT LONG TERM GOAL #2  ? Title Pt will be indep to tie shoes.   ? Baseline Eval: unable   ? Time 12   ? Period Weeks   ? Status New   ? Target Date 06/04/21   ?  ? OT LONG TERM GOAL #3  ? Title Pt will be able to hold and operate cell phone with good accuracy and efficiency with R dominant hand.   ? Baseline Eval: using L non-dominant hand   ? Time 12   ? Period Weeks   ? Status New   ? Target Date 06/04/21   ?  ? OT LONG TERM GOAL #4  ? Title Pt will increase R grip strength by 10 or more lbs to improve ability to hold and carry ADL supplies in R dominant hand   ? Baseline Eval: R grip 5 lbs (L 60)   ? Time 12   ? Period Weeks   ? Status New   ? Target Date 06/04/21   ?  ? OT LONG TERM GOAL #5  ? Title Pt will increase  dexterity for improved ability to manipulate ADL supplies in R dominant hand as demonstrated by improvement in 9 hole peg test score by 10 or more seconds.   ? Baseline Eval: R 50 sec, L 28 sec   ? Time 12   ? Period Weeks   ? Status New   ? Target Date 06/04/21   ?  ? Long Term Additional Goals  ? Additional Long Term Goals Yes   ?  ? OT LONG  TERM GOAL #6  ? Title Pt will be indep to verbalize and carry over compensatory strategies r/t decreased light touch and proprioception throughout RUE to decrease risk of injury or dropping ADL supplies when using R hand.   ? Baseline Eval: pt reports dropping dishes when holding with R hand.   ? Time 8   ? Period Weeks   ? Status New   ? Target Date 06/04/21   ? ?  ?  ? ?  ? ? ? ? ? Plan - 04/12/21 1653   ? ? Clinical Impression Statement Objective measures taken, goals added.  Plan was for discharge this session, but pt had not had OT since 03/26/21.  In reassessing for discharge, found additional deficits to address, including some residual shoulder weakness and RUE impairment related to decreased light tough and proprioception after pt reported recently dropping a dish in the kitchen.  Pt further described that sometimes her arm doesn't feel like it's a part of her body or that she didn't always know what the R hand was doing if not looking at it.  OT educated pt on her diminished light touch and proprioceptive impairment throughout RUE and reinforced need to look at R hand when using it.  Further training needed.  HEP reviewed.  Encouraged pt to progress to green putty only.  Will plan to add shoulder strengthening for RUE next session.  Also plan to decrease pt to 1x weekly to address OT goals.  Pt in agreement with plan.   ? OT Occupational Profile and History Problem Focused Assessment - Including review of records relating to presenting problem   ? Occupational performance deficits (Please refer to evaluation for details): ADL's;IADL's;Work   ? Body Structure / Function / Physical Skills ADL;Coordination;GMC;UE functional use;Sensation;Decreased knowledge of use of DME;IADL;Dexterity;FMC;Strength   ? Cognitive Skills Memory;Understand;Thought   ? Rehab Potential Good   ? Clinical Decision Making Limited treatment options, no task modification necessary   ? Comorbidities Affecting Occupational Performance:  May have comorbidities impacting occupational performance   ? Modification or Assistance to Complete Evaluation  No modification of tasks or assist necessary to complete eval   ? OT Frequency 1x / week   ? O

## 2021-04-12 NOTE — Patient Instructions (Signed)
Fill in schedule during the evening for next day ?

## 2021-04-12 NOTE — Therapy (Signed)
[place] ?[facility] ?[place], [place], [address] ?Phone: [phone]   Fax:  [phone] ? ?Speech Language Pathology Treatment ? ?Patient Details  ?Name: Bridget Beck ?MRN: [id number] ?Date of Birth: February 07, 1970 ?Referring Provider (SLP): Anson Crofts Weeks ? ? ?Encounter Date: 04/12/2021 ? ? End of Session - 04/12/21 1934   ? ? Visit Number 4   ? Number of Visits 25   ? Date for SLP Re-Evaluation 06/18/21   ? Authorization Type [employer]   ? Authorization Time Period 03/26/2021 thru 06/18/2021   ? Authorization - Visit Number 4   ? Progress Note Due on Visit 10   ? SLP Start Time 0800   ? SLP Stop Time  0900   ? SLP Time Calculation (min) 60 min   ? Activity Tolerance Patient tolerated treatment well   ? ?  ?  ? ?  ? ? ?Past Medical History:  ?Diagnosis Date  ? Depression   ? Managed  ? GERD (gastroesophageal reflux disease)   ? Hemorrhoids   ? Migraines   ? with aura  ? ? ?Past Surgical History:  ?Procedure Laterality Date  ? CERVICAL FUSION    ? x 2  ? INCONTINENCE SURGERY  2004  ? TUBAL LIGATION  2002  ? ? ?There were no vitals filed for this visit. ? ? Subjective Assessment - 04/12/21 1927   ? ? Subjective "I found something that I need your help with"   ? Patient is accompained by: Family member   ? Currently in Pain? No/denies   ? ?  ?  ? ?  ? ? ? ? ? ? ? ? ADULT SLP TREATMENT - 04/12/21 0001   ? ?  ? Cognitive-Linquistic Treatment  ? Treatment focused on Cognition;Patient/family/caregiver education   ? Skilled Treatment Skilled treatment session focused on pt's cognitive deficits, specifically pt's higher level complex probleming solving abiilities. SLP facilitated session by providing assistance for filling out [employer] paperwork. Pt also voices lack of interest in daily tasks. SLP further facilitated session by introducing schedule to help pt feel as sense of accomplishment.   ? ?  ?  ? ?  ? ? ? SLP Education - 04/12/21 1933    ? ? Education Details recovery process and emotions felt therein   ? Person(s) Educated Patient;Spouse   ? Methods Explanation;Demonstration;Verbal cues;Handout   ? Comprehension Verbalized understanding;Need further instruction;Verbal cues required   ? ?  ?  ? ?  ? ? ? SLP Short Term Goals - 03/26/21 1330   ? ?  ? SLP SHORT TERM GOAL #1  ? Title Patient will recall information about daily routine/schedule using his phone calendar with min cues.   ? Baseline new goal   ? Time 10   ? Period --   sessions  ? Status New   ?  ? SLP SHORT TERM GOAL #2  ? Title Patient/spouse will report daily completion of cognitive home activities 5/7 days.   ? Baseline new goal   ? Time 10   ? Period --   sessions  ? Status New   ? ?  ?  ? ?  ? ? ? SLP Long Term Goals - 03/26/21 1329   ? ?  ? SLP LONG TERM GOAL #1  ? Title Patient will demonstrate iADL tasks requiring multiple cognitive domains   ? Baseline new goal   ? Time 12   ? Period Weeks   ?  Status New   ? Target Date 06/18/21   ?  ? SLP LONG TERM GOAL #2  ? Title Patient will demonstrate mod-complex executive function tasks for home/community environments with 90% acc IND   ? Baseline new goal   ? Time 12   ? Period Weeks   ? Status New   ? Target Date 06/18/21   ? ?  ?  ? ?  ? ? ? Plan - 04/12/21 1934   ? ? Clinical Impression Statement Pt presents with mild higher level cognitive impairment and expresses sense of depression. Prognosis for improvement is considered very good, emotional support provided. Skilled ST intervention continues to be recommended to to improve cognitive abilities and promote functional independence for return to work, community and leisure activities.   ? Speech Therapy Frequency 2x / week   ? Duration 12 weeks   ? Treatment/Interventions SLP instruction and feedback;Functional tasks;Internal/external aids;Patient/family education   ? Potential to Achieve Goals Good   ? SLP Home Exercise Plan provided, see pt instructions section   ? Consulted and  Agree with Plan of Care Patient;Family member/caregiver   ? Family Member Consulted pt's husband   ? ?  ?  ? ?  ? ? ?Patient will benefit from skilled therapeutic intervention in order to improve the following deficits and impairments:   ?Cognitive communication deficit ? ?Acute ischemic left PCA stroke ([place]) ? ? ? ?Problem List ?Patient Active Problem List  ? Diagnosis Date Noted  ? Encounter for removal and reinsertion of intrauterine contraceptive device 01/23/2021  ? Pain medication agreement signed 09/10/2018  ? Chronic, continuous use of opioids 09/02/2018  ? Colon cancer screening 05/08/2016  ? Rectal bleeding 05/08/2016  ? Achilles tendonitis 04/28/2016  ? Bowel movement symptom 12/07/2014  ? Intractable chronic migraine without aura and without status migrainosus 12/05/2014  ? Weight gain 12/01/2013  ? Left hip pain 12/01/2013  ? Migraine without aura 04/20/2013  ? Depression 04/26/2012  ? Lumbar back pain with radiculopathy affecting left lower extremity 02/09/2012  ? Memory changes 10/17/2011  ? ?Monzerrath Mcburney B. Rutherford Nail, M.S., CCC-SLP, CBIS ?Speech-Language Pathologist ?Rehabilitation Services ?Office [phone] ? ?[place], [place] ?04/12/2021, 7:38 PM ? ?[place] [facility] ?[place], [place], [address] ?Phone: [phone]   Fax:  [phone] ? ? ?Name: Vasti Yagi Brys ?MRN: [id number] ?Date of Birth: 05-Jun-1970 ? ?

## 2021-04-16 ENCOUNTER — Encounter: Payer: Self-pay | Admitting: Occupational Therapy

## 2021-04-16 ENCOUNTER — Ambulatory Visit: Payer: Commercial Managed Care - PPO | Admitting: Speech Pathology

## 2021-04-19 ENCOUNTER — Other Ambulatory Visit: Payer: Self-pay

## 2021-04-19 ENCOUNTER — Ambulatory Visit: Payer: Commercial Managed Care - PPO | Admitting: Speech Pathology

## 2021-04-19 ENCOUNTER — Ambulatory Visit: Payer: Commercial Managed Care - PPO

## 2021-04-19 DIAGNOSIS — R278 Other lack of coordination: Secondary | ICD-10-CM

## 2021-04-19 DIAGNOSIS — R41841 Cognitive communication deficit: Secondary | ICD-10-CM

## 2021-04-19 DIAGNOSIS — M6281 Muscle weakness (generalized): Secondary | ICD-10-CM

## 2021-04-19 DIAGNOSIS — I63532 Cerebral infarction due to unspecified occlusion or stenosis of left posterior cerebral artery: Secondary | ICD-10-CM

## 2021-04-19 NOTE — Therapy (Signed)
[place] ?[facility] ?[place], [place], [address] ?Phone: [phone]   Fax:  [phone] ? ?Occupational Therapy Treatment ? ?Patient Details  ?Name: Bridget Beck ?MRN: [id number] ?Date of Birth: 1970/11/19 ?Referring Provider (OT): Dr. Sharyn Creamer, PCP ? ? ?Encounter Date: 04/19/2021 ? ? OT End of Session - 04/19/21 1616   ? ? Visit Number 6   ? Number of Visits 24   ? Date for OT Re-Evaluation 06/04/21   ? Authorization Type [employer]   ? OT Start Time 0845   ? OT Stop Time 0930   ? OT Time Calculation (min) 45 min   ? Activity Tolerance Patient tolerated treatment well   ? Behavior During Therapy [facility] for tasks assessed/performed   ? ?  ?  ? ?  ? ? ?Past Medical History:  ?Diagnosis Date  ? Depression   ? Managed  ? GERD (gastroesophageal reflux disease)   ? Hemorrhoids   ? Migraines   ? with aura  ? ? ?Past Surgical History:  ?Procedure Laterality Date  ? CERVICAL FUSION    ? x 2  ? INCONTINENCE SURGERY  2004  ? TUBAL LIGATION  2002  ? ? ?There were no vitals filed for this visit. ? ? Subjective Assessment - 04/18/21 1613   ? ? Subjective  "I've been feeling really restless."   ? Pertinent History Recent L parietal infarct, hx of migraines, ACDF, chronic pain, neuropathy, depression   ? Limitations R hand numbness and weakness   ? Patient Stated Goals Improve RUE strength and coordination   ? Currently in Pain? No/denies   ? Pain Score 0-No pain   ? Pain Onset More than a month ago   ? Pain Onset More than a month ago   ? ?  ?  ? ?  ? ?Occupational Therapy Treatment: ?Neuro re-ed: ?Facilitated exercises to provide proprioceptive feedback for RUE including wall push ups and bearing weight into hands while seated on mat table, pt rocked A/P and laterally R/L x10 each.  Performed above in a series of 3 exercises, including push ups, WB, followed by a lap of functional mobility around the gym to decrease restlessness x3 sets of this sequence  with good tolerance. ? ?Therapeutic Exercise: ?Issued green theraband and instructed pt in RUE strengthening exercises for chest press, shoulder flex, abd, horiz abd, ER, and elbow flex/ext x2 sets 10 reps each with rest between sets.  Pt was provided moderate cueing to look at RUE throughout exercises for visual feedback to increase spatial awareness of RUE.  Tried exercises in front of mirror but pt was more comfortable looking at arm instead of in the mirror.  Intermittent tactile cues needed for set up of theraband in hand when alternating tension between exercises.  ? ?Response to Treatment: ?SLP reported pt highly restless today and was given a depression scale test, with pt scoring high, indicative of feeling depressed.  SLP working to get pt in to PCP to address this.   Pt tolerated all therapeutic activities well this day.  Moderate vc necessary to look at RUE throughout exercises for visual feedback to increase spatial awareness of RUE.  Tried exercises in front of mirror but pt was more comfortable looking at arm instead of in the mirror.  Intermittent tactile cues needed for set up of theraband in hand when alternating tension between exercises.  Pt will continue to benefit from skilled OT for HEP progression, including  therapeutic exercises and activities to increase proprioceptive awareness throughout RUE, increase strength in R shoulder, and maximize efficiency when engaging RUE into daily tasks.  ? ? ? OT Education - 04/19/21 1614   ? ? Education Details Proprioceptive input to RUE   ? Person(s) Educated Patient   ? Methods Explanation;Verbal cues;Demonstration   ? Comprehension Verbalized understanding;Verbal cues required;Need further instruction;Returned demonstration   ? ?  ?  ? ?  ? ? ? OT Short Term Goals - 03/14/21 0836   ? ?  ? OT SHORT TERM GOAL #1  ? Title Pt will be indep to perform HEP for increasing strength and coordination in RUE.   ? Baseline Eval: issued pink theraputty, further  training needed   ? Time 6   ? Period Weeks   ? Status New   ? Target Date 04/23/21   ? ?  ?  ? ?  ? ? ? ? OT Long Term Goals - 04/12/21 1654   ? ?  ? OT LONG TERM GOAL #1  ? Title Pt will increase FOTO score to 65 or better to indicate increased functional performance.   ? Baseline Eval: FOTO 57   ? Time 12   ? Period Weeks   ? Status New   ? Target Date 06/04/21   ?  ? OT LONG TERM GOAL #2  ? Title Pt will be indep to tie shoes.   ? Baseline Eval: unable   ? Time 12   ? Period Weeks   ? Status New   ? Target Date 06/04/21   ?  ? OT LONG TERM GOAL #3  ? Title Pt will be able to hold and operate cell phone with good accuracy and efficiency with R dominant hand.   ? Baseline Eval: using L non-dominant hand   ? Time 12   ? Period Weeks   ? Status New   ? Target Date 06/04/21   ?  ? OT LONG TERM GOAL #4  ? Title Pt will increase R grip strength by 10 or more lbs to improve ability to hold and carry ADL supplies in R dominant hand   ? Baseline Eval: R grip 5 lbs (L 60)   ? Time 12   ? Period Weeks   ? Status New   ? Target Date 06/04/21   ?  ? OT LONG TERM GOAL #5  ? Title Pt will increase dexterity for improved ability to manipulate ADL supplies in R dominant hand as demonstrated by improvement in 9 hole peg test score by 10 or more seconds.   ? Baseline Eval: R 50 sec, L 28 sec   ? Time 12   ? Period Weeks   ? Status New   ? Target Date 06/04/21   ?  ? Long Term Additional Goals  ? Additional Long Term Goals Yes   ?  ? OT LONG TERM GOAL #6  ? Title Pt will be indep to verbalize and carry over compensatory strategies r/t decreased light touch and proprioception throughout RUE to decrease risk of injury or dropping ADL supplies when using R hand.   ? Baseline Eval: pt reports dropping dishes when holding with R hand.   ? Time 8   ? Period Weeks   ? Status New   ? Target Date 06/04/21   ? ?  ?  ? ?  ? ? ? Plan - 04/19/21 1629   ? ? Clinical Impression Statement SLP reported pt  highly restless today and was given a  depression scale test, with pt scoring high, indicative of feeling depressed.  SLP working to get pt in to PCP to address this.  Pt tolerated all therapeutic activities well this day.  Moderate vc necessary to look at RUE throughout exercises for visual feedback to increase spatial awareness of RUE.  Tried exercises in front of mirror but pt was more comfortable looking at arm instead of in the mirror.  Intermittent tactile cues needed for set up of theraband in hand when alternating tension between exercises.  Pt will continue to benefit from skilled OT for HEP progression, including therapeutic exercises and activities to increase proprioceptive awareness throughout RUE, increase strength in R shoulder, and maximize efficiency when engaging RUE into daily tasks.   ? OT Occupational Profile and History Problem Focused Assessment - Including review of records relating to presenting problem   ? Occupational performance deficits (Please refer to evaluation for details): ADL's;IADL's;Work   ? Body Structure / Function / Physical Skills ADL;Coordination;GMC;UE functional use;Sensation;Decreased knowledge of use of DME;IADL;Dexterity;FMC;Strength   ? Cognitive Skills Memory;Understand;Thought   ? Rehab Potential Good   ? Clinical Decision Making Limited treatment options, no task modification necessary   ? Comorbidities Affecting Occupational Performance: May have comorbidities impacting occupational performance   ? Modification or Assistance to Complete Evaluation  No modification of tasks or assist necessary to complete eval   ? OT Frequency 1x / week   ? OT Duration 12 weeks   ? OT Treatment/Interventions Self-care/ADL training;Therapeutic exercise;DME and/or AE instruction;Cognitive remediation/compensation;Neuromuscular education;Therapeutic activities;Patient/family education   ? Consulted and Agree with Plan of Care Patient;Family member/caregiver   ? Family Member Consulted spouse, Abe People   ? ?  ?  ? ?   ? ? ?Patient will benefit from skilled therapeutic intervention in order to improve the following deficits and impairments:   ?Body Structure / Function / Physical Skills: ADL, Coordination, GMC, UE functional use, Sensat

## 2021-04-21 NOTE — Therapy (Signed)
[facility] ?[place], [place], [address] ?Phone: [phone]   Fax:  [phone] ? ?Speech Language Pathology Treatment ? ?Patient Details  ?Name: Bridget Beck ?MRN: [id number] ?Date of Birth: 1970-09-20 ?Referring Provider (SLP): Anson Crofts Weeks ? ? ?Encounter Date: 04/19/2021 ? ? End of Session - 04/21/21 0726   ? ? Visit Number 5   ? Number of Visits 25   ? Date for SLP Re-Evaluation 06/18/21   ? Authorization Type [employer] P[employer]   ? Authorization Time Period 03/26/2021 thru 06/18/2021   ? Authorization - Visit Number 5   ? Progress Note Due on Visit 10   ? SLP Start Time 0800   ? SLP Stop Time  0900   ? SLP Time Calculation (min) 60 min   ? Activity Tolerance Patient tolerated treatment well   ? ?  ?  ? ?  ? ? ?Past Medical History:  ?Diagnosis Date  ? Depression   ? Managed  ? GERD (gastroesophageal reflux disease)   ? Hemorrhoids   ? Migraines   ? with aura  ? ? ?Past Surgical History:  ?Procedure Laterality Date  ? CERVICAL FUSION    ? x 2  ? INCONTINENCE SURGERY  2004  ? TUBAL LIGATION  2002  ? ? ?There were no vitals filed for this visit. ? ? Subjective Assessment - 04/21/21 0724   ? ? Subjective pt very restless and fidgety today   ? Patient is accompained by: Family member   ? Currently in Pain? No/denies   ? ?  ?  ? ?  ? ? ? ? ? ? ? ? ADULT SLP TREATMENT - 04/21/21 0001   ? ?  ? Treatment Provided  ? Treatment provided Cognitive-Linquistic   ?  ? Cognitive-Linquistic Treatment  ? Treatment focused on Cognition;Patient/family/caregiver education   ? Skilled Treatment Skilled treatment session focused on pt's cognitive impairments, specifically recall of information.  ? ?SLP facilitated session by providing the following interventions:     ? ?Use of external memory aids for recall of information: pt required moderate assistance to update her calendar; she reports that she missed an appt to have her taxes done; she  also required moderate assistance to set a reminder in her app - very atypical for pt    ? ?Task organization: Pt had great difficulty with verbal organization today. She frequently rambled and was very restless. When asked why she appeared so different today, pt stated "I hate my life."  ? ?In response to pt's statement, the PHQ-9 was administered. ? ? ?The PHQ-9 is the nine item depression scale of the patient health questionnaire. The PHQ-9 can function as a screening tool, an aid in diagnosis, and as a symptom tracking tool that can help track a patient's overall depression severity as well as track the improvement of specific symptoms with treatment.     Pt's responses are suggestive ofsevere depression.  ? ?See responses below:     ?In the last 2 weeks, pt reports the following frequency:   1.) Little interest or pleasure in doing things: [place]   ?2.) Feeling down, depressed or hopeless: NEARLY EVERY DAY   ?3.) Trouble falling or staying asleep, or sleeping too much: [place]   ?4.) Feeling tired or having little energy: NEARLY EVERY DAY   ?5.) Poor appetite or overeating: NEARLY EVERY DAY  6.) Feeling bad about yourself- or that you are  a failure or have let yourself or your family down: [place]   ?7.) Trouble concentrating on things, such as reading the newspaper or watching television: [place]   ?8.) Moving or speaking so slowly that other people could have noticed. Or the opposite - being so fidgety or restless that you have been moving around a lot more than usual: [place]   ?9.) thoughts that you would be better off dead, or of hurting yourself: NOT AT ALL   ?10.) If you have checked off any problems, how difficult have these problems made it for you to do your work, take care of things at home, or get along with other people?: VERY DIFFICULT   ?TOTAL SCORE: 24     ?DEPRESSION SEVERITY: SEVERE DEPRESSION   ? ?  ?  ? ?  ? ? ? SLP Education - 04/21/21 0725    ? ? Education Details recommend pt set-up appt with PCP for assistance with possible depression   ? Person(s) Educated Patient;Spouse   ? Methods Explanation;Demonstration;Verbal cues   ? Comprehension Need further instruction;Verbal cues required   ? ?  ?  ? ?  ? ? ? SLP Short Term Goals - 03/26/21 1330   ? ?  ? SLP SHORT TERM GOAL #1  ? Title Patient will recall information about daily routine/schedule using his phone calendar with min cues.   ? Baseline new goal   ? Time 10   ? Period --   sessions  ? Status New   ?  ? SLP SHORT TERM GOAL #2  ? Title Patient/spouse will report daily completion of cognitive home activities 5/7 days.   ? Baseline new goal   ? Time 10   ? Period --   sessions  ? Status New   ? ?  ?  ? ?  ? ? ? SLP Long Term Goals - 03/26/21 1329   ? ?  ? SLP LONG TERM GOAL #1  ? Title Patient will demonstrate iADL tasks requiring multiple cognitive domains   ? Baseline new goal   ? Time 12   ? Period Weeks   ? Status New   ? Target Date 06/18/21   ?  ? SLP LONG TERM GOAL #2  ? Title Patient will demonstrate mod-complex executive function tasks for home/community environments with 90% acc IND   ? Baseline new goal   ? Time 12   ? Period Weeks   ? Status New   ? Target Date 06/18/21   ? ?  ?  ? ?  ? ? ? Plan - 04/21/21 0727   ? ? Clinical Impression Statement Pt presents with mild higher level cognitive impairment and expresses sense of depression. Prognosis for improvement is considered very good, emotional support provided. Skilled ST intervention continues to be recommended to to improve cognitive abilities and promote functional independence for return to work, community and leisure activities.   ? Speech Therapy Frequency 2x / week   ? Duration 12 weeks   ? Treatment/Interventions SLP instruction and feedback;Functional tasks;Internal/external aids;Patient/family education   ? Potential to Achieve Goals Good   ? Potential Considerations Ability to learn/carryover information;Previous level of  function   depression  ? SLP Home Exercise Plan provided, see pt instructions section   ? ?  ?  ? ?  ? ? ?Patient will benefit from skilled therapeutic intervention in order to improve the following deficits and impairments:   ?Cognitive communication deficit ? ?  Acute ischemic left PCA stroke ([place]) ? ? ? ?Problem List ?Patient Active Problem List  ? Diagnosis Date Noted  ? Encounter for removal and reinsertion of intrauterine contraceptive device 01/23/2021  ? Pain medication agreement signed 09/10/2018  ? Chronic, continuous use of opioids 09/02/2018  ? Colon cancer screening 05/08/2016  ? Rectal bleeding 05/08/2016  ? Achilles tendonitis 04/28/2016  ? Bowel movement symptom 12/07/2014  ? Intractable chronic migraine without aura and without status migrainosus 12/05/2014  ? Weight gain 12/01/2013  ? Left hip pain 12/01/2013  ? Migraine without aura 04/20/2013  ? Depression 04/26/2012  ? Lumbar back pain with radiculopathy affecting left lower extremity 02/09/2012  ? Memory changes 10/17/2011  ? ?Meliton Samad B. Rutherford Nail, M.S., CCC-SLP, CBIS ?Speech-Language Pathologist ?Rehabilitation Services ?Office [phone] ? ?[place], [place] ?04/21/2021, 7:28 AM ? ?[place] ?[place], [place], [address] ?Phone: [phone]   Fax:  [phone] ? ? ?Name: Bridget Beck ?MRN: [id number] ?Date of Birth: 1970/03/25 ? ?

## 2021-04-23 ENCOUNTER — Encounter: Payer: Self-pay | Admitting: Occupational Therapy

## 2021-04-23 ENCOUNTER — Other Ambulatory Visit: Payer: Self-pay

## 2021-04-23 ENCOUNTER — Ambulatory Visit: Payer: Commercial Managed Care - PPO | Admitting: Speech Pathology

## 2021-04-23 DIAGNOSIS — I63532 Cerebral infarction due to unspecified occlusion or stenosis of left posterior cerebral artery: Secondary | ICD-10-CM

## 2021-04-23 DIAGNOSIS — R41841 Cognitive communication deficit: Secondary | ICD-10-CM

## 2021-04-24 NOTE — Therapy (Signed)
[facility] ?[facility] ?[place], [place], [address] ?Phone: [phone]   Fax:  [phone] ? ?Speech Language Pathology Treatment ? ?Patient Details  ?Name: Bridget Beck ?MRN: [id number] ?Date of Birth: [birth year] ?Referring Provider (SLP): Anson Crofts Weeks ? ? ?Encounter Date: 04/23/2021 ? ? End of Session - 04/24/21 1428   ? ? Visit Number 6   ? Number of Visits 25   ? Date for SLP Re-Evaluation 06/18/21   ? Authorization Type [employer] PPO   ? Authorization Time Period 03/26/2021 thru 06/18/2021   ? Authorization - Visit Number 6   ? Progress Note Due on Visit 10   ? SLP Start Time 0800   ? SLP Stop Time  0900   ? SLP Time Calculation (min) 60 min   ? Activity Tolerance Patient tolerated treatment well   ? ?  ?  ? ?  ? ? ?Past Medical History:  ?Diagnosis Date  ? Depression   ? Managed  ? GERD (gastroesophageal reflux disease)   ? Hemorrhoids   ? Migraines   ? with aura  ? ? ?Past Surgical History:  ?Procedure Laterality Date  ? CERVICAL FUSION    ? x 2  ? INCONTINENCE SURGERY  2004  ? TUBAL LIGATION  2002  ? ? ?There were no vitals filed for this visit. ? ? Subjective Assessment - 04/24/21 0924   ? ? Subjective "I don't understand this" referring to paperwork related to work   ? Currently in Pain? No/denies   ? ?  ?  ? ?  ? ? ? ? ? ? ? ? ADULT SLP TREATMENT - 04/24/21 0001   ? ?  ? Treatment Provided  ? Treatment provided Cognitive-Linquistic   ?  ? Cognitive-Linquistic Treatment  ? Treatment focused on Cognition;Patient/family/caregiver education   ? Skilled Treatment Skilled treatment session focused on pt's cognitive deficits. Pt brought in paperwork for SLP to assist with. However, pt had not read the paperwork which denied her coverage. Pt also upset with her husband and expressed desire to return to work in the coming weeks. SLP facilitated session by asking questions to obtain a description of her job duties. SLP further  facilitated session by making a list of pt's concerns with returning to work. Will target in future sessions.   ? ?  ?  ? ?  ? ? ? SLP Education - 04/24/21 1426   ? ? Education Details POC to target job duties   ? Person(s) Educated Patient   ? Methods Explanation;Demonstration;Verbal cues   ? Comprehension Need further instruction   ? ?  ?  ? ?  ? ? ? SLP Short Term Goals - 03/26/21 1330   ? ?  ? SLP SHORT TERM GOAL #1  ? Title Patient will recall information about daily routine/schedule using his phone calendar with min cues.   ? Baseline new goal   ? Time 10   ? Period --   sessions  ? Status New   ?  ? SLP SHORT TERM GOAL #2  ? Title Patient/spouse will report daily completion of cognitive home activities 5/7 days.   ? Baseline new goal   ? Time 10   ? Period --   sessions  ? Status New   ? ?  ?  ? ?  ? ? ? SLP Long Term Goals - 03/26/21 1329   ? ?  ? SLP LONG TERM GOAL #1  ?  Title Patient will demonstrate iADL tasks requiring multiple cognitive domains   ? Baseline new goal   ? Time 12   ? Period Weeks   ? Status New   ? Target Date 06/18/21   ?  ? SLP LONG TERM GOAL #2  ? Title Patient will demonstrate mod-complex executive function tasks for home/community environments with 90% acc IND   ? Baseline new goal   ? Time 12   ? Period Weeks   ? Status New   ? Target Date 06/18/21   ? ?  ?  ? ?  ? ? ? Plan - 04/24/21 1429   ? ? Clinical Impression Statement Pt presents with mild higher level cognitive impairment and expresses sense of depression. Prognosis for improvement is considered very good, emotional support provided. Skilled ST intervention continues to be recommended to to improve cognitive abilities and promote functional independence for return to work, community and leisure activities.   ? Speech Therapy Frequency 2x / week   ? Duration 12 weeks   ? Treatment/Interventions SLP instruction and feedback;Functional tasks;Internal/external aids;Patient/family education   ? Potential to Achieve Goals Good   ?  Potential Considerations Ability to learn/carryover information;Previous level of function   ? SLP Home Exercise Plan provided, see pt instructions section   ? Consulted and Agree with Plan of Care Patient;Family member/caregiver   ? ?  ?  ? ?  ? ? ?Patient will benefit from skilled therapeutic intervention in order to improve the following deficits and impairments:   ?Cognitive communication deficit ? ?Acute ischemic left PCA stroke ([place]) ? ? ? ?Problem List ?Patient Active Problem List  ? Diagnosis Date Noted  ? Encounter for removal and reinsertion of intrauterine contraceptive device 01/23/2021  ? Pain medication agreement signed 09/10/2018  ? Chronic, continuous use of opioids 09/02/2018  ? Colon cancer screening 05/08/2016  ? Rectal bleeding 05/08/2016  ? Achilles tendonitis 04/28/2016  ? Bowel movement symptom 12/07/2014  ? Intractable chronic migraine without aura and without status migrainosus 12/05/2014  ? Weight gain 12/01/2013  ? Left hip pain 12/01/2013  ? Migraine without aura 04/20/2013  ? Depression 04/26/2012  ? Lumbar back pain with radiculopathy affecting left lower extremity 02/09/2012  ? Memory changes 10/17/2011  ? ?Gerardo Caiazzo B. Rutherford Nail, M.S., CCC-SLP, CBIS ?Speech-Language Pathologist ?Rehabilitation Services ?Office [phone] ? ?[place], [place] ?04/24/2021, 2:37 PM ? ?[facility] ?[place], [place], [address] ?Phone: [phone]   Fax:  [phone] ? ? ?Name: Bridget Beck ?MRN: [id number] ?Date of Birth: October 06, 1970 ? ?

## 2021-04-24 NOTE — Patient Instructions (Signed)
Practice rolling out play doh, putting lids on containers  ?

## 2021-04-25 ENCOUNTER — Ambulatory Visit: Payer: Commercial Managed Care - PPO | Admitting: Podiatry

## 2021-04-25 ENCOUNTER — Ambulatory Visit: Payer: Commercial Managed Care - PPO

## 2021-04-25 ENCOUNTER — Ambulatory Visit: Payer: Commercial Managed Care - PPO | Admitting: Speech Pathology

## 2021-04-25 DIAGNOSIS — R41841 Cognitive communication deficit: Secondary | ICD-10-CM

## 2021-04-25 DIAGNOSIS — R278 Other lack of coordination: Secondary | ICD-10-CM

## 2021-04-25 DIAGNOSIS — I63532 Cerebral infarction due to unspecified occlusion or stenosis of left posterior cerebral artery: Secondary | ICD-10-CM

## 2021-04-25 DIAGNOSIS — M6281 Muscle weakness (generalized): Secondary | ICD-10-CM

## 2021-04-26 ENCOUNTER — Encounter: Payer: Commercial Managed Care - PPO | Admitting: Speech Pathology

## 2021-04-26 NOTE — Therapy (Signed)
[facility] ?[facility] ?[place], [place], [address] ?Phone: [phone]   Fax:  [phone] ? ?Occupational Therapy Treatment ? ?Patient Details  ?Name: Bridget Beck ?MRN: [id number] ?Date of Birth: Jun 14, 1970 ?Referring Provider (OT): Dr. Sharyn Creamer, PCP ? ? ?Encounter Date: 04/25/2021 ? ? OT End of Session - 04/26/21 0811   ? ? Visit Number 7   ? Number of Visits 24   ? Date for OT Re-Evaluation 06/04/21   ? Authorization Type [employer]   ? OT Start Time 1540   ? OT Stop Time 1625   ? OT Time Calculation (min) 45 min   ? Activity Tolerance Patient tolerated treatment well   ? Behavior During Therapy [facility] for tasks assessed/performed   ? ?  ?  ? ?  ? ? ?Past Medical History:  ?Diagnosis Date  ? Depression   ? Managed  ? GERD (gastroesophageal reflux disease)   ? Hemorrhoids   ? Migraines   ? with aura  ? ? ?Past Surgical History:  ?Procedure Laterality Date  ? CERVICAL FUSION    ? x 2  ? INCONTINENCE SURGERY  2004  ? TUBAL LIGATION  2002  ? ? ?There were no vitals filed for this visit. ? ? Subjective Assessment - 04/25/21 0809   ? ? Subjective  Pt presenting with and verbalizing low motivation today for therapy, but cooperative with encouragement.   ? Pertinent History Recent L parietal infarct, hx of migraines, ACDF, chronic pain, neuropathy, depression   ? Limitations R hand numbness and weakness   ? Patient Stated Goals Improve RUE strength and coordination   ? Currently in Pain? Yes   ? Pain Score 4    ? Pain Location Back   ? Pain Orientation Lower   ? Pain Descriptors / Indicators Aching;Tightness   ? Pain Type Chronic pain   ? Pain Onset More than a month ago   ? Pain Frequency Constant   ? Aggravating Factors  prolonged standing   ? Pain Relieving Factors sitting/rest/heat/pain meds   ? Effect of Pain on Daily Activities decreased standing tolerance   ? Multiple Pain Sites No   ? Pain Onset More than a month ago   ? ?  ?  ? ?   ? ?Occupational Therapy Treatment: ?Therapeutic Exercise: ?Reviewed WB exercises to RUE for proprioceptive input throughout RUE.  Pt reports good carry over at home.  Facilitated hand strengthening with use of hand gripper set at 17.9# to remove jumbo pegs from pegboard x3 trials using R hand, rest between sets.  ? ?Therapeutic Activity: ?Provided stereognosis activities for R hand, with pt able to identify 8 of 10 common items in R hand with vision occluded, extra time.  The 2 items pt was unable to identify, pt was still able to identify the texture and shape of the items, but not the actual object.  Practiced with nuts and bolts with vision occluded; pt successful with the 1/2" nuts/bolts, had difficulty with the 1/8" nuts/bolts.  ? ?Response to Treatment: ?See Plan/clinical impression below. ? ? ? ? ? ? OT Education - 04/25/21 0811   ? ? Education Details Reviewed proprioceptive input activities to RUE   ? Person(s) Educated Patient   ? Methods Explanation;Verbal cues;Demonstration   ? Comprehension Verbalized understanding;Verbal cues required;Returned demonstration   ? ?  ?  ? ?  ? ? ? OT Short Term Goals - 03/14/21 0836   ? ?  ?  OT SHORT TERM GOAL #1  ? Title Pt will be indep to perform HEP for increasing strength and coordination in RUE.   ? Baseline Eval: issued pink theraputty, further training needed   ? Time 6   ? Period Weeks   ? Status New   ? Target Date 04/23/21   ? ?  ?  ? ?  ? ? ? ? OT Long Term Goals - 04/12/21 1654   ? ?  ? OT LONG TERM GOAL #1  ? Title Pt will increase FOTO score to 65 or better to indicate increased functional performance.   ? Baseline Eval: FOTO 57   ? Time 12   ? Period Weeks   ? Status New   ? Target Date 06/04/21   ?  ? OT LONG TERM GOAL #2  ? Title Pt will be indep to tie shoes.   ? Baseline Eval: unable   ? Time 12   ? Period Weeks   ? Status New   ? Target Date 06/04/21   ?  ? OT LONG TERM GOAL #3  ? Title Pt will be able to hold and operate cell phone with good  accuracy and efficiency with R dominant hand.   ? Baseline Eval: using L non-dominant hand   ? Time 12   ? Period Weeks   ? Status New   ? Target Date 06/04/21   ?  ? OT LONG TERM GOAL #4  ? Title Pt will increase R grip strength by 10 or more lbs to improve ability to hold and carry ADL supplies in R dominant hand   ? Baseline Eval: R grip 5 lbs (L 60)   ? Time 12   ? Period Weeks   ? Status New   ? Target Date 06/04/21   ?  ? OT LONG TERM GOAL #5  ? Title Pt will increase dexterity for improved ability to manipulate ADL supplies in R dominant hand as demonstrated by improvement in 9 hole peg test score by 10 or more seconds.   ? Baseline Eval: R 50 sec, L 28 sec   ? Time 12   ? Period Weeks   ? Status New   ? Target Date 06/04/21   ?  ? Long Term Additional Goals  ? Additional Long Term Goals Yes   ?  ? OT LONG TERM GOAL #6  ? Title Pt will be indep to verbalize and carry over compensatory strategies r/t decreased light touch and proprioception throughout RUE to decrease risk of injury or dropping ADL supplies when using R hand.   ? Baseline Eval: pt reports dropping dishes when holding with R hand.   ? Time 8   ? Period Weeks   ? Status New   ? Target Date 06/04/21   ? ?  ?  ? ?  ? ? ? ? ? ? ? ? Plan - 04/25/21 0825   ? ? Clinical Impression Statement Pt presenting with and verbalizing low motivation for therapy this day, but cooperative and participatory with encouragement.  Pt reports good carry over with theraband and WB exercises at home for RUE.  Pt continues to report occasional dropping of items in R hand.  OT reinforced making sure to use vision to compensate for decreased light touch and proprioception in [place] to reduce dropping items in R hand.   Pt will continue to benefit from skilled OT for HEP progression, including therapeutic exercises and activities to increase proprioceptive  awareness throughout RUE, increase strength in R shoulder, and maximize efficiency when engaging RUE into daily tasks.   ?  OT Occupational Profile and History Problem Focused Assessment - Including review of records relating to presenting problem   ? Occupational performance deficits (Please refer to evaluation for details): ADL's;IADL's;Work   ? Body Structure / Function / Physical Skills ADL;Coordination;GMC;UE functional use;Sensation;Decreased knowledge of use of DME;IADL;Dexterity;FMC;Strength   ? Cognitive Skills Memory;Understand;Thought   ? Rehab Potential Good   ? Clinical Decision Making Limited treatment options, no task modification necessary   ? Comorbidities Affecting Occupational Performance: May have comorbidities impacting occupational performance   ? Modification or Assistance to Complete Evaluation  No modification of tasks or assist necessary to complete eval   ? OT Frequency 1x / week   ? OT Duration 12 weeks   ? OT Treatment/Interventions Self-care/ADL training;Therapeutic exercise;DME and/or AE instruction;Cognitive remediation/compensation;Neuromuscular education;Therapeutic activities;Patient/family education   ? Consulted and Agree with Plan of Care Patient;Family member/caregiver   ? Family Member Consulted spouse, Abe People   ? ?  ?  ? ?  ? ? ?Patient will benefit from skilled therapeutic intervention in order to improve the following deficits and impairments:   ?Body Structure / Function / Physical Skills: ADL, Coordination, GMC, UE functional use, Sensation, Decreased knowledge of use of DME, IADL, Dexterity, FMC, Strength ?Cognitive Skills: Memory, Understand, Thought ?  ? ? ?Visit Diagnosis: ?Muscle weakness (generalized) ? ?Other lack of coordination ? ?Acute ischemic left PCA stroke ([place]) ? ? ? ?Problem List ?Patient Active Problem List  ? Diagnosis Date Noted  ? Encounter for removal and reinsertion of intrauterine contraceptive device 01/23/2021  ? Pain medication agreement signed 09/10/2018  ? Chronic, continuous use of opioids 09/02/2018  ? Colon cancer screening 05/08/2016  ? Rectal bleeding 05/08/2016   ? Achilles tendonitis 04/28/2016  ? Bowel movement symptom 12/07/2014  ? Intractable chronic migraine without aura and without status migrainosus 12/05/2014  ? Weight gain 12/01/2013  ? Left hip pain 12/01/2013  ? M

## 2021-04-26 NOTE — Therapy (Signed)
[place] ?[facility] ?[place], [place], [address] ?Phone: [phone]   Fax:  [phone] ? ?Speech Language Pathology Treatment ? ?Patient Details  ?Name: Bridget Beck ?MRN: [id number] ?Date of Birth: Dec 22, 1970 ?Referring Provider (SLP): Anson Crofts Weeks ? ? ?Encounter Date: 04/25/2021 ? ? End of Session - 04/26/21 2033   ? ? Visit Number 7   ? Number of Visits 25   ? Date for SLP Re-Evaluation 06/18/21   ? Authorization Type [employer]   ? Authorization Time Period 03/26/2021 thru 06/18/2021   ? Authorization - Visit Number 7   ? Progress Note Due on Visit 10   ? SLP Start Time 1400   ? SLP Stop Time  1500   ? SLP Time Calculation (min) 60 min   ? Activity Tolerance Other (comment)   increased frustration  ? ?  ?  ? ?  ? ? ?Past Medical History:  ?Diagnosis Date  ? Depression   ? Managed  ? GERD (gastroesophageal reflux disease)   ? Hemorrhoids   ? Migraines   ? with aura  ? ? ?Past Surgical History:  ?Procedure Laterality Date  ? CERVICAL FUSION    ? x 2  ? INCONTINENCE SURGERY  2004  ? TUBAL LIGATION  2002  ? ? ?There were no vitals filed for this visit. ? ? Subjective Assessment - 04/26/21 2026   ? ? Subjective pt appeared reserved, pt's daugther attended session at the request of SLP to help determine baseline cognitive communication function   ? Patient is accompained by: Family member   ? Currently in Pain? No/denies   ? ?  ?  ? ?  ? ? ? ? ? ? ? ? ADULT SLP TREATMENT - 04/26/21 0001   ? ?  ? Treatment Provided  ? Treatment provided Cognitive-Linquistic   ?  ? Cognitive-Linquistic Treatment  ? Treatment focused on Cognition;Patient/family/caregiver education   ? Skilled Treatment Skilled treatment session focused on gathering information regarding pt's baseline cognitive function vs. post stroke deficits. SLP facilitated session by asking questions regarding skills needed to return to work. See clinical impressions statement  for further information about skill set.   ? ?  ?  ? ?  ? ? ? SLP Education - 04/26/21 2033   ? ? Education Details behavioral health counseling vs cognition therapy   ? Person(s) Educated Patient;Child(ren)   ? Methods Explanation;Demonstration;Verbal cues   ? Comprehension Verbalized understanding   ? ?  ?  ? ?  ? ? ? SLP Short Term Goals - 03/26/21 1330   ? ?  ? SLP SHORT TERM GOAL #1  ? Title Patient will recall information about daily routine/schedule using his phone calendar with min cues.   ? Baseline new goal   ? Time 10   ? Period --   sessions  ? Status New   ?  ? SLP SHORT TERM GOAL #2  ? Title Patient/spouse will report daily completion of cognitive home activities 5/7 days.   ? Baseline new goal   ? Time 10   ? Period --   sessions  ? Status New   ? ?  ?  ? ?  ? ? ? SLP Long Term Goals - 03/26/21 1329   ? ?  ? SLP LONG TERM GOAL #1  ? Title Patient will demonstrate iADL tasks requiring multiple cognitive domains   ? Baseline new goal   ? Time  12   ? Period Weeks   ? Status New   ? Target Date 06/18/21   ?  ? SLP LONG TERM GOAL #2  ? Title Patient will demonstrate mod-complex executive function tasks for home/community environments with 90% acc IND   ? Baseline new goal   ? Time 12   ? Period Weeks   ? Status New   ? Target Date 06/18/21   ? ?  ?  ? ?  ? ? ? Plan - 04/26/21 2034   ? ? Clinical Impression Statement Over the previous sessions, ti has been difficult to establish cognitive communication baseline vs deficits related to site of lesion (CVA). Pt was agreeable in having her daughter attend session to help diferentiate baseline deficits as her reported deficits are not commensurate with location of current stroke. In discussing possible relunctance to return to work, pt's daughter provides that since daughter was in the "6th grade, she (pt) lost overall motivation after being fired at [employer], she has had several jobs, she is excited about each but then it fizzes out. Now with the stroke, this job  has fizzled out." Pt also provides that she didn't read insurance paperwork (previous session) because "at the end, I know that I will screw it up. All the way back in high school, my life has been a screw up. I'm not fixable." At this time, pt's concerns for returning to work are not related to cognitive communicaiton abilities but appear to be more related to current mental concerns. Further pt's cognitive communication abilities have been confirmed as baseline by pt and her daughter. Recommend 1 additional session to answer any questions pt and her family might have. In addition, recommend pt seek referral to behavioral health counseling. Despite emotional support and encouargement by this Probation officer and her daughter, pt demonstrated poor tolerance of the above concepts.   ? Speech Therapy Frequency --   1 additional session  ? Treatment/Interventions SLP instruction and feedback;Functional tasks;Internal/external aids;Patient/family education   ? Potential to Achieve Goals Good   ? Potential Considerations Ability to learn/carryover information;Previous level of function;Co-morbidities   ? Consulted and Agree with Plan of Care Patient;Family member/caregiver   ? Family Member Consulted pt's daughter   ? ?  ?  ? ?  ? ? ?Patient will benefit from skilled therapeutic intervention in order to improve the following deficits and impairments:   ?Cognitive communication deficit ? ?Acute ischemic left PCA stroke ([place]) ? ? ? ?Problem List ?Patient Active Problem List  ? Diagnosis Date Noted  ? Encounter for removal and reinsertion of intrauterine contraceptive device 01/23/2021  ? Pain medication agreement signed 09/10/2018  ? Chronic, continuous use of opioids 09/02/2018  ? Colon cancer screening 05/08/2016  ? Rectal bleeding 05/08/2016  ? Achilles tendonitis 04/28/2016  ? Bowel movement symptom 12/07/2014  ? Intractable chronic migraine without aura and without status migrainosus 12/05/2014  ? Weight gain 12/01/2013  ? Left  hip pain 12/01/2013  ? Migraine without aura 04/20/2013  ? Depression 04/26/2012  ? Lumbar back pain with radiculopathy affecting left lower extremity 02/09/2012  ? Memory changes 10/17/2011  ? ?Rainey Rodger B. Rutherford Nail, M.S., CCC-SLP, CBIS ?Speech-Language Pathologist ?Rehabilitation Services ?Office [phone] ? ?[place], [place] ?04/26/2021, 8:51 PM ? ?[place] ?[facility] ?[place], [place], [address] ?Phone: [phone]   Fax:  [phone] ? ? ?Name: Bridget Beck ?MRN: [id number] ?Date of Birth: [birth year] ? ?

## 2021-04-30 ENCOUNTER — Ambulatory Visit: Payer: Commercial Managed Care - PPO | Attending: Family Medicine | Admitting: Speech Pathology

## 2021-04-30 DIAGNOSIS — R41841 Cognitive communication deficit: Secondary | ICD-10-CM | POA: Insufficient documentation

## 2021-04-30 DIAGNOSIS — I63532 Cerebral infarction due to unspecified occlusion or stenosis of left posterior cerebral artery: Secondary | ICD-10-CM | POA: Insufficient documentation

## 2021-04-30 DIAGNOSIS — R278 Other lack of coordination: Secondary | ICD-10-CM | POA: Insufficient documentation

## 2021-04-30 DIAGNOSIS — M6281 Muscle weakness (generalized): Secondary | ICD-10-CM | POA: Diagnosis present

## 2021-04-30 NOTE — Therapy (Signed)
[facility] ?[facility] ?[place], [place], [address] ?Phone: [phone]   Fax:  [phone] ? ?Speech Language Pathology Treatment ? ?DISCHARGE SUMMARY ? ?Patient Details  ?Name: Bridget Beck ?MRN: [id number] ?Date of Birth: May 09, 1970 ?Referring Provider (SLP): Anson Crofts Weeks ? ? ?Encounter Date: 04/30/2021 ? ? End of Session - 04/30/21 1223   ? ? Visit Number 8   ? Number of Visits 25   ? Date for SLP Re-Evaluation 06/18/21   ? Authorization Type [employer] PPO   ? Authorization Time Period 03/26/2021 thru 06/18/2021   ? Authorization - Visit Number 8   ? Progress Note Due on Visit 10   ? SLP Start Time [phone]   ? SLP Stop Time  0900   ? SLP Time Calculation (min) 50 min   ? Activity Tolerance Patient tolerated treatment well   ? ?  ?  ? ?  ? ? ?Past Medical History:  ?Diagnosis Date  ? Depression   ? Managed  ? GERD (gastroesophageal reflux disease)   ? Hemorrhoids   ? Migraines   ? with aura  ? ? ?Past Surgical History:  ?Procedure Laterality Date  ? CERVICAL FUSION    ? x 2  ? INCONTINENCE SURGERY  2004  ? TUBAL LIGATION  2002  ? ? ?There were no vitals filed for this visit. ? ? Subjective Assessment - 04/30/21 1210   ? ? Subjective "I made that stupid play doh"   ? Currently in Pain? No/denies   ? ?  ?  ? ?  ? ? ? ? ? ? ? ? ADULT SLP TREATMENT - 04/30/21 0001   ? ?  ? Treatment Provided  ? Treatment provided Cognitive-Linquistic   ?  ? Cognitive-Linquistic Treatment  ? Treatment focused on Cognition;Patient/family/caregiver education   ? Skilled Treatment Skilled treatment session focused on completely education. SLP facilitated session by giving pt all necessary paperwork for [facility] as well as written instructions to call her neurologist for signature (number provided). In reviewing pt's chart (specifically notes from [facility]) pt was not able to recall many of the recommendations that [place] made. I wrote down that pt would  benefit from following up with Ailene Ravel and having her daughter attend any and all appts with pt to help pt remember information as well as remember what information to convey to appropriate medical professional. I also provided pt with resources for professional counseling.   ? ?  ?  ? ?  ? ? ? SLP Education - 04/30/21 1222   ? ? Education Details behavioral health counseling vs cognition therapy; memory strategy of taking someone with her to various medical appts   ? Person(s) Educated Patient   ? Methods Explanation;Demonstration;Verbal cues;Handout   ? Comprehension Verbalized understanding   ? ?  ?  ? ?  ? ? ? SLP Short Term Goals - 04/30/21 1224   ? ?  ? SLP SHORT TERM GOAL #1  ? Title Patient will recall information about daily routine/schedule using his phone calendar with min cues.   ? Status Achieved   ?  ? SLP SHORT TERM GOAL #2  ? Title Patient/spouse will report daily completion of cognitive home activities 5/7 days.   ? Status Not Met   impeded by pt's overall lack of interest in activities  ? ?  ?  ? ?  ? ? ? SLP Long Term Goals - 04/30/21 1225   ? ?  ?  SLP LONG TERM GOAL #1  ? Title Patient will demonstrate iADL tasks requiring multiple cognitive domains   ? Status Achieved   pt is able to complete appropriately when her anxiety and depression are managed  ?  ? SLP LONG TERM GOAL #2  ? Title Patient will demonstrate mod-complex executive function tasks for home/community environments with 90% acc IND   ? Status Achieved   ? ?  ?  ? ?  ? ? ? Plan - 04/30/21 1223   ? ? Clinical Impression Statement Pt presented with acute on chronic cognitive communication deficits. These deficits are further exacerbated by pt's likely depression and negative thoughts towards herself. At this time, pt's acute cognitive deficits have resolved and skilled ST intervention is no longer indicated.   ? Consulted and Agree with Plan of Care Patient   ? ?  ?  ? ?  ? ? ?Problem List ?Patient Active Problem List  ? Diagnosis  Date Noted  ? Encounter for removal and reinsertion of intrauterine contraceptive device 01/23/2021  ? Pain medication agreement signed 09/10/2018  ? Chronic, continuous use of opioids 09/02/2018  ? Colon cancer screening 05/08/2016  ? Rectal bleeding 05/08/2016  ? Achilles tendonitis 04/28/2016  ? Bowel movement symptom 12/07/2014  ? Intractable chronic migraine without aura and without status migrainosus 12/05/2014  ? Weight gain 12/01/2013  ? Left hip pain 12/01/2013  ? Migraine without aura 04/20/2013  ? Depression 04/26/2012  ? Lumbar back pain with radiculopathy affecting left lower extremity 02/09/2012  ? Memory changes 10/17/2011  ? ?Lorrine Killilea B. Rutherford Nail, M.S., CCC-SLP, CBIS ?Speech-Language Pathologist ?Rehabilitation Services ?Office [phone] ? ?[place], [place] ?04/30/2021, 12:27 PM ? ?[place] ?[facility] ?[place], [place], [address] ?Phone: [phone]   Fax:  [phone] ? ? ?Name: Bridget Beck ?MRN: [id number] ?Date of Birth: 1970-10-21 ? ?

## 2021-05-03 ENCOUNTER — Encounter: Payer: Commercial Managed Care - PPO | Admitting: Speech Pathology

## 2021-05-03 ENCOUNTER — Ambulatory Visit: Payer: Commercial Managed Care - PPO

## 2021-05-07 ENCOUNTER — Encounter: Payer: Commercial Managed Care - PPO | Admitting: Speech Pathology

## 2021-05-10 ENCOUNTER — Encounter: Payer: Commercial Managed Care - PPO | Admitting: Speech Pathology

## 2021-05-10 ENCOUNTER — Ambulatory Visit: Payer: Commercial Managed Care - PPO

## 2021-05-10 DIAGNOSIS — R41841 Cognitive communication deficit: Secondary | ICD-10-CM | POA: Diagnosis not present

## 2021-05-10 DIAGNOSIS — R278 Other lack of coordination: Secondary | ICD-10-CM

## 2021-05-10 DIAGNOSIS — M6281 Muscle weakness (generalized): Secondary | ICD-10-CM

## 2021-05-11 NOTE — Therapy (Signed)
?[facility] ?[place], [place], [address] ?Phone: [phone]   Fax:  [phone] ? ?Occupational Therapy Treatment ? ?Patient Details  ?Name: Bridget Beck ?MRN: [id number] ?Date of Birth: [birth year] ?Referring Provider (OT): Nathanial Millman, [place] (PCP) ? ? ?Encounter Date: 05/10/2021 ? ? OT End of Session - 05/11/21 1252   ? ? Visit Number 8   ? Number of Visits 24   ? Date for OT Re-Evaluation 06/04/21   ? Authorization Type [employer]   ? OT Start Time [phone]   ? OT Stop Time 0109   ? OT Time Calculation (min) 40 min   ? Activity Tolerance Patient tolerated treatment well   ? Behavior During Therapy [facility] for tasks assessed/performed   ? ?  ?  ? ?  ? ? ?Past Medical History:  ?Diagnosis Date  ? Depression   ? Managed  ? GERD (gastroesophageal reflux disease)   ? Hemorrhoids   ? Migraines   ? with aura  ? ? ?Past Surgical History:  ?Procedure Laterality Date  ? CERVICAL FUSION    ? x 2  ? INCONTINENCE SURGERY  2004  ? TUBAL LIGATION  2002  ? ? ?There were no vitals filed for this visit. ? ? Subjective Assessment - 05/10/21 0907   ? ? Subjective  Pt in agreement with readiness for discharge this date.   ? Pertinent History Recent L parietal infarct, hx of migraines, ACDF, chronic pain, neuropathy, depression   ? Limitations R hand numbness and weakness   ? Patient Stated Goals Improve RUE strength and coordination   ? Currently in Pain? No/denies   ? Pain Score 0-No pain   ? Pain Onset More than a month ago   ? Pain Onset More than a month ago   ? ?  ?  ? ?  ?Occupational Therapy Discharge: ?Pt seen for 8 OT sessions following CVA on 03/04/21.  Pt continues to present with mild sensory deficits, including slightly diminished light touch and proprioception in distal RUE, though pt is indep with compensatory strategies related to these deficits when performing daily tasks.  Pt demonstrates good RUE strength and [place] skills; see note for  details, and pt is indep with HEP.  Pt is anticipating return to work part time within the next week to 1 month.  Pt has met all OT goals and has returned to indep with basic and instrumental ADLs.  No additional skilled OT indicated at this time.   ? ?Therapeutic Exercise: ?Objective measures taken and goals updated.  OT reviewed HEP and pt verbalizes indep and good carryover.   ? ? ? OT Education - 05/10/21 1250   ? ? Education Details Discharge recommendations/HEP review   ? Person(s) Educated Patient   ? Methods Explanation   ? Comprehension Verbalized understanding   ? ?  ?  ? ?  ? ? ? OT Short Term Goals - 05/10/21 0915   ? ?  ? OT SHORT TERM GOAL #1  ? Title Pt will be indep to perform HEP for increasing strength and coordination in RUE.   ? Baseline Eval: issued pink theraputty, further training needed; 05/10/21: indep with theraband and putty   ? Time 6   ? Period Weeks   ? Status Achieved   ? Target Date 04/23/21   ? ?  ?  ? ?  ? ? ? ? OT Long Term Goals - 05/10/21 0925   ? ?  ?  OT LONG TERM GOAL #1  ? Title Pt will increase FOTO score to 65 or better to indicate increased functional performance.   ? Baseline Eval: FOTO 57; 05/10/21: 71   ? Time 12   ? Period Weeks   ? Status Achieved   ? Target Date 06/04/21   ?  ? OT LONG TERM GOAL #2  ? Title Pt will be indep to tie shoes.   ? Baseline Eval: unable; 05/10/21: indep   ? Time 12   ? Period Weeks   ? Status Achieved   ? Target Date 06/04/21   ?  ? OT LONG TERM GOAL #3  ? Title Pt will be able to hold and operate cell phone with good accuracy and efficiency with R dominant hand.   ? Baseline Eval: using L non-dominant hand; 05/10/21: able to with good accuracy   ? Time 12   ? Period Weeks   ? Status Achieved   ? Target Date 06/04/21   ?  ? OT LONG TERM GOAL #4  ? Title Pt will increase R grip strength by 10 or more lbs to improve ability to hold and carry ADL supplies in R dominant hand   ? Baseline Eval: R grip 5 lbs (L 60); 05/10/21: R grip: 41, L grip 60   ?  Time 12   ? Period Weeks   ? Status Achieved   ? Target Date 06/04/21   ?  ? OT LONG TERM GOAL #5  ? Title Pt will increase dexterity for improved ability to manipulate ADL supplies in R dominant hand as demonstrated by improvement in 9 hole peg test score by 10 or more seconds.   ? Baseline Eval: R 50 sec, L 28 sec; 05/10/21: R 23 sec, L 23 sec   ? Time 12   ? Period Weeks   ? Status Achieved   ? Target Date 06/04/21   ?  ? OT LONG TERM GOAL #6  ? Title Pt will be indep to verbalize and carry over compensatory strategies r/t decreased light touch and proprioception throughout RUE to decrease risk of injury or dropping ADL supplies when using R hand.   ? Baseline Eval: pt reports dropping dishes when holding with R hand; 05/10/21: Pt indep with compensatory strategies   ? Time 8   ? Period Weeks   ? Status Achieved   ? Target Date 06/04/21   ? ?  ?  ? ?  ? ? Plan - 05/10/21 1307   ? ? Clinical Impression Statement Pt seen for 8 OT sessions following CVA on 03/04/21.  Pt continues to present with mild sensory deficits, including slightly diminished light touch and proprioception in distal RUE, though pt is indep with compensatory strategies related to these deficits when performing daily tasks.  Pt demonstrates good RUE strength and [place] skills; see note for details, and pt is indep with HEP.  Pt is anticipating return to work part time within the next week to 1 month.  Pt has met all OT goals and has returned to indep with basic and instrumental ADLs.  No additional skilled OT indicated at this time.   ? OT Occupational Profile and History Problem Focused Assessment - Including review of records relating to presenting problem   ? Occupational performance deficits (Please refer to evaluation for details): ADL's;IADL's;Work   ? Body Structure / Function / Physical Skills ADL;Coordination;GMC;UE functional use;Sensation;Decreased knowledge of use of DME;IADL;Dexterity;FMC;Strength   ? Cognitive Skills  Memory;Understand;Thought   ?  Rehab Potential Good   ? Clinical Decision Making Limited treatment options, no task modification necessary   ? Comorbidities Affecting Occupational Performance: May have comorbidities impacting occupational performance   ? Modification or Assistance to Complete Evaluation  No modification of tasks or assist necessary to complete eval   ? OT Frequency 1x / week   ? OT Duration 12 weeks   ? OT Treatment/Interventions Self-care/ADL training;Therapeutic exercise;DME and/or AE instruction;Cognitive remediation/compensation;Neuromuscular education;Therapeutic activities;Patient/family education   ? Consulted and Agree with Plan of Care Patient;Family member/caregiver   ? Family Member Consulted spouse, Abe People   ? ?  ?  ? ?  ? ? ?Patient will benefit from skilled therapeutic intervention in order to improve the following deficits and impairments:   ?Body Structure / Function / Physical Skills: ADL, Coordination, GMC, UE functional use, Sensation, Decreased knowledge of use of DME, IADL, Dexterity, FMC, Strength ?Cognitive Skills: Memory, Understand, Thought ?  ? ? ?Visit Diagnosis: ?Muscle weakness (generalized) ? ?Other lack of coordination ? ? ? ?Problem List ?Patient Active Problem List  ? Diagnosis Date Noted  ? Encounter for removal and reinsertion of intrauterine contraceptive device 01/23/2021  ? Pain medication agreement signed 09/10/2018  ? Chronic, continuous use of opioids 09/02/2018  ? Colon cancer screening 05/08/2016  ? Rectal bleeding 05/08/2016  ? Achilles tendonitis 04/28/2016  ? Bowel movement symptom 12/07/2014  ? Intractable chronic migraine without aura and without status migrainosus 12/05/2014  ? Weight gain 12/01/2013  ? Left hip pain 12/01/2013  ? Migraine without aura 04/20/2013  ? Depression 04/26/2012  ? Lumbar back pain with radiculopathy affecting left lower extremity 02/09/2012  ? Memory changes 10/17/2011  ? ?Leta Speller, MS, OTR/L ? ?Darleene Cleaver,  OT ?05/11/2021, 1:08 PM ? ?[facility] ?[facility] ?[place], [place], [address] ?Phone: [phone]   Fax:  [phone] ? ?Name: Cleotilde Spadaccini Seelman ?MRN: [id number] ?Date of [place]

## 2021-05-14 ENCOUNTER — Encounter: Payer: Commercial Managed Care - PPO | Admitting: Speech Pathology

## 2021-05-17 ENCOUNTER — Ambulatory Visit: Payer: Commercial Managed Care - PPO

## 2021-05-17 ENCOUNTER — Encounter: Payer: Commercial Managed Care - PPO | Admitting: Speech Pathology

## 2021-05-21 ENCOUNTER — Encounter: Payer: Commercial Managed Care - PPO | Admitting: Speech Pathology

## 2021-05-24 ENCOUNTER — Encounter: Payer: Commercial Managed Care - PPO | Admitting: Speech Pathology

## 2021-05-29 ENCOUNTER — Encounter: Payer: Commercial Managed Care - PPO | Admitting: Speech Pathology

## 2021-05-31 ENCOUNTER — Encounter: Payer: Self-pay | Admitting: Occupational Therapy

## 2021-05-31 ENCOUNTER — Encounter: Payer: Commercial Managed Care - PPO | Admitting: Speech Pathology

## 2021-06-05 ENCOUNTER — Encounter: Payer: Commercial Managed Care - PPO | Admitting: Speech Pathology

## 2021-06-07 ENCOUNTER — Encounter: Payer: Commercial Managed Care - PPO | Admitting: Speech Pathology

## 2021-06-07 ENCOUNTER — Encounter: Payer: Self-pay | Admitting: Occupational Therapy

## 2021-06-14 ENCOUNTER — Encounter: Payer: Commercial Managed Care - PPO | Admitting: Speech Pathology

## 2021-06-19 ENCOUNTER — Encounter: Payer: Commercial Managed Care - PPO | Admitting: Speech Pathology

## 2021-06-21 ENCOUNTER — Encounter: Payer: Commercial Managed Care - PPO | Admitting: Speech Pathology

## 2021-06-26 ENCOUNTER — Encounter: Payer: Commercial Managed Care - PPO | Admitting: Speech Pathology

## 2021-06-28 ENCOUNTER — Encounter: Payer: Commercial Managed Care - PPO | Admitting: Speech Pathology

## 2021-07-18 ENCOUNTER — Encounter: Payer: Self-pay | Admitting: Internal Medicine

## 2022-07-08 DIAGNOSIS — G894 Chronic pain syndrome: Secondary | ICD-10-CM | POA: Diagnosis not present

## 2022-07-08 DIAGNOSIS — Z1231 Encounter for screening mammogram for malignant neoplasm of breast: Secondary | ICD-10-CM | POA: Diagnosis not present

## 2022-07-08 DIAGNOSIS — Z79899 Other long term (current) drug therapy: Secondary | ICD-10-CM | POA: Diagnosis not present

## 2022-07-08 DIAGNOSIS — F419 Anxiety disorder, unspecified: Secondary | ICD-10-CM | POA: Diagnosis not present

## 2022-07-18 ENCOUNTER — Encounter: Payer: Self-pay | Admitting: Physician Assistant

## 2022-08-08 DIAGNOSIS — Z1231 Encounter for screening mammogram for malignant neoplasm of breast: Secondary | ICD-10-CM | POA: Diagnosis not present

## 2022-09-09 NOTE — Progress Notes (Unsigned)
09/10/2022 Bridget Beck [id number] 1970/11/06  Referring provider: [facility] Primary [facility] doctor: Dr. Rhea Belton  ASSESSMENT AND PLAN:   Gastroesophageal reflux disease with bloating, intermittent dysphagia Likely dysmotility from GERD due to intermittent symptoms Will get AB [place] with family history and some RUQ pain Will get labs to evaluate for anemia, LFTs, kidney and celiac Will schedule for EGD with possible dilation with Colon to evaluate If negative consider SIBO testing Given FODMAP diet Increase protonix for 40 mg BID Will schedule EGD. I discussed risks of EGD with patient today, including risk of sedation, bleeding or perforation.  Patient provides understanding and gave verbal consent to proceed.  History of adenomatous polyp of colon 2018 colonoscopy with Dr. Rhea Belton with 2 polyps 4 to 5 mm Path SS and TA polyp, internal hemorrhoids suggested 5-year follow-up which would have been 2023. She had a bad prep due to taste.  Will do different prep, will schedule at [facility] with Dr. Rhea Belton We have discussed the risks of bleeding, infection, perforation, medication reactions, and remote risk of death associated with colonoscopy. All questions were answered and the patient acknowledges these risk and wishes to proceed.   Patient Care Team: [facility] as PCP - General Towana Badger, MD (Gynecology)  HISTORY OF PRESENT ILLNESS: 52 y.o. female with a past medical history of depression, IBS, scoliosis, history of incontinence/spine surgery, GERD, hemorrhoids, chronic constipation, CVA 02/2021 she is on bASA and others listed below presents for evaluation of AB pain, GERD, AB bloating.   2018 colonoscopy with Dr. Rhea Belton with 2 polyps 4 to 5 mm Path SS and TA polyp, internal hemorrhoids suggested 5-year follow-up which would have been 2023. She had a bad prep due to taste.  03/23/2018 [facility] office visit for hemorrhoids, constipation.  Husband is here with her and  provides some of the history.  She states her memory is not as good since her stroke last Feb, she is only on bASA.   She will get severe AB bloating after certain foods, can have pain with breathing when this happens.  She states she has the bloating every day, uncertain all of the triggers but can be worse with onions.  Feels may be having chills/hot flashes Has BM every day but soft pudding. Having dark stools but no hematochezia. No iron or pepto.  Denies family history of CRC.  Mother with history of liver disease.Father with history of GB removal.   She denies blood thinner use.  She denies NSAID use.  She reports ETOH use, rare once a month. She denies tobacco use.  She denies drug use.    She  reports that she quit smoking about 9 years ago. Her smoking use included cigarettes. She started smoking about 12 years ago. She has a 0.3 pack-year smoking history. She has never used smokeless tobacco. She reports that she does not drink alcohol and does not use drugs.  Colon 2018: Impression:  - The examined portion of the ileum was normal. - Two 4 to 5 mm polyps in the transverse colon,  removed with a cold snare. Resected and retrieved. - Mild diverticulosis in the sigmoid colon. - Internal hemorrhoids.  RELEVANT LABS AND IMAGING: CBC    Component Value Date/Time   WBC 9.5 02/22/2017 1731   RBC 4.80 02/22/2017 1731   HGB 14.2 02/22/2017 1731   HGB 14.2 08/30/2016 1416   HCT 41.0 02/22/2017 1731   HCT 41.6 08/30/2016 1416   PLT  259 02/22/2017 1731   PLT 312 08/30/2016 1416   MCV 85.3 02/22/2017 1731   MCV 87 08/30/2016 1416   MCH 29.6 02/22/2017 1731   MCHC 34.7 02/22/2017 1731   RDW 13.2 02/22/2017 1731   RDW 14.2 08/30/2016 1416   LYMPHSABS 2.2 08/30/2016 1416   MONOABS 0.5 03/13/2015 1005   EOSABS 0.2 08/30/2016 1416   BASOSABS 0.0 08/30/2016 1416   No results for input(s): "HGB" in the last 8760 hours.  CMP     Component Value Date/Time   NA 136 02/22/2017  1731   NA 141 08/30/2016 1416   K 3.8 02/22/2017 1731   CL 106 02/22/2017 1731   CO2 22 02/22/2017 1731   GLUCOSE 95 02/22/2017 1731   BUN 19 02/22/2017 1731   BUN 14 08/30/2016 1416   CREATININE 0.82 02/22/2017 1731   CREATININE 0.90 04/20/2013 0911   CALCIUM 8.9 02/22/2017 1731   PROT 7.0 02/22/2017 1731   ALBUMIN 4.1 02/22/2017 1731   AST 18 02/22/2017 1731   ALT 16 02/22/2017 1731   ALKPHOS 56 02/22/2017 1731   BILITOT 0.5 02/22/2017 1731   GFRNONAA >60 02/22/2017 1731   GFRNONAA 79 04/20/2013 0911   GFRAA >60 02/22/2017 1731   GFRAA >89 04/20/2013 0911      Latest Ref Rng & Units 02/22/2017    5:31 PM 08/30/2016    2:16 PM 03/13/2015   10:05 AM  Hepatic Function  Total Protein 6.5 - 8.1 g/dL 7.0   7.2   Albumin 3.5 - 5.0 g/dL 4.1   4.3   AST 15 - 41 U/L 18   11   ALT 14 - 54 U/L 16   9   Alk Phosphatase 38 - 126 U/L 56  60  53   Total Bilirubin 0.3 - 1.2 mg/dL 0.5   0.3   Bilirubin, Direct 0.1 - 0.5 mg/dL <9.6         Current Medications:    Current Facility-Administered Medications (Endocrine & Metabolic):    levonorgestrel (MIRENA) 20 MCG/24HR IUD   triamcinolone acetonide (KENALOG-40) injection 20 mg  Current Outpatient Medications (Cardiovascular):    atorvastatin (LIPITOR) 80 MG tablet, Take 80 mg by mouth daily.   Current Outpatient Medications (Respiratory):    albuterol (VENTOLIN HFA) 108 (90 Base) MCG/ACT inhaler, SMARTSIG:2 Puff(s) By Mouth Every 4-6 Hours PRN (Patient not taking: Reported on 09/10/2022)   Current Outpatient Medications (Analgesics):    aspirin EC 81 MG tablet, Take 81 mg by mouth daily. Swallow whole.   Erenumab-aooe 70 MG/ML SOAJ, Inject 70 mg into the skin every 28 (twenty-eight) days.   NURTEC 75 MG TBDP, Take 1 tablet by mouth daily as needed.     Current Outpatient Medications (Other):    busPIRone (BUSPAR) 30 MG tablet, Take 30 mg by mouth 2 (two) times daily.   DULoxetine (CYMBALTA) 60 MG capsule, Take 60 mg by mouth 2  (two) times daily.   ondansetron (ZOFRAN) 24 MG tablet, Take 24 mg by mouth daily as needed for nausea.   pantoprazole (PROTONIX) 40 MG tablet, TK 1 T PO BID 30 MINUTES BEFORE MEALS   pregabalin (LYRICA) 200 MG capsule, Take by mouth. Take 2 capsules in the morning and 1 at night   topiramate (TOPAMAX) 50 MG tablet, Take 50 mg by mouth at bedtime.   valACYclovir (VALTREX) 1000 MG tablet, Take 2 tablets (2,000 mg total) by mouth 2 (two) times daily. For 1 day.  Repeat as needed with future episodes. (  Patient taking differently: Take 2,000 mg by mouth as needed. For 1 day.  Repeat as needed with future episodes.)  Current Facility-Administered Medications (Other):    0.9 %  sodium chloride infusion  Medical History:  Past Medical History:  Diagnosis Date   Anxiety    Depression    Managed   GERD (gastroesophageal reflux disease)    Hemorrhoids    Migraines    with aura   Stroke (HCC) 2023   Allergies:  Allergies  Allergen Reactions   Buprenorphine Swelling   Pramipexole Other (See Comments)   Amitriptyline Other (See Comments)    Had severe migraine and pt could not see for 2 hours.   Fluoxetine Hcl Other (See Comments)    Fatigue   Methocarbamol Other (See Comments)    Stomach upset Stomach upset   Mirtazapine Other (See Comments)    "felt like a zombie" "felt like a zombie"   Prozac [Fluoxetine Hcl]     Fatigue      Surgical History:  She  has a past surgical history that includes Cervical fusion; Incontinence surgery (2004); and Tubal ligation (2002). Family History:  Her family history includes Bone cancer in her father; Breast cancer in her maternal aunt; Cancer in her maternal aunt; Diabetes in her mother; Heart disease in her maternal grandmother; Liver disease in her mother; Peripheral vascular disease in her father; Stroke in her sister.  REVIEW OF SYSTEMS  :  Review of Systems  Constitutional:  Positive for malaise/fatigue. Negative for chills, diaphoresis,  fever and weight loss.  Respiratory:  Positive for shortness of breath (with bloating). Negative for cough, hemoptysis, sputum production and wheezing.   Cardiovascular:  Positive for chest pain (with bloating, never with exertion). Negative for leg swelling.  Gastrointestinal:  Positive for abdominal pain (epigastric and RUQ), heartburn (intermittent with pills and solids, upper esophagus) and melena (questionable). Negative for blood in stool, constipation, diarrhea, nausea and vomiting.  Skin:  Negative for itching and rash.     PHYSICAL EXAM: BP 120/64 (BP Location: Left Arm, Patient Position: Sitting, Cuff Size: Large)   Pulse 80   Ht 5\' 3"  (1.6 m) Comment: height measured without shoes  Wt 255 lb (115.7 kg)   BMI 45.17 kg/m  General Appearance: Well nourished, in no apparent distress. Head:   Normocephalic and atraumatic. Eyes:  sclerae anicteric,conjunctive pink  Respiratory: Respiratory effort normal, BS equal bilaterally without rales, rhonchi, wheezing. Cardio: RRR with no MRGs. Peripheral pulses intact.  Abdomen: Soft,  Obese ,active bowel sounds. mild tenderness in the epigastrium and in the RUQ. Without guarding and Without rebound. No masses. Rectal: Not evaluated Musculoskeletal: Full ROM, Normal gait. Without edema. Skin:  Dry and intact without significant lesions or rashes Neuro: Alert and  oriented x4;  No focal deficits. Psych:  Cooperative. Normal mood and affect.    Doree Albee, PA-C 3:17 PM

## 2022-09-10 ENCOUNTER — Encounter: Payer: Self-pay | Admitting: Physician Assistant

## 2022-09-10 ENCOUNTER — Other Ambulatory Visit (INDEPENDENT_AMBULATORY_CARE_PROVIDER_SITE_OTHER): Payer: BC Managed Care – PPO

## 2022-09-10 ENCOUNTER — Ambulatory Visit: Payer: BC Managed Care – PPO | Admitting: Physician Assistant

## 2022-09-10 VITALS — BP 120/64 | HR 80 | Ht 63.0 in | Wt 255.0 lb

## 2022-09-10 DIAGNOSIS — R140 Abdominal distension (gaseous): Secondary | ICD-10-CM

## 2022-09-10 DIAGNOSIS — K219 Gastro-esophageal reflux disease without esophagitis: Secondary | ICD-10-CM | POA: Diagnosis not present

## 2022-09-10 DIAGNOSIS — R1314 Dysphagia, pharyngoesophageal phase: Secondary | ICD-10-CM | POA: Diagnosis not present

## 2022-09-10 DIAGNOSIS — I639 Cerebral infarction, unspecified: Secondary | ICD-10-CM

## 2022-09-10 DIAGNOSIS — R1011 Right upper quadrant pain: Secondary | ICD-10-CM | POA: Diagnosis not present

## 2022-09-10 DIAGNOSIS — Z86010 Personal history of colonic polyps: Secondary | ICD-10-CM | POA: Diagnosis not present

## 2022-09-10 LAB — CBC WITH DIFFERENTIAL/PLATELET
Basophils Absolute: 0 10*3/uL (ref 0.0–0.1)
Basophils Relative: 0.5 % (ref 0.0–3.0)
Eosinophils Absolute: 0.4 10*3/uL (ref 0.0–0.7)
Eosinophils Relative: 4.6 % (ref 0.0–5.0)
HCT: 44.3 % (ref 36.0–46.0)
Hemoglobin: 14.6 g/dL (ref 12.0–15.0)
Lymphocytes Relative: 27.2 % (ref 12.0–46.0)
Lymphs Abs: 2.1 10*3/uL (ref 0.7–4.0)
MCHC: 33 g/dL (ref 30.0–36.0)
MCV: 86.2 fl (ref 78.0–100.0)
Monocytes Absolute: 0.6 10*3/uL (ref 0.1–1.0)
Monocytes Relative: 7.9 % (ref 3.0–12.0)
Neutro Abs: 4.7 10*3/uL (ref 1.4–7.7)
Neutrophils Relative %: 59.8 % (ref 43.0–77.0)
Platelets: 260 10*3/uL (ref 150.0–400.0)
RBC: 5.13 Mil/uL — ABNORMAL HIGH (ref 3.87–5.11)
RDW: 13.5 % (ref 11.5–15.5)
WBC: 7.9 10*3/uL (ref 4.0–10.5)

## 2022-09-10 LAB — COMPREHENSIVE METABOLIC PANEL
ALT: 11 U/L (ref 0–35)
AST: 11 U/L (ref 0–37)
Albumin: 4.2 g/dL (ref 3.5–5.2)
Alkaline Phosphatase: 107 U/L (ref 39–117)
BUN: 13 mg/dL (ref 6–23)
CO2: 27 mEq/L (ref 19–32)
Calcium: 9.3 mg/dL (ref 8.4–10.5)
Chloride: 103 mEq/L (ref 96–112)
Creatinine, Ser: 1.02 mg/dL (ref 0.40–1.20)
GFR: 62.94 mL/min (ref >60.00)
Glucose, Bld: 81 mg/dL (ref 70–99)
Potassium: 3.9 mEq/L (ref 3.5–5.1)
Sodium: 140 mEq/L (ref 135–145)
Total Bilirubin: 0.3 mg/dL (ref 0.2–1.2)
Total Protein: 7.4 g/dL (ref 6.0–8.3)

## 2022-09-10 MED ORDER — SUTAB 1479-225-188 MG PO TABS: ORAL_TABLET | ORAL | 0 refills | Status: DC

## 2022-09-10 NOTE — Patient Instructions (Addendum)
Your provider has requested that you go to the basement level for lab work before leaving today. Press "B" on the elevator. The lab is located at the first door on the left as you exit the elevator.  You have been scheduled for an abdominal ultrasound at [facility] (1st floor of hospital) on 09/12/2022 at 7:00AM. Please arrive 30 minutes prior to your appointment for registration. Make certain not to have anything to eat or drink 6 hours prior to your appointment. Should you need to reschedule your appointment, please contact radiology at [phone]. This test typically takes about 30 minutes to perform.  You have been scheduled for a colonoscopy. Please follow written instructions given to you at your visit today.   Please pick up your prep supplies at the pharmacy within the next 1-3 days.  If you use inhalers (even only as needed), please bring them with you on the day of your procedure.  DO NOT TAKE 7 DAYS PRIOR TO TEST- Trulicity (dulaglutide) Ozempic, Wegovy (semaglutide) Mounjaro (tirzepatide) Bydureon Bcise (exanatide extended release)  DO NOT TAKE 1 DAY PRIOR TO YOUR TEST Rybelsus (semaglutide) Adlyxin (lixisenatide) Victoza (liraglutide) Byetta (exanatide) ___________________________________________________________________________  Please take your proton pump inhibitor medication, INCREASE PROTONIX TO TWICE A DAY FOR AT LEAST A MONTH  Please take this medication 30 minutes to 1 hour before meals- this makes it more effective.  Avoid spicy and acidic foods Avoid fatty foods Limit your intake of coffee, tea, alcohol, and carbonated drinks Work to maintain a healthy weight Keep the head of the bed elevated at least 3 inches with blocks or a wedge pillow if you are having any nighttime symptoms Stay upright for 2 hours after eating Avoid meals and snacks three to four hours before bedtime     FODMAP stands for fermentable oligo-, di-, mono-saccharides and polyols  (1). These are the scientific terms used to classify groups of carbs that are difficult for our body to digest and that are notorious for triggering digestive symptoms like bloating, gas, loose stools and stomach pain.   You can try low FODMAP diet  - start with eliminating just one column at a time that you feel may be a trigger for you. - the table at the very bottom contains foods that are low in FODMAPs   Sometimes trying to eliminate the FODMAP's from your diet is difficult or tricky, if you are stuggling with trying to do the elimination diet you can try an enzyme.  There is a food enzymes that you sprinkle in or on your food that helps break down the FODMAP. You can read more about the enzyme by going to this site: https://fodzyme.com/    _______________________________________________________  If your blood pressure at your visit was 140/90 or greater, please contact your primary care physician to follow up on this.  _______________________________________________________  If you are age 53 or older, your body mass index should be between 23-30. Your Body mass index is 45.17 kg/m. If this is out of the aforementioned range listed, please consider follow up with your Primary Care Provider.  If you are age 53 or younger, your body mass index should be between 19-25. Your Body mass index is 45.17 kg/m. If this is out of the aformentioned range listed, please consider follow up with your Primary Care Provider.   ________________________________________________________  The [facility] providers would like to encourage you to use [facility] to communicate with providers for non-urgent requests or questions.  Due to long hold  times on the telephone, sending your provider a message by [facility] may be a faster and more efficient way to get a response.  Please allow 48 business hours for a response.  Please remember that this is for non-urgent requests.   _______________________________________________________ It was a pleasure to see you today!  Thank you for trusting me with your gastrointestinal care!

## 2022-09-12 ENCOUNTER — Ambulatory Visit (HOSPITAL_COMMUNITY): Admission: RE | Admit: 2022-09-12 | Payer: BC Managed Care – PPO | Source: Ambulatory Visit

## 2022-09-16 ENCOUNTER — Ambulatory Visit (HOSPITAL_COMMUNITY)
Admission: RE | Admit: 2022-09-16 | Discharge: 2022-09-16 | Disposition: A | Discharge status: Home / Self Care | Payer: BC Managed Care – PPO | Source: Ambulatory Visit | Attending: Physician Assistant | Admitting: Physician Assistant

## 2022-09-16 DIAGNOSIS — R140 Abdominal distension (gaseous): Secondary | ICD-10-CM | POA: Diagnosis not present

## 2022-09-16 DIAGNOSIS — R1011 Right upper quadrant pain: Secondary | ICD-10-CM | POA: Diagnosis not present

## 2022-09-16 DIAGNOSIS — K760 Fatty (change of) liver, not elsewhere classified: Secondary | ICD-10-CM | POA: Diagnosis not present

## 2022-09-16 DIAGNOSIS — K219 Gastro-esophageal reflux disease without esophagitis: Secondary | ICD-10-CM | POA: Diagnosis not present

## 2022-11-08 ENCOUNTER — Encounter: Payer: BC Managed Care – PPO | Admitting: Internal Medicine

## 2022-12-10 ENCOUNTER — Encounter: Payer: Self-pay | Admitting: Internal Medicine

## 2022-12-24 ENCOUNTER — Encounter: Payer: Self-pay | Admitting: Internal Medicine

## 2022-12-24 ENCOUNTER — Ambulatory Visit: Payer: BC Managed Care – PPO | Admitting: Internal Medicine

## 2022-12-24 VITALS — BP 123/58 | HR 59 | Temp 98.1°F | Resp 17 | Ht 63.0 in | Wt 255.0 lb

## 2022-12-24 DIAGNOSIS — R1310 Dysphagia, unspecified: Secondary | ICD-10-CM | POA: Diagnosis not present

## 2022-12-24 DIAGNOSIS — K3189 Other diseases of stomach and duodenum: Secondary | ICD-10-CM

## 2022-12-24 DIAGNOSIS — K2100 Gastro-esophageal reflux disease with esophagitis, without bleeding: Secondary | ICD-10-CM

## 2022-12-24 DIAGNOSIS — Z860101 Personal history of adenomatous and serrated colon polyps: Secondary | ICD-10-CM

## 2022-12-24 DIAGNOSIS — K2900 Acute gastritis without bleeding: Secondary | ICD-10-CM

## 2022-12-24 DIAGNOSIS — Z1211 Encounter for screening for malignant neoplasm of colon: Secondary | ICD-10-CM

## 2022-12-24 DIAGNOSIS — K635 Polyp of colon: Secondary | ICD-10-CM | POA: Diagnosis not present

## 2022-12-24 DIAGNOSIS — R1319 Other dysphagia: Secondary | ICD-10-CM

## 2022-12-24 DIAGNOSIS — K5730 Diverticulosis of large intestine without perforation or abscess without bleeding: Secondary | ICD-10-CM | POA: Diagnosis not present

## 2022-12-24 DIAGNOSIS — K219 Gastro-esophageal reflux disease without esophagitis: Secondary | ICD-10-CM

## 2022-12-24 MED ORDER — SODIUM CHLORIDE 0.9 % IV SOLN: 500.0000 mL | Freq: Once | INTRAVENOUS | Status: DC

## 2022-12-24 NOTE — Progress Notes (Signed)
Called to room to assist during endoscopic procedure.  Patient ID and intended procedure confirmed with present staff. Received instructions for my participation in the procedure from the performing physician.  

## 2022-12-24 NOTE — Progress Notes (Signed)
GASTROENTEROLOGY PROCEDURE H&P NOTE   Primary Care Physician: Natale Milch, MD    Reason for Procedure:  GERD, dysphagia and history of colon polyps  Plan:    Upper and lower endoscopy  Patient is appropriate for endoscopic procedure(s) in the ambulatory ([facility]) setting.  The nature of the procedure, as well as the risks, benefits, and alternatives were carefully and thoroughly reviewed with the patient. Ample time for discussion and questions allowed. The patient understood, was satisfied, and agreed to proceed.     HPI: Bridget Beck is a 52 y.o. female who presents for EGD and colonoscopy.  Medical history as below.  Tolerated the prep.  No recent chest pain or shortness of breath.  No abdominal pain today.  Past Medical History:  Diagnosis Date   Anxiety    Arthritis    Depression    Managed   GERD (gastroesophageal reflux disease)    Hemorrhoids    Migraines    with aura   Stroke (HCC) 2023    Past Surgical History:  Procedure Laterality Date   CERVICAL FUSION     x 2   INCONTINENCE SURGERY  2004   TUBAL LIGATION  2002    Prior to Admission medications   Medication Sig Start Date End Date Taking? Authorizing Provider  aspirin EC 81 MG tablet Take 81 mg by mouth daily. Swallow whole.   Yes [provider]  atorvastatin (LIPITOR) 80 MG tablet Take 80 mg by mouth daily.   Yes [provider]  busPIRone (BUSPAR) 30 MG tablet Take 30 mg by mouth 2 (two) times daily. 01/05/19  Yes [provider]  DULoxetine (CYMBALTA) 60 MG capsule Take 60 mg by mouth 2 (two) times daily. 12/30/17  Yes [provider]  pregabalin (LYRICA) 200 MG capsule Take by mouth. Take 2 capsules in the morning and 1 at night 03/04/21  Yes [provider]  topiramate (TOPAMAX) 50 MG tablet Take 50 mg by mouth at bedtime. 01/04/19  Yes [provider]  Erenumab-aooe 70 MG/ML SOAJ Inject 70 mg into the skin every 28 (twenty-eight) days.  11/13/21   [provider]  NURTEC 75 MG TBDP Take 1 tablet by mouth daily as needed.    [provider]  ondansetron (ZOFRAN) 24 MG tablet Take 24 mg by mouth daily as needed for nausea.    [provider]  ondansetron (ZOFRAN-ODT) 8 MG disintegrating tablet Take 8 mg by mouth every 8 (eight) hours as needed. 09/13/22   [provider]  pantoprazole (PROTONIX) 40 MG tablet TK 1 T PO BID 30 MINUTES BEFORE MEALS 11/12/17   [provider]  valACYclovir (VALTREX) 1000 MG tablet Take 2 tablets (2,000 mg total) by mouth 2 (two) times daily. For 1 day.  Repeat as needed with future episodes. Patient taking differently: Take 2,000 mg by mouth as needed. For 1 day.  Repeat as needed with future episodes. 05/25/12   Joaquim Nam, MD    Current Outpatient Medications  Medication Sig Dispense Refill   aspirin EC 81 MG tablet Take 81 mg by mouth daily. Swallow whole.     atorvastatin (LIPITOR) 80 MG tablet Take 80 mg by mouth daily.     busPIRone (BUSPAR) 30 MG tablet Take 30 mg by mouth 2 (two) times daily.     DULoxetine (CYMBALTA) 60 MG capsule Take 60 mg by mouth 2 (two) times daily.  10   pregabalin (LYRICA) 200 MG capsule Take by  mouth. Take 2 capsules in the morning and 1 at night     topiramate (TOPAMAX) 50 MG tablet Take 50 mg by mouth at bedtime.     Erenumab-aooe 70 MG/ML SOAJ Inject 70 mg into the skin every 28 (twenty-eight) days.     NURTEC 75 MG TBDP Take 1 tablet by mouth daily as needed.     ondansetron (ZOFRAN) 24 MG tablet Take 24 mg by mouth daily as needed for nausea.     ondansetron (ZOFRAN-ODT) 8 MG disintegrating tablet Take 8 mg by mouth every 8 (eight) hours as needed.     pantoprazole (PROTONIX) 40 MG tablet TK 1 T PO BID 30 MINUTES BEFORE MEALS  0   valACYclovir (VALTREX) 1000 MG tablet Take 2 tablets (2,000 mg total) by mouth 2 (two) times daily. For 1 day.  Repeat as needed with future episodes. (Patient taking differently: Take  2,000 mg by mouth as needed. For 1 day.  Repeat as needed with future episodes.) 8 tablet 1   Current Facility-Administered Medications  Medication Dose Route Frequency Provider Last Rate Last Admin   0.9 %  sodium chloride infusion  500 mL Intravenous Continuous Yatzil Clippinger, Carie Caddy, MD       0.9 %  sodium chloride infusion  500 mL Intravenous Once Tywaun Hiltner, Carie Caddy, MD       levonorgestrel (MIRENA) 20 MCG/24HR IUD   Intrauterine Once Schuman, Christanna R, MD       triamcinolone acetonide (KENALOG-40) injection 20 mg  20 mg Other Once Asencion Islam, [name]        Allergies as of 12/24/2022 - Review Complete 12/24/2022  Allergen Reaction Noted   Buprenorphine Swelling 09/28/2019   Pramipexole Other (See Comments) 04/24/2017   Amitriptyline Other (See Comments) 10/23/2012   Fluoxetine hcl Other (See Comments) 10/17/2011   Methocarbamol Other (See Comments) 12/29/2013   Mirtazapine Other (See Comments) 10/17/2011   Prozac [fluoxetine hcl]  10/17/2011    Family History  Problem Relation Age of Onset   Diabetes Mother        type II, diet controlled   Liver disease Mother    Peripheral vascular disease Father    Bone cancer Father    Stroke Sister        possible CVA   Heart disease Maternal Grandmother    Cancer Maternal Aunt        non hodgkins lymphoma   Breast cancer Maternal Aunt     Social History   Socioeconomic History   Marital status: Married    Spouse name: Not on file   Number of children: 3   Years of education: Not on file   Highest education level: Not on file  Occupational History   Occupation: CMA    Employer:  [employer]  Tobacco Use   Smoking status: Former    Current packs/day: 0.00    Average packs/day: 0.1 packs/day for 3.0 years (0.3 ttl pk-yrs)    Types: Cigarettes    Start date: 10/28/2009    Quit date: 10/28/2012    Years since quitting: 10.1   Smokeless tobacco: Never  Vaping Use   Vaping status: Never Used  Substance and Sexual Activity    Alcohol use: No    Alcohol/week: 0.0 standard drinks of alcohol   Drug use: No   Sexual activity: Yes    Partners: Male    Birth control/protection: Surgical    Comment: Tubal  Other Topics Concern   Not on file  Social History Narrative   2 years of college.   Former CMA at [employer]   1st husband died from [employer] in 2011-01-18   Remarried    3 kids   Social Determinants of Health   Financial Resource Strain: High Risk (03/09/2021)   Received from [facility], [facility]   Overall Financial Resource Strain (CARDIA)    Difficulty of Paying Living Expenses: Hard  Food Insecurity: No Food Insecurity (03/09/2021)   Received from [facility], [facility]   Hunger Vital Sign    Worried About Running Out of Food in the Last Year: Never true    Ran Out of Food in the Last Year: Never true  Transportation Needs: No Transportation Needs (03/09/2021)   Received from [facility], [facility]   [facility] - Transportation    Lack of Transportation (Medical): No    Lack of Transportation (Non-Medical): No  Physical Activity: Not on file  Stress: Not on file  Social Connections: Not on file  Intimate Partner Violence: Not on file    Physical Exam: Vital signs in last 24 hours: @BP  122/64   Pulse 64   Temp 98.1 F (36.7 C)   Ht 5\' 3"  (1.6 m)   Wt 255 lb (115.7 kg)   SpO2 97%   BMI 45.17 kg/m  GEN: NAD EYE: Sclerae anicteric ENT: MMM CV: Non-tachycardic Pulm: CTA b/l GI: Soft, NT/ND NEURO:  Alert & Oriented x 3   Erick Blinks, MD [facility]  12/24/2022 2:50 PM

## 2022-12-24 NOTE — Progress Notes (Deleted)
Pt's states no medical or surgical changes since previsit or office visit. 

## 2022-12-24 NOTE — Op Note (Signed)
[facility] Patient Name: Bridget Beck Procedure Date: 12/24/2022 2:47 PM MRN: [id number] Endoscopist: Beverley Fiedler , MD, [id number] Age: 52 Referring MD:  Date of Birth: [birth year] Gender: Female Account #: [id number] Procedure:                Colonoscopy Indications:              High risk colon cancer surveillance: Personal                            history of non-advanced adenoma and SSP, Last                            colonoscopy: April 2018 (SSP x 1, TA x 1) Medicines:                Monitored Anesthesia Care Procedure:                Pre-Anesthesia Assessment:                           - Prior to the procedure, a History and Physical                            was performed, and patient medications and                            allergies were reviewed. The patient's tolerance of                            previous anesthesia was also reviewed. The risks                            and benefits of the procedure and the sedation                            options and risks were discussed with the patient.                            All questions were answered, and informed consent                            was obtained. Prior Anticoagulants: The patient has                            taken no anticoagulant or antiplatelet agents. ASA                            Grade Assessment: III - A patient with severe                            systemic disease. After reviewing the risks and                            benefits, the patient was deemed in satisfactory  condition to undergo the procedure.                           After obtaining informed consent, the colonoscope                            was passed under direct vision. Throughout the                            procedure, the patient's blood pressure, pulse, and                            oxygen saturations were monitored continuously. The                            CF [id number] #[id number]  was introduced through the anus                            and advanced to the cecum, identified by                            appendiceal orifice and ileocecal valve. The                            colonoscopy was performed without difficulty. The                            patient tolerated the procedure well. The quality                            of the bowel preparation was good. The ileocecal                            valve, appendiceal orifice, and rectum were                            photographed. Scope In: 3:12:17 PM Scope Out: 3:30:45 PM Scope Withdrawal Time: 0 hours 14 minutes 52 seconds  Total Procedure Duration: 0 hours 18 minutes 28 seconds  Findings:                 The digital rectal exam was normal.                           Multiple small-mouthed diverticula were found in                            the sigmoid colon.                           The exam was otherwise without abnormality on                            direct and retroflexion views. Complications:            No immediate complications. Estimated Blood Loss:  Estimated blood loss: none. Impression:               - Mild diverticulosis in the sigmoid colon.                           - The examination was otherwise normal on direct                            and retroflexion views.                           - No specimens collected. Recommendation:           - Patient has a contact number available for                            emergencies. The signs and symptoms of potential                            delayed complications were discussed with the                            patient. Return to normal activities tomorrow.                            Written discharge instructions were provided to the                            patient.                           - Resume previous diet.                           - Continue present medications.                           - Repeat colonoscopy in 10 years for  surveillance. Beverley Fiedler, MD 12/24/2022 3:47:00 PM This report has been signed electronically.

## 2022-12-24 NOTE — Progress Notes (Signed)
I have reviewed the patient's medical history in detail and updated the computerized patient record.

## 2022-12-24 NOTE — Patient Instructions (Signed)
Resume previous diet and medications. Awaiting pathology results. Repeat Colonoscopy in 10 years for surveillance.  YOU HAD AN ENDOSCOPIC PROCEDURE TODAY AT THE [facility]:   Refer to the procedure report that was given to you for any specific questions about what was found during the examination.  If the procedure report does not answer your questions, please call your gastroenterologist to clarify.  If you requested that your care partner not be given the details of your procedure findings, then the procedure report has been included in a sealed envelope for you to review at your convenience later.  YOU SHOULD EXPECT: Some feelings of bloating in the abdomen. Passage of more gas than usual.  Walking can help get rid of the air that was put into your GI tract during the procedure and reduce the bloating. If you had a lower endoscopy (such as a colonoscopy or flexible sigmoidoscopy) you may notice spotting of blood in your stool or on the toilet paper. If you underwent a bowel prep for your procedure, you may not have a normal bowel movement for a few days.  Please Note:  You might notice some irritation and congestion in your nose or some drainage.  This is from the oxygen used during your procedure.  There is no need for concern and it should clear up in a day or so.  SYMPTOMS TO REPORT IMMEDIATELY:  Following lower endoscopy (colonoscopy or flexible sigmoidoscopy):  Excessive amounts of blood in the stool  Significant tenderness or worsening of abdominal pains  Swelling of the abdomen that is new, acute  Fever of 100F or higher  Following upper endoscopy (EGD)  Vomiting of blood or coffee ground material  New chest pain or pain under the shoulder blades  Painful or persistently difficult swallowing  New shortness of breath  Fever of 100F or higher  Black, tarry-looking stools  For urgent or emergent issues, a gastroenterologist can be reached at any hour by calling [phone]  [phone]. Do not use MyChart messaging for urgent concerns.    DIET:  We do recommend a small meal at first, but then you may proceed to your regular diet.  Drink plenty of fluids but you should avoid alcoholic beverages for 24 hours.  ACTIVITY:  You should plan to take it easy for the rest of today and you should NOT DRIVE or use heavy machinery until tomorrow (because of the sedation medicines used during the test).    FOLLOW UP: Our staff will call the number listed on your records the next business day following your procedure.  We will call around 7:15- 8:00 am to check on you and address any questions or concerns that you may have regarding the information given to you following your procedure. If we do not reach you, we will leave a message.     If any biopsies were taken you will be contacted by phone or by letter within the next 1-3 weeks.  Please call us at [phone] if you have not heard about the biopsies in 3 weeks.    SIGNATURES/CONFIDENTIALITY: You and/or your care partner have signed paperwork which will be entered into your electronic medical record.  These signatures attest to the fact that that the information above on your After Visit Summary has been reviewed and is understood.  Full responsibility of the confidentiality of this discharge information lies with you and/or your care-partner.

## 2022-12-24 NOTE — Op Note (Signed)
[facility] Patient Name: Ramie Bettinger Procedure Date: 12/24/2022 2:54 PM MRN: [id number] Endoscopist: Beverley Fiedler , MD, [id number] Age: 52 Referring MD:  Date of Birth: 07-Aug-1970 Gender: Female Account #: [id number] Procedure:                Upper GI endoscopy Indications:              Epigastric abdominal pain, Dysphagia,                            Gastro-esophageal reflux disease, current therapy                            pantoprazole 40 mg BID Medicines:                Monitored Anesthesia Care Procedure:                Pre-Anesthesia Assessment:                           - Prior to the procedure, a History and Physical                            was performed, and patient medications and                            allergies were reviewed. The patient's tolerance of                            previous anesthesia was also reviewed. The risks                            and benefits of the procedure and the sedation                            options and risks were discussed with the patient.                            All questions were answered, and informed consent                            was obtained. Prior Anticoagulants: The patient has                            taken no anticoagulant or antiplatelet agents. ASA                            Grade Assessment: III - A patient with severe                            systemic disease. After reviewing the risks and                            benefits, the patient was deemed in satisfactory  condition to undergo the procedure.                           After obtaining informed consent, the endoscope was                            passed under direct vision. Throughout the                            procedure, the patient's blood pressure, pulse, and                            oxygen saturations were monitored continuously. The                            Olympus Scope [id number] was introduced  through the                            mouth, and advanced to the second part of duodenum.                            The upper GI endoscopy was accomplished without                            difficulty. The patient tolerated the procedure                            well. Scope In: Scope Out: Findings:                 LA Grade A (one or more mucosal breaks less than 5                            mm, not extending between tops of 2 mucosal folds)                            esophagitis was found at the gastroesophageal                            junction.                           No endoscopic abnormality was evident in the                            esophagus to explain the patient's complaint of                            dysphagia. It was decided, however, to proceed with                            dilation of the entire esophagus. The scope was                            withdrawn. Dilation was  performed with a Maloney                            dilator with mild resistance at 54 Fr.                           Segmental severe inflammation characterized by                            congestion (edema), erosions, friability,                            granularity and shallow ulcerations was found in                            the gastric antrum. Biopsies were taken with a cold                            forceps for Helicobacter pylori testing.                           The examined duodenum was normal. Complications:            No immediate complications. Estimated Blood Loss:     Estimated blood loss was minimal. Impression:               - LA Grade A reflux esophagitis.                           - No endoscopic esophageal abnormality to explain                            patient's dysphagia. Esophagus dilated with 54 Fr                            Maloney.                           - Acute gastritis. Biopsied.                           - Normal examined duodenum. Recommendation:            - Patient has a contact number available for                            emergencies. The signs and symptoms of potential                            delayed complications were discussed with the                            patient. Return to normal activities tomorrow.                            Written discharge instructions were provided to the  patient.                           - Resume previous diet.                           - Continue present medications.                           - Await pathology results.                           - See the other procedure note for documentation of                            additional recommendations. Beverley Fiedler, MD 12/24/2022 3:45:20 PM This report has been signed electronically.

## 2022-12-24 NOTE — Progress Notes (Signed)
Sedate, gd SR, tolerated procedure well, VSS, report to RN 

## 2022-12-25 ENCOUNTER — Telehealth: Payer: Self-pay

## 2022-12-25 NOTE — Telephone Encounter (Signed)
  Follow up Call-     12/24/2022    1:58 PM  Call back number  Post procedure Call Back phone  # [phone]  Permission to leave phone message Yes     Patient questions:  Do you have a fever, pain , or abdominal swelling? No. Pain Score  0 *  Have you tolerated food without any problems? Yes.    Have you been able to return to your normal activities? Yes.    Do you have any questions about your discharge instructions: Diet   No. Medications  No. Follow up visit  No.  Do you have questions or concerns about your Care? No.  Actions: * If pain score is 4 or above: No action needed, pain <4.

## 2022-12-31 LAB — SURGICAL PATHOLOGY

## 2023-01-03 ENCOUNTER — Encounter: Payer: Self-pay | Admitting: Internal Medicine
